# Patient Record
Sex: Female | Born: 1965 | Race: White | Hispanic: No | Marital: Married | Smoking: Current every day smoker
Health system: Southern US, Community
[De-identification: ages and names within clinical notes are randomized; demographics above are authoritative.]

## PROBLEM LIST (undated history)

## (undated) DIAGNOSIS — E119 Type 2 diabetes mellitus without complications: Secondary | ICD-10-CM

## (undated) DIAGNOSIS — E669 Obesity, unspecified: Secondary | ICD-10-CM

## (undated) DIAGNOSIS — Z9289 Personal history of other medical treatment: Secondary | ICD-10-CM

## (undated) DIAGNOSIS — I10 Essential (primary) hypertension: Secondary | ICD-10-CM

## (undated) DIAGNOSIS — Z0100 Encounter for examination of eyes and vision without abnormal findings: Secondary | ICD-10-CM

## (undated) DIAGNOSIS — E039 Hypothyroidism, unspecified: Secondary | ICD-10-CM

## (undated) DIAGNOSIS — E079 Disorder of thyroid, unspecified: Secondary | ICD-10-CM

## (undated) DIAGNOSIS — G4730 Sleep apnea, unspecified: Secondary | ICD-10-CM

## (undated) DIAGNOSIS — B019 Varicella without complication: Secondary | ICD-10-CM

## (undated) DIAGNOSIS — M1990 Unspecified osteoarthritis, unspecified site: Secondary | ICD-10-CM

## (undated) DIAGNOSIS — E785 Hyperlipidemia, unspecified: Secondary | ICD-10-CM

## (undated) HISTORY — DX: Hypothyroidism, unspecified: E03.9

## (undated) HISTORY — DX: Essential (primary) hypertension: I10

## (undated) HISTORY — DX: Obesity, unspecified: E66.9

## (undated) HISTORY — DX: Sleep apnea, unspecified: G47.30

## (undated) HISTORY — DX: Unspecified osteoarthritis, unspecified site: M19.90

## (undated) HISTORY — DX: Encounter for examination of eyes and vision without abnormal findings: Z01.00

## (undated) HISTORY — DX: Varicella without complication: B01.9

## (undated) HISTORY — DX: Type 2 diabetes mellitus without complications: E11.9

## (undated) HISTORY — DX: Hyperlipidemia, unspecified: E78.5

## (undated) HISTORY — PX: APPENDECTOMY: SHX54

## (undated) HISTORY — DX: Personal history of other medical treatment: Z92.89

---

## 2005-01-30 ENCOUNTER — Inpatient Hospital Stay: Payer: Self-pay | Admitting: Vascular Surgery

## 2005-07-28 ENCOUNTER — Ambulatory Visit: Payer: Self-pay | Admitting: Internal Medicine

## 2005-08-23 ENCOUNTER — Ambulatory Visit: Payer: Self-pay | Admitting: Internal Medicine

## 2005-09-13 ENCOUNTER — Ambulatory Visit: Payer: Self-pay | Admitting: Internal Medicine

## 2005-09-25 ENCOUNTER — Ambulatory Visit: Payer: Self-pay | Admitting: Internal Medicine

## 2005-11-30 ENCOUNTER — Ambulatory Visit: Payer: Self-pay | Admitting: Internal Medicine

## 2005-11-30 ENCOUNTER — Other Ambulatory Visit: Admission: RE | Admit: 2005-11-30 | Discharge: 2005-11-30 | Payer: Self-pay | Admitting: Internal Medicine

## 2005-11-30 LAB — CONVERTED CEMR LAB: Pap Smear: NORMAL

## 2005-12-01 ENCOUNTER — Encounter: Payer: Self-pay | Admitting: Internal Medicine

## 2006-01-05 ENCOUNTER — Ambulatory Visit: Payer: Self-pay | Admitting: Internal Medicine

## 2006-02-28 ENCOUNTER — Ambulatory Visit: Payer: Self-pay | Admitting: Internal Medicine

## 2006-03-29 ENCOUNTER — Ambulatory Visit: Payer: Self-pay | Admitting: Internal Medicine

## 2006-07-17 ENCOUNTER — Encounter (INDEPENDENT_AMBULATORY_CARE_PROVIDER_SITE_OTHER): Payer: Self-pay | Admitting: *Deleted

## 2006-07-17 LAB — CONVERTED CEMR LAB: Pap Smear: NORMAL

## 2006-08-13 ENCOUNTER — Emergency Department: Payer: Self-pay | Admitting: Emergency Medicine

## 2006-08-15 ENCOUNTER — Encounter: Payer: Self-pay | Admitting: Internal Medicine

## 2006-08-15 DIAGNOSIS — G4733 Obstructive sleep apnea (adult) (pediatric): Secondary | ICD-10-CM

## 2006-08-15 DIAGNOSIS — E039 Hypothyroidism, unspecified: Secondary | ICD-10-CM | POA: Insufficient documentation

## 2006-08-15 DIAGNOSIS — I10 Essential (primary) hypertension: Secondary | ICD-10-CM | POA: Insufficient documentation

## 2006-08-15 HISTORY — DX: Obstructive sleep apnea (adult) (pediatric): G47.33

## 2006-12-11 ENCOUNTER — Ambulatory Visit: Payer: Self-pay | Admitting: Family Medicine

## 2006-12-11 DIAGNOSIS — K5900 Constipation, unspecified: Secondary | ICD-10-CM | POA: Insufficient documentation

## 2007-01-14 ENCOUNTER — Ambulatory Visit: Payer: Self-pay | Admitting: Internal Medicine

## 2007-01-14 DIAGNOSIS — M1990 Unspecified osteoarthritis, unspecified site: Secondary | ICD-10-CM | POA: Insufficient documentation

## 2007-03-22 ENCOUNTER — Ambulatory Visit: Payer: Self-pay | Admitting: Internal Medicine

## 2007-03-22 DIAGNOSIS — E785 Hyperlipidemia, unspecified: Secondary | ICD-10-CM | POA: Insufficient documentation

## 2007-03-27 ENCOUNTER — Ambulatory Visit: Payer: Self-pay | Admitting: Internal Medicine

## 2007-04-04 ENCOUNTER — Ambulatory Visit: Payer: Self-pay | Admitting: Family Medicine

## 2007-04-05 ENCOUNTER — Encounter (INDEPENDENT_AMBULATORY_CARE_PROVIDER_SITE_OTHER): Payer: Self-pay | Admitting: Internal Medicine

## 2007-04-05 ENCOUNTER — Telehealth (INDEPENDENT_AMBULATORY_CARE_PROVIDER_SITE_OTHER): Payer: Self-pay | Admitting: Internal Medicine

## 2007-07-15 ENCOUNTER — Other Ambulatory Visit: Admission: RE | Admit: 2007-07-15 | Discharge: 2007-07-15 | Payer: Self-pay | Admitting: Internal Medicine

## 2007-07-15 ENCOUNTER — Encounter: Payer: Self-pay | Admitting: Internal Medicine

## 2007-07-15 ENCOUNTER — Ambulatory Visit: Payer: Self-pay | Admitting: Internal Medicine

## 2007-07-17 LAB — CONVERTED CEMR LAB
ALT: 27 units/L (ref 0–35)
AST: 20 units/L (ref 0–37)
Albumin: 4 g/dL (ref 3.5–5.2)
BUN: 11 mg/dL (ref 6–23)
Basophils Absolute: 0.6 10*3/uL — ABNORMAL HIGH (ref 0.0–0.1)
Basophils Relative: 5.5 % — ABNORMAL HIGH (ref 0.0–1.0)
Bilirubin, Direct: 0.1 mg/dL (ref 0.0–0.3)
CO2: 28 meq/L (ref 19–32)
Creatinine, Ser: 0.6 mg/dL (ref 0.4–1.2)
Creatinine,U: 34.8 mg/dL
Free T4: 0.6 ng/dL (ref 0.6–1.6)
HCT: 42.8 % (ref 36.0–46.0)
Hemoglobin: 13.9 g/dL (ref 12.0–15.0)
Hgb A1c MFr Bld: 6.9 % — ABNORMAL HIGH (ref 4.6–6.0)
Monocytes Absolute: 0.5 10*3/uL (ref 0.2–0.7)
Neutrophils Relative %: 55.5 % (ref 43.0–77.0)
Phosphorus: 4.2 mg/dL (ref 2.3–4.6)
Potassium: 3.6 meq/L (ref 3.5–5.1)
RBC: 5.06 M/uL (ref 3.87–5.11)
RDW: 16.3 % — ABNORMAL HIGH (ref 11.5–14.6)
Sodium: 137 meq/L (ref 135–145)
TSH: 5.64 microintl units/mL — ABNORMAL HIGH (ref 0.35–5.50)
Total Bilirubin: 0.4 mg/dL (ref 0.3–1.2)
Total CHOL/HDL Ratio: 6.1
Triglycerides: 142 mg/dL (ref 0–149)
VLDL: 28 mg/dL (ref 0–40)

## 2007-07-18 ENCOUNTER — Encounter (INDEPENDENT_AMBULATORY_CARE_PROVIDER_SITE_OTHER): Payer: Self-pay | Admitting: *Deleted

## 2007-08-08 ENCOUNTER — Telehealth (INDEPENDENT_AMBULATORY_CARE_PROVIDER_SITE_OTHER): Payer: Self-pay | Admitting: *Deleted

## 2007-09-02 ENCOUNTER — Ambulatory Visit: Payer: Self-pay | Admitting: Internal Medicine

## 2007-09-05 LAB — CONVERTED CEMR LAB
ALT: 28 units/L (ref 0–35)
Cholesterol: 158 mg/dL (ref 0–200)
Free T4: 0.9 ng/dL (ref 0.6–1.6)
TSH: 3.26 microintl units/mL (ref 0.35–5.50)

## 2007-09-16 ENCOUNTER — Telehealth (INDEPENDENT_AMBULATORY_CARE_PROVIDER_SITE_OTHER): Payer: Self-pay | Admitting: *Deleted

## 2007-12-04 ENCOUNTER — Encounter (INDEPENDENT_AMBULATORY_CARE_PROVIDER_SITE_OTHER): Payer: Self-pay | Admitting: *Deleted

## 2008-10-07 ENCOUNTER — Ambulatory Visit: Payer: Self-pay

## 2008-12-07 ENCOUNTER — Telehealth: Payer: Self-pay | Admitting: Internal Medicine

## 2008-12-07 ENCOUNTER — Ambulatory Visit: Payer: Self-pay | Admitting: Internal Medicine

## 2008-12-08 LAB — CONVERTED CEMR LAB
AST: 23 units/L (ref 0–37)
Albumin: 3.9 g/dL (ref 3.5–5.2)
Alkaline Phosphatase: 65 units/L (ref 39–117)
BUN: 19 mg/dL (ref 6–23)
Bilirubin, Direct: 0.1 mg/dL (ref 0.0–0.3)
CO2: 28 meq/L (ref 19–32)
Calcium: 9.7 mg/dL (ref 8.4–10.5)
Chloride: 105 meq/L (ref 96–112)
Cholesterol: 161 mg/dL (ref 0–200)
Creatinine, Ser: 0.6 mg/dL (ref 0.4–1.2)
Eosinophils Absolute: 0.3 10*3/uL (ref 0.0–0.7)
Eosinophils Relative: 2.7 % (ref 0.0–5.0)
Free T4: 0.8 ng/dL (ref 0.6–1.6)
HCT: 39.4 % (ref 36.0–46.0)
Hgb A1c MFr Bld: 6.2 % (ref 4.6–6.5)
LDL Cholesterol: 101 mg/dL — ABNORMAL HIGH (ref 0–99)
Lymphs Abs: 2.9 10*3/uL (ref 0.7–4.0)
MCHC: 35.1 g/dL (ref 30.0–36.0)
MCV: 85.9 fL (ref 78.0–100.0)
Microalb, Ur: 22.1 mg/dL — ABNORMAL HIGH (ref 0.0–1.9)
Monocytes Absolute: 0.1 10*3/uL (ref 0.1–1.0)
Platelets: 247 10*3/uL (ref 150.0–400.0)
TSH: 3.66 microintl units/mL (ref 0.35–5.50)
Total CHOL/HDL Ratio: 5
Total Protein: 7.7 g/dL (ref 6.0–8.3)
WBC: 11 10*3/uL — ABNORMAL HIGH (ref 4.5–10.5)

## 2009-05-10 ENCOUNTER — Telehealth: Payer: Self-pay | Admitting: Internal Medicine

## 2009-06-10 ENCOUNTER — Ambulatory Visit: Payer: Self-pay | Admitting: Internal Medicine

## 2009-06-10 ENCOUNTER — Other Ambulatory Visit: Admission: RE | Admit: 2009-06-10 | Discharge: 2009-06-10 | Payer: Self-pay | Admitting: Internal Medicine

## 2009-06-14 LAB — CONVERTED CEMR LAB
ALT: 37 units/L — ABNORMAL HIGH (ref 0–35)
Alkaline Phosphatase: 68 units/L (ref 39–117)
Basophils Relative: 0 % (ref 0.0–3.0)
Bilirubin, Direct: 0 mg/dL (ref 0.0–0.3)
Eosinophils Absolute: 0.2 10*3/uL (ref 0.0–0.7)
Eosinophils Relative: 2.5 % (ref 0.0–5.0)
Free T4: 0.7 ng/dL (ref 0.6–1.6)
Glucose, Bld: 123 mg/dL — ABNORMAL HIGH (ref 70–99)
Hgb A1c MFr Bld: 6.8 % — ABNORMAL HIGH (ref 4.6–6.5)
Lymphocytes Relative: 30.1 % (ref 12.0–46.0)
Monocytes Relative: 1.7 % — ABNORMAL LOW (ref 3.0–12.0)
Neutrophils Relative %: 65.7 % (ref 43.0–77.0)
Phosphorus: 4.5 mg/dL (ref 2.3–4.6)
Potassium: 3.7 meq/L (ref 3.5–5.1)
RBC: 4.51 M/uL (ref 3.87–5.11)
Sodium: 138 meq/L (ref 135–145)
Total Protein: 7 g/dL (ref 6.0–8.3)
WBC: 9.1 10*3/uL (ref 4.5–10.5)

## 2009-06-15 ENCOUNTER — Encounter: Payer: Self-pay | Admitting: Internal Medicine

## 2009-06-22 ENCOUNTER — Ambulatory Visit: Payer: Self-pay | Admitting: Family Medicine

## 2009-06-22 DIAGNOSIS — R079 Chest pain, unspecified: Secondary | ICD-10-CM | POA: Insufficient documentation

## 2009-10-21 ENCOUNTER — Ambulatory Visit: Payer: Self-pay | Admitting: Family Medicine

## 2009-10-27 ENCOUNTER — Ambulatory Visit: Payer: Self-pay | Admitting: Internal Medicine

## 2009-10-27 DIAGNOSIS — J449 Chronic obstructive pulmonary disease, unspecified: Secondary | ICD-10-CM | POA: Insufficient documentation

## 2009-11-02 ENCOUNTER — Ambulatory Visit: Payer: Self-pay | Admitting: Internal Medicine

## 2009-12-09 ENCOUNTER — Ambulatory Visit: Payer: Self-pay | Admitting: Internal Medicine

## 2009-12-10 LAB — CONVERTED CEMR LAB: Hgb A1c MFr Bld: 7.4 % — ABNORMAL HIGH (ref 4.6–6.5)

## 2010-05-19 ENCOUNTER — Ambulatory Visit
Admission: RE | Admit: 2010-05-19 | Discharge: 2010-05-19 | Payer: Self-pay | Source: Home / Self Care | Attending: Internal Medicine | Admitting: Internal Medicine

## 2010-05-19 DIAGNOSIS — R1011 Right upper quadrant pain: Secondary | ICD-10-CM | POA: Insufficient documentation

## 2010-05-19 LAB — CONVERTED CEMR LAB
Ketones, urine, test strip: NEGATIVE
Nitrite: NEGATIVE
Specific Gravity, Urine: 1.02

## 2010-05-22 HISTORY — PX: ABDOMINAL HYSTERECTOMY: SHX81

## 2010-05-24 ENCOUNTER — Ambulatory Visit: Payer: Self-pay | Admitting: Internal Medicine

## 2010-05-24 ENCOUNTER — Encounter: Payer: Self-pay | Admitting: Family Medicine

## 2010-06-13 ENCOUNTER — Ambulatory Visit
Admission: RE | Admit: 2010-06-13 | Discharge: 2010-06-13 | Payer: Self-pay | Source: Home / Self Care | Attending: Internal Medicine | Admitting: Internal Medicine

## 2010-06-13 ENCOUNTER — Other Ambulatory Visit: Payer: Self-pay | Admitting: Internal Medicine

## 2010-06-14 LAB — RENAL FUNCTION PANEL
Albumin: 4 g/dL (ref 3.5–5.2)
BUN: 15 mg/dL (ref 6–23)
Glucose, Bld: 122 mg/dL — ABNORMAL HIGH (ref 70–99)
Phosphorus: 4.3 mg/dL (ref 2.3–4.6)
Potassium: 4.2 mEq/L (ref 3.5–5.1)
Sodium: 140 mEq/L (ref 135–145)

## 2010-06-14 LAB — CBC WITH DIFFERENTIAL/PLATELET
Basophils Relative: 0.5 % (ref 0.0–3.0)
Lymphs Abs: 3 10*3/uL (ref 0.7–4.0)
MCHC: 34 g/dL (ref 30.0–36.0)
Monocytes Relative: 4.7 % (ref 3.0–12.0)
RBC: 4.55 Mil/uL (ref 3.87–5.11)
WBC: 9.3 10*3/uL (ref 4.5–10.5)

## 2010-06-14 LAB — LIPID PANEL
HDL: 38.5 mg/dL — ABNORMAL LOW (ref >39.00)
Triglycerides: 201 mg/dL — ABNORMAL HIGH (ref 0.0–149.0)
VLDL: 40.2 mg/dL — ABNORMAL HIGH (ref 0.0–40.0)

## 2010-06-14 LAB — HEPATIC FUNCTION PANEL
Total Bilirubin: 0.3 mg/dL (ref 0.3–1.2)
Total Protein: 6.8 g/dL (ref 6.0–8.3)

## 2010-06-21 NOTE — Assessment & Plan Note (Signed)
Summary: CPX/BIR   Vital Signs:  Patient profile:   45 year old female Weight:      205 pounds BMI:     40.52 Temp:     98.8 degrees F oral Pulse rate:   76 / minute Pulse rhythm:   regular BP sitting:   130 / 80  (left arm) Cuff size:   large  Vitals Entered By: Mervin Hack CMA Duncan Dull) (June 10, 2009 3:04 PM) CC: adult physical   History of Present Illness: Doing fairly well in general  Ran out of pravastatin While waiting for Rx, she was off for 1 week Chronic constipation which was better restarted the med, constipation returned discussed restarting but without the aspirin  Sugars generally under 100 fasting no hypoglycemia No sores or pain in feet  Allergies: No Known Drug Allergies  Past History:  Past medical, surgical, family and social histories (including risk factors) reviewed for relevance to current acute and chronic problems.  Past Medical History: Reviewed history from 12/07/2008 and no changes required. Hypertension Hypothyroidism Osteoarthritis NIDDM with nephropathy Hyperlipidemia Obstructive sleep apnea  Past Surgical History: Reviewed history from 08/15/2006 and no changes required. Appendectomy 9/06 C-Section x2 Obstipation 2004 Tubal ligation  Family History: Reviewed history from 08/15/2006 and no changes required. Dad has HTN Mom healthy Pat GF died@72  of lungcancer CAD in [place] DM Mat [place] and Mat aunts/uncles 1 brother/1 sister  Social History: Reviewed history from 01/14/2007 and no changes required. 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 Eyes:  Denies double vision and vision loss-1  eye. ENT:  Denies decreased hearing and ringing in ears; recent tooth pulled--otherwise okay. CV:  Complains of palpitations; denies chest pain or discomfort, difficulty breathing at night, difficulty breathing while lying down, fainting, lightheadness, and shortness of breath with exertion; occ palpitations--often in bed. Resp:  Denies cough and shortness of breath. GI:  Complains of constipation and indigestion; denies abdominal pain, bloody stools, dark tarry stools, nausea, and vomiting; occ mild upset stomach. GU:  Denies dysuria and incontinence; mild dyspareunia--better with lubricants. MS:  Complains of joint pain; denies joint swelling; mild shoulder pain  still--better since working out more. Derm:  Complains of rash; denies lesion(s); occ mild itchy reash on forearms. Neuro:  Denies headaches, numbness, tingling, and weakness. Psych:  Denies anxiety and depression. Heme:  Denies abnormal bruising and enlarge lymph nodes. Allergy:  Denies seasonal allergies and sneezing.  Physical Exam  General:  alert and normal appearance.   Eyes:  pupils equal, pupils round, pupils reactive to light, and no optic disk abnormalities.   Ears:  R ear normal and L ear normal.   Mouth:  no erythema and no lesions.   Neck:  supple, no masses, no thyromegaly, no carotid bruits, and no cervical lymphadenopathy.   Breasts:  no masses, no abnormal thickening, no tenderness, and no adenopathy.   Lungs:  normal respiratory effort and normal breath sounds.   Heart:  normal rate, regular rhythm, no murmur, and no gallop.   Abdomen:  soft, non-tender, and no masses.   Genitalia:  normal introitus, no external lesions, no vaginal or cervical lesions, and no friaility or hemorrhage.   Limited but negative bimanual exam Msk:  no joint tenderness and no joint swelling.   Pulses:  1+ in feet Extremities:  no edema Neurologic:  alert & oriented X3, strength normal in all extremities, and gait normal.   Skin:   no rashes and no suspicious lesions.   Psych:  normally interactive, good eye contact, not anxious appearing, and not depressed appearing.    Diabetes Management Exam:    Foot Exam (with socks and/or shoes not present):       Sensory-Pinprick/Light touch:          Left medial foot (L-4): normal          Left dorsal foot (L-5): normal          Left lateral foot (S-1): normal          Right medial foot (L-4): normal          Right dorsal foot (L-5): normal          Right lateral foot (S-1): normal       Inspection:          Left foot: abnormal             Comments: early callous          Right foot: abnormal             Comments: early callous       Nails:          Left foot: normal          Right foot: normal   Impression & Recommendations:  Problem # 1:  PREVENTIVE HEALTH CARE (ICD-V70.0) Assessment Comment Only working on fitness counselled prefers to wait on mammograms  Problem # 2:  DIABETES MELLITUS, TYPE II WITH NEPHROP 2001 (ICD-250.00) Assessment: Unchanged  seems to still be okay will check labs if urine microal worse, will increase lisinopril  Her updated medication list for this problem includes:    Lantus 100 Unit/ml Soln (Insulin glargine) ..... Inject 65 units  subcutaneously once a day    Lisinopril 10 Mg Tabs (Lisinopril) .Marland Kitchen... Take 1 tablet by mouth once a day    Metformin Hcl 500 Mg Tabs (Metformin hcl) .Marland Kitchen... 1 tab three times a day before meals  Labs Reviewed: Creat: 0.6 (12/07/2008)     Last Eye Exam: normal (11/23/2008) Reviewed HgBA1c results: 6.2 (12/07/2008)  6.9 (07/15/2007)  Orders: TLB-Microalbumin/Creat Ratio, Urine (82043-MALB) TLB-A1C / Hgb A1C (Glycohemoglobin) (83036-A1C)  Problem # 3:  HYPERTENSION (ICD-401.9) Assessment: Unchanged  may need increased lisinopril  Her updated medication list for this problem includes:    Lisinopril 10 Mg Tabs (Lisinopril) .Marland Kitchen... Take 1 tablet by mouth once a day    Hydrochlorothiazide 25 Mg  Tabs (Hydrochlorothiazide) .Marland Kitchen... 1 tab daily for high blood pressure  BP today: 130/80 Prior BP: 110/68 (12/07/2008)  Labs Reviewed: K+: 4.2 (12/07/2008) Creat: : 0.6 (12/07/2008)   Chol: 161 (12/07/2008)   HDL: 34.40 (12/07/2008)   LDL: 101 (12/07/2008)   TG: 127.0 (12/07/2008)  Orders: TLB-Renal Function Panel (80069-RENAL) TLB-CBC Platelet - w/Differential (85025-CBCD) TLB-Hepatic/Liver Function Pnl (80076-HEPATIC) Venipuncture ([address])  Problem # 4:  HYPERLIPIDEMIA (ICD-272.4) Assessment: Comment Only check next time is restarting the pravastatin  Her updated medication list for this problem includes:    Pravastatin Sodium 40 Mg Tabs (Pravastatin sodium) .Marland Kitchen... 1 tab daily  Complete Medication List: 1)  Levothyroxine Sodium 100 Mcg Tabs (Levothyroxine sodium) .... Take 1 tablet once a day 2)  Lantus 100 Unit/ml Soln (Insulin glargine) .... Inject 65 units  subcutaneously once a day 3)  Lisinopril 10 Mg Tabs (Lisinopril) .... Take 1 tablet by mouth once a day 4)  Hydrochlorothiazide 25 Mg Tabs (Hydrochlorothiazide) .Marland Kitchen.. 1 tab daily for high blood pressure 5)  Accu-chek Aviva Strp (Glucose blood) .... Check sugar daily or as directed 6)  Bd Insulin Syringe Ultrafine 29g X 1/2" 0.5 Ml Misc (Insulin syringe-needle u-100) .... Use as directed 7)  Pravastatin Sodium 40 Mg Tabs (Pravastatin sodium) .Marland Kitchen.. 1 tab daily 8)  Metformin Hcl 500 Mg Tabs (Metformin hcl) .Marland Kitchen.. 1 tab three times a day before meals 9)  Onetouch Ultra Test Strp (Glucose blood) .... Pt tests three times a day dx: 250.00 10)  Onetouch Ultra System W/device Kit (Blood glucose monitoring suppl) .... Use as directed dx: 250.00 11)  Onetouch Ultrasoft Lancets Misc (Lancets) .... Patient tests three times a day dx: 250.00  Other Orders: TLB-T4 (Thyrox), Free [phone]) TLB-TSH (Thyroid Stimulating Hormone) [id number])  Patient Instructions: 1)  Please schedule a follow-up appointment in 6 months .   Prescriptions: LANTUS 100 UNIT/ML SOLN (INSULIN GLARGINE) Inject 65 units  subcutaneously once a day  #2000units x 12   Entered and Authorized by:   Cindee Salt MD   Signed by:   Cindee Salt MD on 06/10/2009   Method used:   Electronically to        [facility] [address] (retail)       [address], [address]       [place], [place]  [address]       Ph: [phone]       Fax: [phone]   RxID:   [id number]   Current Allergies (reviewed today): No known allergies

## 2010-06-21 NOTE — Assessment & Plan Note (Signed)
Summary: follow up   Vital Signs:  Patient profile:   45 year old female Weight:      210.13 pounds Temp:     98.5 degrees F oral Pulse rate:   88 / minute Pulse rhythm:   regular BP sitting:   118 / 64  (left arm) Cuff size:   large  Vitals Entered By: Janee Morn CMA (December 09, 2009 3:27 PM) CC: f/u   History of Present Illness: Feels better No breathing problems  Did smoke a couple of cigarettes with stress (daughter snuck out at night to meet boy) Nothing bad but upsetting Smoked 2 cigarettes and now back to being off  Breathing now back to normal No cough  Sugars have been up a bit lately Got one of 385-- at bedtime AM usually still under 120 No symptomatic hypoglycemia Due for eye exam No foot pain or sores  No headaches No chest pain No sig edema  Allergies: No Known Drug Allergies  Past History:  Past medical, surgical, family and social histories (including risk factors) reviewed for relevance to current acute and chronic problems.  Past Medical History: Reviewed history from 10/27/2009 and no changes required. Hypertension Hypothyroidism Osteoarthritis NIDDM with nephropathy Hyperlipidemia Obstructive sleep apnea COPD  Past Surgical History: Reviewed history from 08/15/2006 and no changes required. Appendectomy 9/06 C-Section x2 Obstipation 2004 Tubal ligation  Family History: Reviewed history from 08/15/2006 and no changes required. Dad has HTN Mom healthy Pat GF died@72  of lungcancer CAD in [place] DM [place] and Mat aunts/uncles 1 brother/1 sister  Social History: Reviewed history from 01/14/2007 and no changes required. 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   Neck:  supple, no masses, no thyromegaly, no carotid bruits, and no cervical lymphadenopathy.   Lungs:  normal respiratory effort and normal breath sounds.   Heart:  normal rate, regular rhythm, no murmur, and no gallop.   Pulses:  1+ in feet Extremities:  no edema Psych:  normally interactive, good eye contact, not anxious appearing, and not depressed appearing.    Diabetes Management Exam:    Foot Exam (with socks and/or shoes not present):       Sensory-Pinprick/Light touch:          Left medial foot (L-4): normal          Left dorsal foot (L-5): normal          Left lateral foot (S-1): normal          Right medial foot (L-4): normal          Right dorsal foot (L-5): normal          Right lateral foot (S-1): normal       Inspection:          Left foot: normal          Right foot: normal       Nails:          Left foot: normal          Right foot: normal   Impression & Recommendations:  Problem # 1:  COPD (ICD-496) Assessment Improved feeling better now basically off cigarettes  Her updated medication list for this problem includes:  Ventolin Hfa 108 (90 Base) Mcg/act Aers (Albuterol sulfate) .Marland Kitchen... 2 puffs every 4 hours as needed wheezing or shortness of breath  Problem # 2:  DIABETES MELLITUS, TYPE II WITH NEPHROP 2001 (ICD-250.00) Assessment: Unchanged  seems to have been briefly worse on prednisone but better now fastings are fine discussed lifestyle  Her updated medication list for this problem includes:    Lantus 100 Unit/ml Soln (Insulin glargine) ..... Inject 65 units  subcutaneously once a day    Lisinopril 10 Mg Tabs (Lisinopril) .Marland Kitchen... Take 1 tablet by mouth once a day    Metformin Hcl 500 Mg Tabs (Metformin hcl) .Marland Kitchen... 1 tab three times a day before meals  Labs Reviewed: Creat: 0.8 (06/10/2009)     Last Eye Exam: normal (11/23/2008) Reviewed HgBA1c results: 6.8 (06/10/2009)  6.2  (12/07/2008)  Orders: Venipuncture (08657) TLB-A1C / Hgb A1C (Glycohemoglobin) (83036-A1C)  Problem # 3:  HYPERTENSION (ICD-401.9) Assessment: Unchanged BP still fine no changes despite elevated microal  Her updated medication list for this problem includes:    Lisinopril 10 Mg Tabs (Lisinopril) .Marland Kitchen... Take 1 tablet by mouth once a day    Hydrochlorothiazide 25 Mg Tabs (Hydrochlorothiazide) .Marland Kitchen... 1 tab daily for high blood pressure  BP today: 118/64 Prior BP: 110/70 (11/02/2009)  Labs Reviewed: K+: 3.7 (06/10/2009) Creat: : 0.8 (06/10/2009)   Chol: 161 (12/07/2008)   HDL: 34.40 (12/07/2008)   LDL: 101 (12/07/2008)   TG: 127.0 (12/07/2008)  Problem # 4:  HYPERLIPIDEMIA (ICD-272.4) Assessment: Unchanged reasonable control  Her updated medication list for this problem includes:    Pravastatin Sodium 40 Mg Tabs (Pravastatin sodium) .Marland Kitchen... 1 tab daily  Labs Reviewed: SGOT: 26 (06/10/2009)   SGPT: 37 (06/10/2009)   HDL:34.40 (12/07/2008), 30.3 (09/02/2007)  LDL:101 (12/07/2008), 102 (09/02/2007)  Chol:161 (12/07/2008), 158 (09/02/2007)  Trig:127.0 (12/07/2008), 130 (09/02/2007)  Complete Medication List: 1)  Levothyroxine Sodium 100 Mcg Tabs (Levothyroxine sodium) .... Take 1 tablet once a day 2)  Lantus 100 Unit/ml Soln (Insulin glargine) .... Inject 65 units  subcutaneously once a day 3)  Lisinopril 10 Mg Tabs (Lisinopril) .... Take 1 tablet by mouth once a day 4)  Hydrochlorothiazide 25 Mg Tabs (Hydrochlorothiazide) .Marland Kitchen.. 1 tab daily for high blood pressure 5)  Bd Insulin Syringe Ultrafine 29g X 1/2" 0.5 Ml Misc (Insulin syringe-needle u-100) .... Use as directed 6)  Pravastatin Sodium 40 Mg Tabs (Pravastatin sodium) .Marland Kitchen.. 1 tab daily 7)  Metformin Hcl 500 Mg Tabs (Metformin hcl) .Marland Kitchen.. 1 tab three times a day before meals 8)  Onetouch Ultra Test Strp (Glucose blood) .... Pt tests three times a day dx: 250.00 9)  Onetouch Ultra System W/device Kit (Blood glucose monitoring suppl)  .... Use as directed dx: 250.00 10)  Onetouch Ultrasoft Lancets Misc (Lancets) .... Patient tests three times a day dx: 250.00 11)  Ventolin Hfa 108 (90 Base) Mcg/act Aers (Albuterol sulfate) .... 2 puffs every 4 hours as needed wheezing or shortness of breath 12)  Tramadol Hcl 50 Mg Tabs (Tramadol hcl) .Marland Kitchen.. 1 tab by mouth three times a day as needed for cough or chest pain 13)  Insulin Syringe 31g X 5/16" 1 Ml Misc (Insulin syringe-needle u-100) .... Use 1 daily as directed  Patient Instructions: 1)  Please schedule a follow-up appointment in 6 months for physical Prescriptions: INSULIN SYRINGE 31G X 5/16" 1 ML MISC (INSULIN SYRINGE-NEEDLE U-100) use 1 daily as directed  #100 x 5   Entered and Authorized by:   Cindee Salt MD  Signed by:   Cindee Salt MD on 12/09/2009   Method used:   Electronically to        [facility]* (retail)       [address]       [place], [place]  [address]       Ph: [phone]       Fax: [phone]   RxID:   [id number]   Current Allergies (reviewed today): No known allergies

## 2010-06-21 NOTE — Letter (Signed)
Summary: Results Follow up Letter  [facility]  [address] [place], [place] [address]   Phone: [phone]  Fax: [phone]    06/15/2009 MRN: [id number]  [facility] [address] [place], [place]  [address]  Dear Ms. BLACK,  The following are the results of your recent test(s):  Test         Result    Pap Smear:        Normal __X___  Not Normal _____ Comments: ______________________________________________________ Cholesterol: LDL(Bad cholesterol):         Your goal is less than:         HDL (Good cholesterol):       Your goal is more than: Comments:  ______________________________________________________ Mammogram:        Normal _____  Not Normal _____ Comments:  ___________________________________________________________________ Hemoccult:        Normal _____  Not normal _______ Comments:    _____________________________________________________________________ Other Tests:    We routinely do not discuss normal results over the telephone.  If you desire a copy of the results, or you have any questions about this information we can discuss them at your next office visit.   Sincerely,      Tillman Abide, MD

## 2010-06-21 NOTE — Assessment & Plan Note (Signed)
Summary: pain on left side, below breast/ alc   Vital Signs:  Patient profile:   45 year old female Height:      59.75 inches Weight:      208.13 pounds BMI:     41.14 Temp:     98.4 degrees F oral Pulse rate:   84 / minute Pulse rhythm:   regular BP sitting:   102 / 70  (left arm) Cuff size:   large  Vitals Entered By: Delilah Shan CMA Duncan Dull) (June 22, 2009 10:57 AM) CC: Pain on left side below breast   History of Present Illness: 45 yo presents with left sided rib pain x 2 weeks. Occurred immediatley after coughing violently when she was moving things in her home (reaction to dust).  Immediately felt the pain on left ribcage.  No shortness of breath.  Pain is worsened with deep breathing, coughing, and laughing.  Feels it is not getting any better. Taking Ibuprofen with no relief of symptoms.  Current Medications (verified): 1)  Levothyroxine Sodium 100 Mcg Tabs (Levothyroxine Sodium) .... Take 1 Tablet Once A Day 2)  Lantus 100 Unit/ml Soln (Insulin Glargine) .... Inject 65 Units  Subcutaneously Once A Day 3)  Lisinopril 10 Mg Tabs (Lisinopril) .... Take 1 Tablet By Mouth Once A Day 4)  Hydrochlorothiazide 25 Mg  Tabs (Hydrochlorothiazide) .Marland Kitchen.. 1 Tab Daily For High Blood Pressure 5)  Bd Insulin Syringe Ultrafine 29g X 1/2" 0.5 Ml Misc (Insulin Syringe-Needle U-100) .... Use As Directed 6)  Pravastatin Sodium 40 Mg Tabs (Pravastatin Sodium) .Marland Kitchen.. 1 Tab Daily 7)  Metformin Hcl 500 Mg Tabs (Metformin Hcl) .Marland Kitchen.. 1 Tab Three Times A Day Before Meals 8)  Onetouch Ultra Test  Strp (Glucose Blood) .... Pt Tests Three Times A Day Dx: 250.00 9)  Onetouch Ultra System W/device Kit (Blood Glucose Monitoring Suppl) .... Use As Directed Dx: 250.00 10)  Onetouch Ultrasoft Lancets  Misc (Lancets) .... Patient Tests Three Times A Day Dx: 250.00 11)  Percocet 5-325 Mg Tabs (Oxycodone-Acetaminophen) .Marland Kitchen.. 1 Tab As Needed Every 6 Hours For Pain.  Allergies (verified): No Known Drug  Allergies  Review of Systems      See HPI General:  Denies chills and fever. CV:  Denies chest pain or discomfort, difficulty breathing at night, and difficulty breathing while lying down. Resp:  Complains of chest pain with inspiration; denies cough, shortness of breath, sputum productive, and wheezing.  Physical Exam  General:  alert and normal appearance.   No increased WOB. Chest Wall:  TTP over left inferior rib cage Lungs:  normal respiratory effort and normal breath sounds.   Heart:  normal rate, regular rhythm, no murmur, and no gallop.   Abdomen:  soft, non-tender, and no masses.   Psych:  normally interactive, good eye contact, not anxious appearing, and not depressed appearing.     Impression & Recommendations:  Problem # 1:  RIB PAIN, LEFT SIDED (ICD-786.50) Assessment New Likely uncomplicated rib fracture.  Explained chest xray would not change outcome given physical exam findings are very reassuring (normal lung exam).  Will treat with short course of narcotics as needed for 2 weeks.  If symptoms persist beyond 2-3 weeks or if she develops shortness of breath, advsied to follow up.  Complete Medication List: 1)  Levothyroxine Sodium 100 Mcg Tabs (Levothyroxine sodium) .... Take 1 tablet once a day 2)  Lantus 100 Unit/ml Soln (Insulin glargine) .... Inject 65 units  subcutaneously once a day 3)  Lisinopril 10 Mg Tabs (Lisinopril) .... Take 1 tablet by mouth once a day 4)  Hydrochlorothiazide 25 Mg Tabs (Hydrochlorothiazide) .Marland Kitchen.. 1 tab daily for high blood pressure 5)  Bd Insulin Syringe Ultrafine 29g X 1/2" 0.5 Ml Misc (Insulin syringe-needle u-100) .... Use as directed 6)  Pravastatin Sodium 40 Mg Tabs (Pravastatin sodium) .Marland Kitchen.. 1 tab daily 7)  Metformin Hcl 500 Mg Tabs (Metformin hcl) .Marland Kitchen.. 1 tab three times a day before meals 8)  Onetouch Ultra Test Strp (Glucose blood) .... Pt tests three times a day dx: 250.00 9)  Onetouch Ultra System W/device Kit (Blood glucose  monitoring suppl) .... Use as directed dx: 250.00 10)  Onetouch Ultrasoft Lancets Misc (Lancets) .... Patient tests three times a day dx: 250.00 11)  Percocet 5-325 Mg Tabs (Oxycodone-acetaminophen) .Marland Kitchen.. 1 tab as needed every 6 hours for pain. Prescriptions: PERCOCET 5-325 MG TABS (OXYCODONE-ACETAMINOPHEN) 1 tab as needed every 6 hours for pain.  #30 x 0   Entered and Authorized by:   Ruthe Mannan MD   Signed by:   Ruthe Mannan MD on 06/22/2009   Method used:   Print then Give to Patient   RxID:   [id number]   Current Allergies (reviewed today): No known allergies

## 2010-06-21 NOTE — Assessment & Plan Note (Signed)
Summary: S.O.B.,COUGH/CLE   Vital Signs:  Patient profile:   45 year old female Weight:      208 pounds O2 Sat:      96 % on Room air Temp:     98.4 degrees F oral Pulse rate:   82 / minute Pulse rhythm:   regular Resp:     20 per minute BP sitting:   110 / 60  (left arm) Cuff size:   large  Vitals Entered By: Mervin Hack CMA (AAMA) (October 27, 2009 1:01 PM)  O2 Flow:  Room air CC: cough, SOB   History of Present Illness: Took the antibioitic took 2 days off after visit  still SOB has RUQ pain to back--"like a hot poker" in past couple of days not related to eating Not clearly pleuritic  Low grade fever last week intermittent -- up to 99.5 Cough with very little sputum  has mild head congestion no sore throat or ear pain   No cigarettes since last night only a few yesterday  Allergies: No Known Drug Allergies  Past History:  Past medical, surgical, family and social histories (including risk factors) reviewed for relevance to current acute and chronic problems.  Past Medical History: Hypertension Hypothyroidism Osteoarthritis NIDDM with nephropathy Hyperlipidemia Obstructive sleep apnea COPD  Past Surgical History: Reviewed history from 08/15/2006 and no changes required. Appendectomy 9/06 C-Section x2 Obstipation 2004 Tubal ligation  Family History: Reviewed history from 08/15/2006 and no changes required. Dad has HTN Mom healthy Pat GF died@72  of lungcancer CAD in [place] and Mat aunts/uncles 1 brother/1 sister  Social History: Reviewed history from 01/14/2007 and no changes required. 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  NAD Tight cough Head:  no sinus tenderness Ears:  R ear normal and L ear normal.     Nose:  mild congestion Mouth:  no erythema and no exudates.   Neck:  supple, no masses, and no cervical lymphadenopathy.   Lungs:  normal respiratory effort, no intercostal retractions, no accessory muscle use, no dullness, no crackles, and no wheezes.   Slightly tight but no sig wheezing   Impression & Recommendations:  Problem # 1:  BRONCHITIS- ACUTE (ICD-466.0) Assessment Comment Only ongoing cough not clearly infectious still has the zithromax in system  Her updated medication list for this problem includes:    Ventolin Hfa 108 (90 Base) Mcg/act Aers (Albuterol sulfate) .Marland Kitchen... 2 puffs every 4 hours as needed wheezing or shortness of breath  Problem # 2:  COPD (ICD-496) Assessment: New  will try prednisone trial recheck next week must stay off cigarettes---start patch  Her updated medication list for this problem includes:    Ventolin Hfa 108 (90 Base) Mcg/act Aers (Albuterol sulfate) .Marland Kitchen... 2 puffs every 4 hours as needed wheezing or shortness of breath  Orders: Spirometry w/Graph (94010)  Complete Medication List: 1)  Levothyroxine Sodium 100 Mcg Tabs (Levothyroxine sodium) .... Take 1 tablet once a day 2)  Lantus 100 Unit/ml Soln (Insulin glargine) .... Inject 65 units  subcutaneously once a day 3)  Lisinopril 10 Mg Tabs (Lisinopril) .... Take 1 tablet by mouth once a day 4)  Hydrochlorothiazide 25 Mg Tabs (Hydrochlorothiazide) .Marland Kitchen.. 1 tab daily for high blood pressure 5)  Bd Insulin Syringe Ultrafine 29g X 1/2" 0.5  Ml Misc (Insulin syringe-needle u-100) .... Use as directed 6)  Pravastatin Sodium 40 Mg Tabs (Pravastatin sodium) .Marland Kitchen.. 1 tab daily 7)  Metformin Hcl 500 Mg Tabs (Metformin hcl) .Marland Kitchen.. 1 tab three times a day before meals 8)  Onetouch Ultra Test Strp (Glucose blood) .... Pt tests three times a day dx: 250.00 9)  Onetouch Ultra System W/device Kit (Blood glucose monitoring suppl) .... Use as directed dx: 250.00 10)  Onetouch Ultrasoft Lancets Misc (Lancets) ....  Patient tests three times a day dx: 250.00 11)  Ventolin Hfa 108 (90 Base) Mcg/act Aers (Albuterol sulfate) .... 2 puffs every 4 hours as needed wheezing or shortness of breath 12)  Prednisone 20 Mg Tabs (Prednisone) .... 2 tabs daily for 5 days, then 1 tab daily for 5 days 13)  Tramadol Hcl 50 Mg Tabs (Tramadol hcl) .Marland Kitchen.. 1 tab by mouth three times a day as needed for cough or chest pain  Other Orders: CXR- 2view (CXR)  Patient Instructions: 1)  Please schedule a follow-up appointment in 1 week Prescriptions: TRAMADOL HCL 50 MG TABS (TRAMADOL HCL) 1 tab by mouth three times a day as needed for cough or chest pain  #30 x 0   Entered and Authorized by:   Cindee Salt MD   Signed by:   Cindee Salt MD on 10/27/2009   Method used:   Electronically to        [facility]* (retail)       [address]       [place], [place]  [address]       Ph: [phone]       Fax: [phone]   RxID:   [id number] PREDNISONE 20 MG TABS (PREDNISONE) 2 tabs daily for 5 days, then 1 tab daily for 5 days  #15 x 0   Entered and Authorized by:   Cindee Salt MD   Signed by:   Cindee Salt MD on 10/27/2009   Method used:   Electronically to        [facility]* (retail)       [address]       [place], [place]  [address]       Ph: [phone]       Fax: [phone]   RxID:   [id number]   Current Allergies (reviewed today): No known allergies

## 2010-06-21 NOTE — Assessment & Plan Note (Signed)
Summary: SEVERE CHEST CONGESTION/DLO   Vital Signs:  Patient profile:   45 year old female Height:      59.75 inches Weight:      207.0 pounds BMI:     40.91 O2 Sat:      96 % on Room air Temp:     98.7 degrees F oral Pulse rate:   93 / minute Pulse rhythm:   regular Resp:     20 per minute BP sitting:   110 / 60  (left arm) Cuff size:   large  Vitals Entered By: Benny Lennert CMA (AAMA) (October 21, 2009 10:50 AM)  O2 Flow:  Room air  History of Present Illness: Chief complaint severe chest congestion  the patient is a 45 year old white female with a history of 30+ pack years who presents with a  one week history of  significantly productive cough and  some shortness of breath and dramatic coughing at nighttime not helped by routine over-the-counter cough medications. She is not running a fever. No nausea, vomiting, diarrhea. There is some sore throat and postnasal drainage.  Does feel more labored breathing.  Review of systems: As above, and the fevers, sweats, chills,  nausea. No rash.  GEN: A and O x 3. WDWN. NAD.    ENT: Nose clear, ext NML.  No LAD.  No JVD.  TM's clear. Oropharynx clear.  PULM: Normal WOB, no distress. No crackles, wheezes, rhonch - sounds somewhat tight on exam with less airflow but no formal wheezing CV: RRR, no M/G/R, No rubs, No JVD.   ABD: S, NT, ND, + BS. No rebound. No guarding. No HSM.   EXT: warm and well-perfused, No c/c/e. PSYCH: Pleasant and conversant.   Allergies: No Known Drug Allergies  Past History:  Past medical, surgical, family and social histories (including risk factors) reviewed, and no changes noted (except as noted below).  Past Medical History: Reviewed history from 12/07/2008 and no changes required. Hypertension Hypothyroidism Osteoarthritis NIDDM with nephropathy Hyperlipidemia Obstructive sleep apnea  Past Surgical History: Reviewed history from 08/15/2006 and no changes required. Appendectomy 9/06 C-Section  x2 Obstipation 2004 Tubal ligation   Family History: Reviewed history from 08/15/2006 and no changes required. Dad has HTN Mom healthy Pat GF died@72  of lungcancer CAD in [place] and Mat aunts/uncles 1 brother/1 sister  Social History: Reviewed history from 01/14/2007 and no changes required. 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 Suspect with 30+ history of smoking,, patient may have COPD at least a mild level  Her updated medication list for this problem includes:    Azithromycin 250 Mg Tabs (Azithromycin) .Marland Kitchen... 2 by  mouth today and then 1 daily for 4 days    Ventolin Hfa 108 (90 Base) Mcg/act Aers (Albuterol sulfate) .Marland Kitchen... 2 puffs q 4 hours as needed wheezing or sob  Complete Medication List: 1)  Levothyroxine Sodium 100 Mcg Tabs (Levothyroxine sodium) .... Take 1 tablet once a day 2)  Lantus 100 Unit/ml Soln (Insulin glargine) .... Inject 65 units  subcutaneously once a day 3)  Lisinopril 10 Mg Tabs (Lisinopril) .... Take 1 tablet by mouth once a day 4)  Hydrochlorothiazide 25 Mg Tabs (Hydrochlorothiazide) .Marland Kitchen.. 1 tab daily for high blood pressure  5)  Bd Insulin Syringe Ultrafine 29g X 1/2" 0.5 Ml Misc (Insulin syringe-needle u-100) .... Use as directed 6)  Pravastatin Sodium 40 Mg Tabs (Pravastatin sodium) .Marland Kitchen.. 1 tab daily 7)  Metformin Hcl 500 Mg Tabs (Metformin hcl) .Marland Kitchen.. 1 tab three times a day before meals 8)  Onetouch Ultra Test Strp (Glucose blood) .... Pt tests three times a day dx: 250.00 9)  Onetouch Ultra System W/device Kit (Blood glucose monitoring suppl) .... Use as directed dx: 250.00 10)  Onetouch Ultrasoft Lancets Misc (Lancets)  .... Patient tests three times a day dx: 250.00 11)  Azithromycin 250 Mg Tabs (Azithromycin) .... 2 by  mouth today and then 1 daily for 4 days 12)  Ventolin Hfa 108 (90 Base) Mcg/act Aers (Albuterol sulfate) .... 2 puffs q 4 hours as needed wheezing or sob Prescriptions: VENTOLIN HFA 108 (90 BASE) MCG/ACT AERS (ALBUTEROL SULFATE) 2 puffs q 4 hours as needed wheezing or sob  #1 x 2   Entered and Authorized by:   Hannah Beat MD   Signed by:   Hannah Beat MD on 10/21/2009   Method used:   Electronically to        [facility]* (retail)       [address]       [place], [place]  [address]       Ph: [phone]       Fax: [phone]   RxID:   [id number] AZITHROMYCIN 250 MG  TABS (AZITHROMYCIN) 2 by  mouth today and then 1 daily for 4 days  #6 x 0   Entered and Authorized by:   Hannah Beat MD   Signed by:   Hannah Beat MD on 10/21/2009   Method used:   Electronically to        [facility]* (retail)       [address]       [place], [place]  [address]       Ph: [phone]       Fax: [phone]   RxID:   [id number]   Prior Medications: LEVOTHYROXINE SODIUM 100 MCG TABS (LEVOTHYROXINE SODIUM) Take 1 tablet once a day LANTUS 100 UNIT/ML SOLN (INSULIN GLARGINE) Inject 65 units  subcutaneously once a day LISINOPRIL 10 MG TABS (LISINOPRIL) Take 1 tablet by mouth once a day HYDROCHLOROTHIAZIDE 25 MG  TABS (HYDROCHLOROTHIAZIDE) 1 tab daily for high blood pressure BD INSULIN SYRINGE ULTRAFINE 29G X 1/2" 0.5 ML MISC (INSULIN SYRINGE-NEEDLE U-100) use as directed PRAVASTATIN SODIUM 40 MG TABS (PRAVASTATIN SODIUM) 1 tab daily METFORMIN HCL 500 MG TABS (METFORMIN HCL) 1 tab three times a day before meals ONETOUCH ULTRA TEST  STRP (GLUCOSE BLOOD) pt tests three times a day dx: 250.00 ONETOUCH ULTRA SYSTEM W/DEVICE KIT (BLOOD GLUCOSE MONITORING SUPPL) use as directed dx: 250.00 ONETOUCH ULTRASOFT LANCETS  MISC (LANCETS) patient tests three times a day dx:  250.00 Current Allergies: No known allergies

## 2010-06-21 NOTE — Assessment & Plan Note (Signed)
Summary: ROA 1 WKS CYD   Vital Signs:  Patient profile:   45 year old female Weight:      207 pounds O2 Sat:      95 % on Room air Temp:     98.5 degrees F oral Pulse rate:   90 / minute Pulse rhythm:   regular BP sitting:   110 / 70  (left arm) Cuff size:   large  Vitals Entered By: Mervin Hack CMA [facility]) (November 02, 2009 11:34 AM)  O2 Flow:  Room air CC: 1 week follow-up   History of Present Illness: Feels better  Still tired Still has sensatin "like a baseball" along right lower ribs some spasm there also  NO fever Breathing feels better Not much cough no wheezing now  Has been wearing patch---at least during the day still will have a morning cigarette but cutting down------discussed using nicotine lozenge instead  having some sleep problems with the prednisone Has had some low sugar reactions--had to cut back on her insulin a little--now back up though  Allergies: No Known Drug Allergies  Past History:  Past medical, surgical, family and social histories (including risk factors) reviewed for relevance to current acute and chronic problems.  Past Medical History: Reviewed history from 10/27/2009 and no changes required. Hypertension Hypothyroidism Osteoarthritis NIDDM with nephropathy Hyperlipidemia Obstructive sleep apnea COPD  Past Surgical History: Reviewed history from 08/15/2006 and no changes required. Appendectomy 9/06 C-Section x2 Obstipation 2004 Tubal ligation  Family History: Reviewed history from 08/15/2006 and no changes required. Dad has HTN Mom healthy Pat GF died@72  of lungcancer CAD in [place] and Mat aunts/uncles 1 brother/1 sister  Social History: Reviewed history from 01/14/2007 and no changes required. 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   Neck:  supple, no masses, and no cervical lymphadenopathy.   Lungs:  normal respiratory effort, no intercostal retractions, no accessory muscle use, normal breath sounds, no crackles, and no wheezes.     Impression & Recommendations:  Problem # 1:  BRONCHITIS- ACUTE (ICD-466.0) Assessment Improved seems to have mostly resolved CXR consistent with interstitial pneumonia so avelox used--has a couple more days  Her updated medication list for this problem includes:    Ventolin Hfa 108 (90 Base) Mcg/act Aers (Albuterol sulfate) .Marland Kitchen... 2 puffs every 4 hours as needed wheezing or shortness of breath    Avelox 400 Mg Tabs (Moxifloxacin hcl) .Marland Kitchen... Take 1 by mouth once daily  Problem # 2:  COPD (ICD-496) Assessment: Improved finishing prednisone almost quit smoking---will try to use patch at night &/or just use lozenge in AM  Her updated medication list for this problem includes:    Ventolin Hfa 108 (90 Base) Mcg/act Aers (Albuterol sulfate) .Marland Kitchen... 2 puffs every 4 hours as needed wheezing or shortness of breath  Problem # 3:  SLEEP APNEA (ICD-780.57) Assessment: Comment Only Rx written for mask  Complete Medication List: 1)  Levothyroxine Sodium 100 Mcg Tabs (Levothyroxine sodium) .... Take 1 tablet once a day 2)  Lantus 100 Unit/ml Soln (Insulin glargine) .... Inject 65 units  subcutaneously once a day 3)  Lisinopril 10 Mg Tabs (Lisinopril) .... Take 1 tablet by mouth once a day 4)  Hydrochlorothiazide 25 Mg Tabs (Hydrochlorothiazide) .Marland Kitchen.. 1 tab daily for high  blood pressure 5)  Bd Insulin Syringe Ultrafine 29g X 1/2" 0.5 Ml Misc (Insulin syringe-needle u-100) .... Use as directed 6)  Pravastatin Sodium 40 Mg Tabs (Pravastatin sodium) .Marland Kitchen.. 1 tab daily 7)  Metformin Hcl 500 Mg Tabs (Metformin hcl) .Marland Kitchen.. 1 tab three times a day before meals 8)  Onetouch Ultra Test Strp (Glucose blood) .... Pt tests three  times a day dx: 250.00 9)  Onetouch Ultra System W/device Kit (Blood glucose monitoring suppl) .... Use as directed dx: 250.00 10)  Onetouch Ultrasoft Lancets Misc (Lancets) .... Patient tests three times a day dx: 250.00 11)  Ventolin Hfa 108 (90 Base) Mcg/act Aers (Albuterol sulfate) .... 2 puffs every 4 hours as needed wheezing or shortness of breath 12)  Prednisone 20 Mg Tabs (Prednisone) .... 2 tabs daily for 5 days, then 1 tab daily for 5 days 13)  Tramadol Hcl 50 Mg Tabs (Tramadol hcl) .Marland Kitchen.. 1 tab by mouth three times a day as needed for cough or chest pain 14)  Avelox 400 Mg Tabs (Moxifloxacin hcl) .... Take 1 by mouth once daily  Patient Instructions: 1)  Please keep July follow up appt  Current Allergies (reviewed today): No known allergies

## 2010-06-23 NOTE — Assessment & Plan Note (Signed)
Summary: CPX W/PAP???/RBH   Vital Signs:  Patient profile:   45 year old female Weight:      211 pounds Temp:     98.5 degrees F oral Pulse rate:   81 / minute Pulse rhythm:   regular BP sitting:   114 / 72  (left arm) Cuff size:   large  Vitals Entered By: Mervin Hack CMA Duncan Dull) (June 13, 2010 3:08 PM) CC: adult physical   History of Present Illness: DOing okay  Frustrated by repetitive RUQ pain WOrk up has been unrevealing May be focal neuropathy in spinal nerve  Checks sugars most mornings mostly  ~100 No hypoglycemic reactions Due for eye exam  Using "estroblend" for hot flashes (black cohosh) Irregular menses for the past 18 months Skips periods then may be for a long time  Allergies: No Known Drug Allergies  Past History:  Past medical, surgical, family and social histories (including risk factors) reviewed for relevance to current acute and chronic problems.  Past Medical History: Reviewed history from 10/27/2009 and no changes required. Hypertension Hypothyroidism Osteoarthritis NIDDM with nephropathy Hyperlipidemia Obstructive sleep apnea COPD  Past Surgical History: Reviewed history from 08/15/2006 and no changes required. Appendectomy 9/06 C-Section x2 Obstipation 2004 Tubal ligation  Family History: Reviewed history from 08/15/2006 and no changes required. Dad has HTN Mom healthy Pat GF died@72  of lungcancer CAD in [place] DM Mat [place] and Mat aunts/uncles 1 brother/1 sister  Social History: Reviewed history from 01/14/2007 and no changes required. 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 Eyes:  Denies double vision and vision loss-1 eye. ENT:  Denies decreased hearing and ringing in ears; Teeth  okay--regular with dentist. CV:  Denies chest pain or discomfort, difficulty breathing at night, difficulty breathing while lying down, fainting, lightheadness, palpitations, and shortness of breath with exertion. Resp:  Denies cough and shortness of breath. GI:  Denies abdominal pain, bloody stools, dark tarry stools, indigestion, nausea, and vomiting; chronic constipation but doing well now. GU:  Complains of incontinence; denies dysuria; occ stress incontinence---wears pad Does breast exam no sexual problems. MS:  Complains of joint pain; denies joint swelling; ongoing left> right shoulder pain uses ibuprofen 600mg  at bedtime. Derm:  Complains of dryness and itching; denies lesion(s) and rash; itchy, dry skin in winter has electric heat---discussed humidifier. Neuro:  Complains of headaches; denies numbness, tingling, and weakness. Heme:  Denies abnormal bruising and enlarge lymph nodes. Allergy:  Denies seasonal allergies and sneezing.  Physical Exam  General:  alert and normal appearance.   Eyes:  pupils equal, pupils round, pupils reactive to light, and no optic disk abnormalities.   Ears:  R ear normal and L ear normal.   Mouth:  no erythema, no exudates, and no lesions.   Neck:  supple, no masses, no thyromegaly, no carotid bruits, and no cervical lymphadenopathy.   Breasts:  no masses, no abnormal thickening, no tenderness, and no adenopathy.   Lungs:  normal respiratory effort, no intercostal retractions, no accessory muscle use, and normal breath sounds.   Heart:  normal rate, regular rhythm, no murmur, and no gallop.   Abdomen:  soft and non-tender.   Msk:  no joint tenderness and no joint swelling.   Pulses:  1+ in feet Extremities:  no edema Neurologic:  alert & oriented X3, strength normal  in all extremities, and gait normal.   Skin:  no rashes, no suspicious lesions, and no ulcerations.   Psych:  normally interactive, good eye contact, not anxious appearing, and not  depressed appearing.    Diabetes Management Exam:    Foot Exam (with socks and/or shoes not present):       Sensory-Pinprick/Light touch:          Left medial foot (L-4): normal          Left dorsal foot (L-5): normal          Left lateral foot (S-1): normal          Right medial foot (L-4): normal          Right dorsal foot (L-5): normal          Right lateral foot (S-1): normal       Inspection:          Left foot: normal          Right foot: normal       Nails:          Left foot: normal          Right foot: normal   Impression & Recommendations:  Problem # 1:  PREVENTIVE HEALTH CARE (ICD-V70.0) Assessment Comment Only discussed mammo--she prefers to wait for age 62 PAP due in 2 years discussed fitness  Problem # 2:  DIABETES MELLITUS, TYPE II WITH NEPHROP 2001 (ICD-250.00) Assessment: Unchanged  seems to have reasonable control will check labs  Her updated medication list for this problem includes:    Lantus 100 Unit/ml Soln (Insulin glargine) ..... Inject 65 units  subcutaneously once a day    Lisinopril 10 Mg Tabs (Lisinopril) .Marland Kitchen... Take 1 tablet by mouth once a day    Metformin Hcl 500 Mg Tabs (Metformin hcl) .Marland Kitchen... 1 tab three times a day before meals  Labs Reviewed: Creat: 0.8 (06/10/2009)     Last Eye Exam: normal (11/23/2008) Reviewed HgBA1c results: 7.4 (12/09/2009)  6.8 (06/10/2009)  Orders: TLB-A1C / Hgb A1C (Glycohemoglobin) (83036-A1C) TLB-Microalbumin/Creat Ratio, Urine (82043-MALB)  Problem # 3:  HYPERTENSION (ICD-401.9) Assessment: Unchanged  good control no changes needed  Her updated medication list for this problem includes:    Lisinopril 10 Mg Tabs (Lisinopril) .Marland Kitchen... Take 1 tablet by mouth once a day    Hydrochlorothiazide 25 Mg Tabs (Hydrochlorothiazide) .Marland Kitchen... 1 tab daily for high blood pressure  BP today: 114/72 Prior BP: 120/70 (05/19/2010)  Labs Reviewed: K+: 3.7 (06/10/2009) Creat: : 0.8 (06/10/2009)   Chol: 161 (12/07/2008)    HDL: 34.40 (12/07/2008)   LDL: 101 (12/07/2008)   TG: 127.0 (12/07/2008)  Orders: TLB-Renal Function Panel (80069-RENAL) TLB-CBC Platelet - w/Differential (85025-CBCD) Venipuncture (16109)  Problem # 4:  HYPERLIPIDEMIA (ICD-272.4) Assessment: Unchanged  no myalgias due for labs  Her updated medication list for this problem includes:    Pravastatin Sodium 40 Mg Tabs (Pravastatin sodium) .Marland Kitchen... 1 tab daily  Labs Reviewed: SGOT: 26 (06/10/2009)   SGPT: 37 (06/10/2009)   HDL:34.40 (12/07/2008), 30.3 (09/02/2007)  LDL:101 (12/07/2008), 102 (09/02/2007)  Chol:161 (12/07/2008), 158 (09/02/2007)  Trig:127.0 (12/07/2008), 130 (09/02/2007)  Orders: TLB-Lipid Panel (80061-LIPID) TLB-Hepatic/Liver Function Pnl (80076-HEPATIC)  Problem # 5:  HYPOTHYROIDISM (ICD-244.9) Assessment: Unchanged  seems euthyroid  Her updated medication list for this problem includes:    Levothyroxine Sodium 100 Mcg Tabs (Levothyroxine sodium) .Marland Kitchen... Take 1 tablet once a day  Labs Reviewed: TSH: 2.49 (06/10/2009)    HgBA1c: 7.4 (12/09/2009) Chol: 161 (  12/07/2008)   HDL: 34.40 (12/07/2008)   LDL: 101 (12/07/2008)   TG: 127.0 (12/07/2008)  Orders: TLB-T4 (Thyrox), Free [phone]) TLB-TSH (Thyroid Stimulating Hormone) (84443-TSH)  Problem # 6:  ABDOMINAL PAIN, RIGHT UPPER QUADRANT (ICD-789.01) Assessment: Comment Only could be neuropathic  Complete Medication List: 1)  Levothyroxine Sodium 100 Mcg Tabs (Levothyroxine sodium) .... Take 1 tablet once a day 2)  Lantus 100 Unit/ml Soln (Insulin glargine) .... Inject 65 units  subcutaneously once a day 3)  Lisinopril 10 Mg Tabs (Lisinopril) .... Take 1 tablet by mouth once a day 4)  Hydrochlorothiazide 25 Mg Tabs (Hydrochlorothiazide) .Marland Kitchen.. 1 tab daily for high blood pressure 5)  Pravastatin Sodium 40 Mg Tabs (Pravastatin sodium) .Marland Kitchen.. 1 tab daily 6)  Metformin Hcl 500 Mg Tabs (Metformin hcl) .Marland Kitchen.. 1 tab three times a day before meals 7)  Onetouch Ultra Test  Strp (Glucose blood) .... Pt tests three times a day dx: 250.00 8)  Onetouch Ultrasoft Lancets Misc (Lancets) .... Patient tests three times a day dx: 250.00 9)  Ventolin Hfa 108 (90 Base) Mcg/act Aers (Albuterol sulfate) .... 2 puffs every 4 hours as needed wheezing or shortness of breath 10)  Tramadol Hcl 50 Mg Tabs (Tramadol hcl) .Marland Kitchen.. 1 tab by mouth three times a day as needed for cough or chest pain 11)  Insulin Syringe 31g X 5/16" 1 Ml Misc (Insulin syringe-needle u-100) .... Use 1 daily as directed  Patient Instructions: 1)  Please schedule a follow-up appointment in 6 months .    Orders Added: 1)  Est. Patient 40-64 years [99396] 2)  TLB-A1C / Hgb A1C (Glycohemoglobin) [83036-A1C] 3)  TLB-Microalbumin/Creat Ratio, Urine [82043-MALB] 4)  TLB-T4 (Thyrox), Free [40981-XB1Y] 5)  TLB-TSH (Thyroid Stimulating Hormone) [84443-TSH] 6)  TLB-Lipid Panel [80061-LIPID] 7)  TLB-Hepatic/Liver Function Pnl [80076-HEPATIC] 8)  TLB-Renal Function Panel [80069-RENAL] 9)  TLB-CBC Platelet - w/Differential [85025-CBCD] 10)  Venipuncture [78295]    Current Allergies (reviewed today): No known allergies

## 2010-06-23 NOTE — Assessment & Plan Note (Signed)
Summary: BURNING IN RIGHT RIB AND BACK/JRR   Vital Signs:  Patient profile:   45 year old female Weight:      209 pounds Temp:     98.4 degrees F oral BP sitting:   120 / 70  (left arm) Cuff size:   large  Vitals Entered By: Mervin Hack CMA Duncan Dull) (May 19, 2010 3:05 PM) CC: right side rib pain   History of Present Illness: Started with cold 3 weeks ago Doesn't really feel bad in that way but feels stuffy in head still  Started with pain in RUQ After eating---"pulsating" and radiates to back not there in AM upon arising Feels like "a softball" in there  No fever   Allergies: No Known Drug Allergies  Past History:  Past medical, surgical, family and social histories (including risk factors) reviewed for relevance to current acute and chronic problems.  Past Medical History: Reviewed history from 10/27/2009 and no changes required. Hypertension Hypothyroidism Osteoarthritis NIDDM with nephropathy Hyperlipidemia Obstructive sleep apnea COPD  Past Surgical History: Reviewed history from 08/15/2006 and no changes required. Appendectomy 9/06 C-Section x2 Obstipation 2004 Tubal ligation  Family History: Reviewed history from 08/15/2006 and no changes required. Dad has HTN Mom healthy Pat GF died@72  of lungcancer CAD in [place] DM Mat [place] and Mat aunts/uncles 1 brother/1 sister  Social History: Reviewed history from 01/14/2007 and no changes required. 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 Color seems more like clay No period since September--had been regular till then Just bought a house--very excited about this  Physical Exam  General:  alert and normal appearance.   Mouth:  slight pharyngeal injection without  exudate Neck:  supple, no masses, and no cervical lymphadenopathy.   Lungs:  normal respiratory effort, no intercostal retractions, no accessory muscle use, and normal breath sounds.   Abdomen:  soft and normal bowel sounds.   Mild RUQ tenderness Extremities:  no edema   Impression & Recommendations:  Problem # 1:  ABDOMINAL PAIN, RIGHT UPPER QUADRANT (ICD-789.01) Assessment New  history fairly classic for gall bladder but did have similar symptoms (though less) about 2 years ago and ultrasound was normal  will advise low fat diet check ultrasound surgery eval if indicated (Byrnett/Sankar) consider HIDA scan if persistent pain and ultrasound normal  Orders: Radiology Referral (Radiology) UA Dipstick w/o Micro (manual) (82956)  Problem # 2:  URI (ICD-465.9) Assessment: New seems viral though sore throat prominent last week and some findings today will treat amoxicillin next week if not improved  Complete Medication List: 1)  Levothyroxine Sodium 100 Mcg Tabs (Levothyroxine sodium) .... Take 1 tablet once a day 2)  Lantus 100 Unit/ml Soln (Insulin glargine) .... Inject 65 units  subcutaneously once a day 3)  Lisinopril 10 Mg Tabs (Lisinopril) .... Take 1 tablet by mouth once a day 4)  Hydrochlorothiazide 25 Mg Tabs (Hydrochlorothiazide) .Marland Kitchen.. 1 tab daily for high blood pressure 5)  Pravastatin Sodium 40 Mg Tabs (Pravastatin sodium) .Marland Kitchen.. 1 tab daily 6)  Metformin Hcl 500 Mg Tabs (Metformin hcl) .Marland Kitchen.. 1 tab three times a day before meals 7)  Onetouch Ultra Test Strp (Glucose blood) .... Pt tests three times a day dx: 250.00 8)  Onetouch Ultrasoft Lancets Misc (Lancets) .... Patient tests three times a day dx: 250.00 9)  Ventolin Hfa 108 (90 Base) Mcg/act Aers (Albuterol sulfate) .... 2 puffs every 4 hours as needed wheezing or shortness of breath 10)  Tramadol Hcl 50 Mg Tabs (Tramadol hcl) .Marland Kitchen.. 1 tab by mouth three times a day as needed for cough or chest pain 11)  Insulin Syringe  31g X 5/16" 1 Ml Misc (Insulin syringe-needle u-100) .... Use 1 daily as directed  Patient Instructions: 1)  Please keep regular appt 2)  Set up the abdominal ultrasound   Orders Added: 1)  Est. Patient Level IV [13086] 2)  Radiology Referral [Radiology] 3)  UA Dipstick w/o Micro (manual) [81002]    Current Allergies (reviewed today): No known allergies   Laboratory Results   Urine Tests  Date/Time Received: May 19, 2010 3:11 PM Date/Time Reported: May 19, 2010 3:11 PM  Routine Urinalysis   Color: yellow Appearance: Hazy Glucose: negative   (Normal Range: Negative) Bilirubin: negative   (Normal Range: Negative) Ketone: negative   (Normal Range: Negative) Spec. Gravity: 1.020   (Normal Range: 1.003-1.035) Blood: moderate   (Normal Range: Negative) pH: 6.5   (Normal Range: 5.0-8.0) Protein: negative   (Normal Range: Negative) Urobilinogen: 0.2   (Normal Range: 0-1) Nitrite: negative   (Normal Range: Negative) Leukocyte Esterace: negative   (Normal Range: Negative)

## 2010-07-25 ENCOUNTER — Other Ambulatory Visit (INDEPENDENT_AMBULATORY_CARE_PROVIDER_SITE_OTHER): Payer: 59

## 2010-07-25 ENCOUNTER — Encounter (INDEPENDENT_AMBULATORY_CARE_PROVIDER_SITE_OTHER): Payer: Self-pay | Admitting: *Deleted

## 2010-07-25 ENCOUNTER — Other Ambulatory Visit: Payer: Self-pay

## 2010-07-25 DIAGNOSIS — E039 Hypothyroidism, unspecified: Secondary | ICD-10-CM

## 2010-07-26 ENCOUNTER — Other Ambulatory Visit: Payer: Self-pay | Admitting: Internal Medicine

## 2010-07-26 DIAGNOSIS — E039 Hypothyroidism, unspecified: Secondary | ICD-10-CM

## 2010-08-27 ENCOUNTER — Other Ambulatory Visit: Payer: Self-pay | Admitting: Internal Medicine

## 2010-08-31 ENCOUNTER — Other Ambulatory Visit: Payer: Self-pay | Admitting: Internal Medicine

## 2010-09-27 ENCOUNTER — Ambulatory Visit: Payer: Self-pay | Admitting: Family Medicine

## 2010-10-05 ENCOUNTER — Ambulatory Visit: Payer: Self-pay | Admitting: Obstetrics and Gynecology

## 2010-10-13 ENCOUNTER — Ambulatory Visit: Payer: Self-pay | Admitting: Obstetrics and Gynecology

## 2010-11-04 ENCOUNTER — Other Ambulatory Visit: Payer: Self-pay | Admitting: *Deleted

## 2010-11-04 MED ORDER — GLUCOSE BLOOD VI STRP: ORAL_STRIP | Status: DC

## 2010-12-12 ENCOUNTER — Ambulatory Visit: Payer: Self-pay | Admitting: Internal Medicine

## 2012-05-09 ENCOUNTER — Ambulatory Visit: Payer: Self-pay | Admitting: Orthopedic Surgery

## 2012-06-21 DIAGNOSIS — Z9289 Personal history of other medical treatment: Secondary | ICD-10-CM

## 2012-06-21 HISTORY — DX: Personal history of other medical treatment: Z92.89

## 2012-08-16 DIAGNOSIS — Z9289 Personal history of other medical treatment: Secondary | ICD-10-CM

## 2012-08-16 HISTORY — DX: Personal history of other medical treatment: Z92.89

## 2013-05-12 ENCOUNTER — Ambulatory Visit: Payer: Self-pay | Admitting: Obstetrics and Gynecology

## 2014-06-26 LAB — HEPATIC FUNCTION PANEL
ALK PHOS: 69 U/L (ref 25–125)
ALT: 50 U/L — AB (ref 7–35)
AST: 24 U/L (ref 13–35)
Bilirubin, Total: 0.3 mg/dL

## 2014-06-26 LAB — BASIC METABOLIC PANEL
BUN: 14 mg/dL (ref 4–21)
CREATININE: 0.6 mg/dL (ref 0.5–1.1)
GLUCOSE: 143 mg/dL
Potassium: 4.6 mmol/L (ref 3.4–5.3)
Sodium: 137 mmol/L (ref 137–147)

## 2014-06-26 LAB — LIPID PANEL
Cholesterol: 256 mg/dL — AB (ref 0–200)
HDL: 45 mg/dL (ref 35–70)
LDL Cholesterol: 178 mg/dL
Triglycerides: 166 mg/dL — AB (ref 40–160)

## 2014-06-26 LAB — HEMOGLOBIN A1C

## 2014-07-09 ENCOUNTER — Ambulatory Visit: Payer: Self-pay | Admitting: Nurse Practitioner

## 2014-10-05 DIAGNOSIS — E1159 Type 2 diabetes mellitus with other circulatory complications: Secondary | ICD-10-CM | POA: Insufficient documentation

## 2014-10-05 DIAGNOSIS — E782 Mixed hyperlipidemia: Secondary | ICD-10-CM | POA: Insufficient documentation

## 2014-10-05 DIAGNOSIS — I10 Essential (primary) hypertension: Secondary | ICD-10-CM | POA: Insufficient documentation

## 2014-10-05 DIAGNOSIS — E119 Type 2 diabetes mellitus without complications: Secondary | ICD-10-CM | POA: Insufficient documentation

## 2014-10-05 DIAGNOSIS — R7401 Elevation of levels of liver transaminase levels: Secondary | ICD-10-CM | POA: Insufficient documentation

## 2014-10-05 DIAGNOSIS — R740 Nonspecific elevation of levels of transaminase and lactic acid dehydrogenase [LDH]: Secondary | ICD-10-CM

## 2014-10-07 ENCOUNTER — Other Ambulatory Visit: Payer: Self-pay | Admitting: Internal Medicine

## 2014-10-07 DIAGNOSIS — R740 Nonspecific elevation of levels of transaminase and lactic acid dehydrogenase [LDH]: Principal | ICD-10-CM

## 2014-10-07 DIAGNOSIS — R7401 Elevation of levels of liver transaminase levels: Secondary | ICD-10-CM

## 2014-10-15 ENCOUNTER — Ambulatory Visit
Admission: RE | Admit: 2014-10-15 | Discharge: 2014-10-15 | Disposition: A | Discharge status: Home / Self Care | Payer: BLUE CROSS/BLUE SHIELD | Source: Ambulatory Visit | Attending: Internal Medicine | Admitting: Internal Medicine

## 2014-10-15 DIAGNOSIS — R740 Nonspecific elevation of levels of transaminase and lactic acid dehydrogenase [LDH]: Secondary | ICD-10-CM | POA: Diagnosis present

## 2014-10-15 DIAGNOSIS — R7401 Elevation of levels of liver transaminase levels: Secondary | ICD-10-CM

## 2014-11-09 DIAGNOSIS — K7581 Nonalcoholic steatohepatitis (NASH): Secondary | ICD-10-CM | POA: Insufficient documentation

## 2015-01-16 ENCOUNTER — Encounter: Payer: Self-pay | Admitting: Emergency Medicine

## 2015-01-16 ENCOUNTER — Emergency Department
Admission: EM | Admit: 2015-01-16 | Discharge: 2015-01-16 | Disposition: A | Discharge status: Home / Self Care | Payer: BLUE CROSS/BLUE SHIELD | Attending: Emergency Medicine | Admitting: Emergency Medicine

## 2015-01-16 DIAGNOSIS — E119 Type 2 diabetes mellitus without complications: Secondary | ICD-10-CM | POA: Insufficient documentation

## 2015-01-16 DIAGNOSIS — M5441 Lumbago with sciatica, right side: Secondary | ICD-10-CM | POA: Insufficient documentation

## 2015-01-16 DIAGNOSIS — I10 Essential (primary) hypertension: Secondary | ICD-10-CM | POA: Diagnosis not present

## 2015-01-16 DIAGNOSIS — Z720 Tobacco use: Secondary | ICD-10-CM | POA: Insufficient documentation

## 2015-01-16 DIAGNOSIS — M545 Low back pain: Secondary | ICD-10-CM | POA: Diagnosis present

## 2015-01-16 HISTORY — DX: Disorder of thyroid, unspecified: E07.9

## 2015-01-16 HISTORY — DX: Type 2 diabetes mellitus without complications: E11.9

## 2015-01-16 MED ORDER — KETOROLAC TROMETHAMINE 10 MG PO TABS: 10.0000 mg | ORAL_TABLET | Freq: Four times a day (QID) | ORAL | Status: DC | PRN

## 2015-01-16 MED ORDER — FENTANYL CITRATE (PF) 100 MCG/2ML IJ SOLN
50.0000 ug | Freq: Once | INTRAMUSCULAR | Status: AC
  Administered 2015-01-16: 50 ug via INTRAMUSCULAR
  Filled 2015-01-16: qty 2

## 2015-01-16 MED ORDER — OXYCODONE-ACETAMINOPHEN 5-325 MG PO TABS: 1.0000 | ORAL_TABLET | Freq: Four times a day (QID) | ORAL | Status: DC | PRN

## 2015-01-16 MED ORDER — OXYCODONE HCL 5 MG PO TABS
5.0000 mg | ORAL_TABLET | Freq: Once | ORAL | Status: AC
  Administered 2015-01-16: 5 mg via ORAL
  Filled 2015-01-16: qty 1

## 2015-01-16 MED ORDER — PREDNISONE 10 MG (21) PO TBPK: ORAL_TABLET | ORAL | Status: DC

## 2015-01-16 MED ORDER — KETOROLAC TROMETHAMINE 60 MG/2ML IM SOLN
60.0000 mg | Freq: Once | INTRAMUSCULAR | Status: AC
  Administered 2015-01-16: 60 mg via INTRAMUSCULAR
  Filled 2015-01-16: qty 2

## 2015-01-16 MED ORDER — DIAZEPAM 5 MG PO TABS
5.0000 mg | ORAL_TABLET | Freq: Once | ORAL | Status: AC
  Administered 2015-01-16: 5 mg via ORAL
  Filled 2015-01-16: qty 1

## 2015-01-16 NOTE — ED Notes (Signed)
Right lower back pain rad into hip and leg.

## 2015-01-16 NOTE — ED Notes (Signed)
Pt with 8/10 lower back pain s/p fall several months ago. Denies re-injuring back. Appears to be in acute distress

## 2015-01-16 NOTE — ED Provider Notes (Signed)
[facility] Emergency Department Provider Note ____________________________________________  Time seen: Approximately 1:50 PM  I have reviewed the triage vital signs and the nursing notes.   HISTORY  Chief Complaint Back Pain   HPI ROBIE OATS is a 49 y.o. female who presents to the emergency department for evaluation of back pain. She states that she slipped and fell in a pool a few months ago and landed on her right side. She's had some pain in that area since that time. She was seen at primary care and given some medication and had an x-ray which was negative. She denies new injury. She states that the pain became suddenly worse yesterday and this morning. She's had no relief with Flexeril.She denies loss of bowel or bladder control.   Past Medical History  Diagnosis Date  . Diabetes mellitus without complication   . Thyroid disease     Patient Active Problem List   Diagnosis Date Noted  . ABDOMINAL PAIN, RIGHT UPPER QUADRANT 05/19/2010  . COPD 10/27/2009  . RIB PAIN, LEFT SIDED 06/22/2009  . HYPERLIPIDEMIA 03/22/2007  . OSTEOARTHRITIS 01/14/2007  . CONSTIPATION 12/11/2006  . HYPOTHYROIDISM 08/15/2006  . HYPERTENSION 08/15/2006  . SLEEP APNEA 08/15/2006    Past Surgical History  Procedure Laterality Date  . Appendectomy    . C seaction      Current Outpatient Rx  Name  Route  Sig  Dispense  Refill  . Blood Glucose Monitoring Suppl (ONE TOUCH ULTRA SYSTEM KIT) W/DEVICE KIT      USE AS DIRECTED   1 each   1   . glucose blood (ONE TOUCH ULTRA TEST) test strip      Use as instructed   100 each   11     Check blood sugar 3 times daily as directed   . ketorolac (TORADOL) 10 MG tablet   Oral   Take 1 tablet (10 mg total) by mouth every 6 (six) hours as needed.   20 tablet   0   . oxyCODONE-acetaminophen (ROXICET) 5-325 MG per tablet   Oral   Take 1 tablet by mouth every 6 (six) hours as needed.   9 tablet   0   .  predniSONE (STERAPRED UNI-PAK 21 TAB) 10 MG (21) TBPK tablet      Take 6 tablets on day 1 Take 5 tablets on day 2 Take 4 tablets on day 3 Take 3 tablets on day 4 Take 2 tablets on day 5 Take 1 tablet on day 6   21 tablet   0     Allergies Review of patient's allergies indicates no known allergies.  History reviewed. No pertinent family history.  Social History Social History  Substance Use Topics  . Smoking status: Current Every Day Smoker  . Smokeless tobacco: None  . Alcohol Use: No    Review of Systems Constitutional: No recent illness. Eyes: No visual changes. ENT: No sore throat. Cardiovascular: Denies chest pain or palpitations. Respiratory: Denies shortness of breath. Gastrointestinal: No abdominal pain.  Genitourinary: Negative for dysuria. Musculoskeletal: Pain in right lower back with radiation into the right leg Skin: Negative for rash. Neurological: Negative for headaches, focal weakness or numbness. 10-point ROS otherwise negative.  ____________________________________________   PHYSICAL EXAM:  VITAL SIGNS: ED Triage Vitals  Enc Vitals Group     BP 01/16/15 1328 146/72 mmHg     Pulse Rate 01/16/15 1328 100     Resp 01/16/15 1328 20     Temp  01/16/15 1328 98.2 F (36.8 C)     Temp Source 01/16/15 1328 Oral     SpO2 01/16/15 1328 98 %     Weight 01/16/15 1328 172 lb (78.019 kg)     Height 01/16/15 1328 _0  (1.473 m)     Head Cir --      Peak Flow --      Pain Score 01/16/15 1326 10     Pain Loc --      Pain Edu? --      Excl. in [place]? --     Constitutional: Alert and oriented. Well appearing and in no acute distress. Eyes: Conjunctivae are normal. EOMI. Head: Atraumatic. Nose: No congestion/rhinnorhea. Neck: No stridor.  Respiratory: Normal respiratory effort.   Musculoskeletal: Leg raise on the right positive at about 35. Neurologic:  Normal speech and language. No gross focal neurologic deficits are appreciated. Speech is normal.  No gait instability. Skin:  Skin is warm, dry and intact. Atraumatic. Psychiatric: Mood and affect are normal. Speech and behavior are normal.  ____________________________________________   LABS (all labs ordered are listed, but only abnormal results are displayed)  Labs Reviewed - No data to display ____________________________________________  RADIOLOGY  Not indicated ____________________________________________   PROCEDURES  Procedure(s) performed: None   ____________________________________________   INITIAL IMPRESSION / ASSESSMENT AND PLAN / ED COURSE  Pertinent labs & imaging results that were available during my care of the patient were reviewed by me and considered in my medical decision making (see chart for details).  IM Toradol and by mouth Valium given in the emergency department with some relief.   She will follow up with her primary care provider. She was advised to return to the emergency pertinent for symptoms that change or worsen if she is unable schedule an appointment. ____________________________________________   FINAL CLINICAL IMPRESSION(S) / ED DIAGNOSES  Final diagnoses:  Acute back pain with sciatica, right       Victorino Dike, FNP 01/16/15 1513  Lisa Roca, MD 01/17/15 1501

## 2015-01-18 ENCOUNTER — Other Ambulatory Visit: Payer: Self-pay | Admitting: Physician Assistant

## 2015-01-18 DIAGNOSIS — M79604 Pain in right leg: Secondary | ICD-10-CM

## 2015-01-18 DIAGNOSIS — M545 Low back pain, unspecified: Secondary | ICD-10-CM

## 2015-01-21 ENCOUNTER — Ambulatory Visit
Admission: RE | Admit: 2015-01-21 | Discharge: 2015-01-21 | Disposition: A | Discharge status: Home / Self Care | Payer: BLUE CROSS/BLUE SHIELD | Source: Ambulatory Visit | Attending: Physician Assistant | Admitting: Physician Assistant

## 2015-01-21 DIAGNOSIS — M4806 Spinal stenosis, lumbar region: Secondary | ICD-10-CM | POA: Insufficient documentation

## 2015-01-21 DIAGNOSIS — M5126 Other intervertebral disc displacement, lumbar region: Secondary | ICD-10-CM | POA: Diagnosis not present

## 2015-01-21 DIAGNOSIS — M5441 Lumbago with sciatica, right side: Secondary | ICD-10-CM | POA: Diagnosis present

## 2015-01-21 DIAGNOSIS — M5186 Other intervertebral disc disorders, lumbar region: Secondary | ICD-10-CM | POA: Insufficient documentation

## 2015-01-21 DIAGNOSIS — M545 Low back pain, unspecified: Secondary | ICD-10-CM

## 2015-01-21 DIAGNOSIS — M79604 Pain in right leg: Secondary | ICD-10-CM

## 2015-02-14 DIAGNOSIS — E118 Type 2 diabetes mellitus with unspecified complications: Secondary | ICD-10-CM | POA: Insufficient documentation

## 2015-02-14 DIAGNOSIS — E1165 Type 2 diabetes mellitus with hyperglycemia: Secondary | ICD-10-CM | POA: Insufficient documentation

## 2015-02-14 DIAGNOSIS — E119 Type 2 diabetes mellitus without complications: Secondary | ICD-10-CM | POA: Insufficient documentation

## 2015-05-10 DIAGNOSIS — M19019 Primary osteoarthritis, unspecified shoulder: Secondary | ICD-10-CM | POA: Insufficient documentation

## 2015-07-21 ENCOUNTER — Ambulatory Visit (INDEPENDENT_AMBULATORY_CARE_PROVIDER_SITE_OTHER): Payer: Managed Care, Other (non HMO) | Admitting: Nurse Practitioner

## 2015-07-21 ENCOUNTER — Encounter: Payer: Self-pay | Admitting: Nurse Practitioner

## 2015-07-21 VITALS — BP 122/78 | HR 80 | Temp 98.2°F | Ht 59.5 in | Wt 178.5 lb

## 2015-07-21 DIAGNOSIS — J42 Unspecified chronic bronchitis: Secondary | ICD-10-CM

## 2015-07-21 DIAGNOSIS — I10 Essential (primary) hypertension: Secondary | ICD-10-CM | POA: Diagnosis not present

## 2015-07-21 DIAGNOSIS — J4520 Mild intermittent asthma, uncomplicated: Secondary | ICD-10-CM

## 2015-07-21 DIAGNOSIS — Z1239 Encounter for other screening for malignant neoplasm of breast: Secondary | ICD-10-CM | POA: Diagnosis not present

## 2015-07-21 DIAGNOSIS — Z720 Tobacco use: Secondary | ICD-10-CM

## 2015-07-21 DIAGNOSIS — G4733 Obstructive sleep apnea (adult) (pediatric): Secondary | ICD-10-CM

## 2015-07-21 DIAGNOSIS — E039 Hypothyroidism, unspecified: Secondary | ICD-10-CM

## 2015-07-21 DIAGNOSIS — Z1211 Encounter for screening for malignant neoplasm of colon: Secondary | ICD-10-CM | POA: Diagnosis not present

## 2015-07-21 DIAGNOSIS — K7581 Nonalcoholic steatohepatitis (NASH): Secondary | ICD-10-CM

## 2015-07-21 DIAGNOSIS — M19012 Primary osteoarthritis, left shoulder: Secondary | ICD-10-CM

## 2015-07-21 DIAGNOSIS — E782 Mixed hyperlipidemia: Secondary | ICD-10-CM

## 2015-07-21 DIAGNOSIS — E119 Type 2 diabetes mellitus without complications: Secondary | ICD-10-CM

## 2015-07-21 NOTE — Patient Instructions (Addendum)
Welcome to [facility]! Nice to meet you.   See you in 3 months.   [facility] at [facility]  Address: [address] Madelaine Bhat [place], [place] [address]  Phone: [phone] Hours:  Monday - Thursday: 8 a.m. - 5 p.m. Friday: 8 a.m. - 3 p.m.

## 2015-07-21 NOTE — Progress Notes (Signed)
Patient ID: Brenda Farmer, female    DOB: 10/27/1965  Age: 50 y.o. MRN: [id number]  CC: Establish Care   HPI Brenda Farmer presents for establishing care and CC of left shoulder complaints.   1) New Pt Info:   Immunizations- Pneumovax [employer] in 2016    Flu UTD   Mammogram- Needs scheduled  Pap- abnl 1997, 2014 Dr. Dear last one was normal  LMP- hysterectomy   Colonoscopy- Interested in Cologuard  Depression screen negative    Left shoulder x-ray  2) Chronic Problems-  DM Type II- 7.9% A1c in Dec., checks only when feels bad   Thyroid disease- 2003 with Dr. Carmela Rima hashimoto's   Back pain - Medication was not helpful, Chiropractor helped a lot, numbness right leg laterally to right below the knee, walking exacerbates   Obesity- No exercise, tried to eat healthy recently   Tobacco Use- Chantix twice- felt strange psychologically    Not tried anything else  Elevated ALT- [place] in 2016, Fatty Liver  HLD/HTN- On medications currently   Sleep Apnea- 2001, has CPAP, wears nightly    3) Acute problems-  Left shoulder- concerned about the report. Does not bother her too much right now. Wants to wait on MRI, X-ray report in chart, diclofenac helpful, still paying on MRI of lumbar spine from last year she reports.   History Brenda Farmer has a past medical history of Diabetes mellitus without complication ([place]); Thyroid disease; Chicken pox; Sleep apnea; and Arthritis.   She has past surgical history that includes Appendectomy; Cesarean section; and Abdominal hysterectomy (2012).   Her family history includes Alcohol abuse in her father; Arthritis in her maternal grandfather and mother; Asthma in her paternal grandmother; Diabetes in her maternal grandmother; Heart disease in her father and paternal grandfather; Hyperlipidemia in her father, mother, and paternal grandfather; Hypertension in her father and paternal grandfather; Lung cancer in her paternal grandfather; Prostate cancer in  her paternal grandfather; Stroke in her maternal grandmother.She reports that she has been smoking Cigarettes.  She has been smoking about 0.50 packs per day. She has never used smokeless tobacco. She reports that she drinks alcohol. She reports that she does not use illicit drugs.  Outpatient Prescriptions Prior to Visit  Medication Sig Dispense Refill  . Blood Glucose Monitoring Suppl (ONE TOUCH ULTRA SYSTEM KIT) W/DEVICE KIT USE AS DIRECTED 1 each 1  . glucose blood (ONE TOUCH ULTRA TEST) test strip Use as instructed 100 each 11  . ketorolac (TORADOL) 10 MG tablet Take 1 tablet (10 mg total) by mouth every 6 (six) hours as needed. 20 tablet 0  . oxyCODONE-acetaminophen (ROXICET) 5-325 MG per tablet Take 1 tablet by mouth every 6 (six) hours as needed. 9 tablet 0  . predniSONE (STERAPRED UNI-PAK 21 TAB) 10 MG (21) TBPK tablet Take 6 tablets on day 1 Take 5 tablets on day 2 Take 4 tablets on day 3 Take 3 tablets on day 4 Take 2 tablets on day 5 Take 1 tablet on day 6 21 tablet 0   No facility-administered medications prior to visit.    ROS Review of Systems  Constitutional: Negative for fever, chills, diaphoresis and fatigue.  Respiratory: Negative for chest tightness, shortness of breath and wheezing.   Cardiovascular: Negative for chest pain, palpitations and leg swelling.  Gastrointestinal: Negative for nausea, vomiting and diarrhea.  Musculoskeletal: Positive for myalgias and arthralgias.       Left shoulder  Skin: Negative for rash.  Neurological: Negative for dizziness,  weakness, numbness and headaches.  Psychiatric/Behavioral: The patient is not nervous/anxious.     Objective:  BP 122/78 mmHg  Pulse 80  Temp(Src) 98.2 F (36.8 C) (Oral)  Ht 4' 11.5" (1.511 m)  Wt 178 lb 8 oz (80.967 kg)  BMI 35.46 kg/m2  SpO2 96%  Physical Exam  Constitutional: She is oriented to person, place, and time. She appears well-developed and well-nourished. No distress.  HENT:  Head:  Normocephalic and atraumatic.  Right Ear: External ear normal.  Left Ear: External ear normal.  Cardiovascular: Normal rate, regular rhythm and normal heart sounds.  Exam reveals no gallop and no friction rub.   No murmur heard. Pulmonary/Chest: Effort normal and breath sounds normal. No respiratory distress. She has no wheezes. She has no rales. She exhibits no tenderness.  Musculoskeletal: Normal range of motion. She exhibits tenderness. She exhibits no edema.  Tender left shoulder to palpation (generalized joints and muscles)  Neurological: She is alert and oriented to person, place, and time. No cranial nerve deficit. She exhibits normal muscle tone. Coordination normal.  Skin: Skin is warm and dry. No rash noted. She is not diaphoretic.  Psychiatric: She has a normal mood and affect. Her behavior is normal. Judgment and thought content normal.   Assessment & Plan:   Brenda Farmer was seen today for establish care.  Diagnoses and all orders for this visit:  Primary osteoarthritis of left shoulder  Screening for breast cancer -     MM Digital Screening; Future  Screen for colon cancer -     Cologuard  Benign essential HTN  Airway hyperreactivity, mild intermittent, uncomplicated  Chronic bronchitis, unspecified chronic bronchitis type (HCC)  NASH (nonalcoholic steatohepatitis)  Controlled type 2 diabetes mellitus without complication, without long-term current use of insulin (HCC)  Hypothyroidism, unspecified hypothyroidism type  Combined fat and carbohydrate induced hyperlipemia  Current tobacco use  OSA (obstructive sleep apnea)   I have discontinued Brenda Farmer's predniSONE, ketorolac, and oxyCODONE-acetaminophen. I am also having her maintain her [place] KIT, glucose blood, albuterol, fenofibrate, glimepiride, levothyroxine, lisinopril, metFORMIN, Insulin Pen Needle, diclofenac, and Liraglutide.  Meds ordered this encounter  Medications  . albuterol  (PROAIR HFA) 108 (90 Base) MCG/ACT inhaler    Sig: Inhale into the lungs.  . fenofibrate 54 MG tablet    Sig: Take by mouth.  Marland Kitchen glimepiride (AMARYL) 4 MG tablet    Sig: Take by mouth.  . levothyroxine (SYNTHROID, LEVOTHROID) 125 MCG tablet    Sig: Take by mouth.  Marland Kitchen lisinopril (PRINIVIL,ZESTRIL) 20 MG tablet    Sig: Take by mouth.  . metFORMIN (GLUCOPHAGE-XR) 500 MG 24 hr tablet    Sig: Take by mouth.  . Insulin Pen Needle (BD PEN NEEDLE NANO U/F) 32G X 4 MM MISC    Sig:   . diclofenac (VOLTAREN) 75 MG EC tablet    Sig: Take 75 mg by mouth 2 (two) times daily.  . Liraglutide (VICTOZA) 18 MG/3ML SOPN    Sig: Inject 0.6 mg into the skin daily. Inject 1.8 mg subcutaneously once daily.     Follow-up: Return in about 3 months (around 10/21/2015) for Follow up.

## 2015-07-25 ENCOUNTER — Encounter: Payer: Self-pay | Admitting: Nurse Practitioner

## 2015-07-25 DIAGNOSIS — J45909 Unspecified asthma, uncomplicated: Secondary | ICD-10-CM | POA: Insufficient documentation

## 2015-07-25 DIAGNOSIS — I10 Essential (primary) hypertension: Secondary | ICD-10-CM | POA: Insufficient documentation

## 2015-07-25 DIAGNOSIS — Z720 Tobacco use: Secondary | ICD-10-CM | POA: Insufficient documentation

## 2015-07-25 DIAGNOSIS — E039 Hypothyroidism, unspecified: Secondary | ICD-10-CM | POA: Insufficient documentation

## 2015-07-25 NOTE — Assessment & Plan Note (Signed)
Advised quitting- pt wishes to defer a longer discussion to the next visit

## 2015-07-25 NOTE — Assessment & Plan Note (Signed)
Obtaining records Pt on metformin, amaryl, and victoza Will obtain last A1c

## 2015-07-25 NOTE — Assessment & Plan Note (Signed)
Wears CPAP consistently Stable  Will follow

## 2015-07-25 NOTE — Assessment & Plan Note (Signed)
Stable currently Will obtain last labs Thyroid medication stable at this time 125 mcg

## 2015-07-25 NOTE — Assessment & Plan Note (Signed)
Will obtain records Continue current regimen

## 2015-07-25 NOTE — Assessment & Plan Note (Signed)
Stable currently  Advised smoking cessation Pt would like to work on this Albuterol inhaler prn

## 2015-07-25 NOTE — Assessment & Plan Note (Signed)
BP Readings from Last 3 Encounters:  07/21/15 122/78  01/16/15 146/72  06/13/10 114/72   Stable. Continue current regimen

## 2015-07-25 NOTE — Assessment & Plan Note (Addendum)
Unsure of specific type or h/o diagnosis Mycoplasma probable findings on last CXR in 2011 Obtaining records

## 2015-07-25 NOTE — Assessment & Plan Note (Signed)
Stable currently. Pt is not working on diet or exercising currently

## 2015-07-25 NOTE — Assessment & Plan Note (Addendum)
Left shoulder currently problematic, but not so much that she wishes to pursue an MRI at this time. We do have x-ray report. Pt is wishing to continue voltaren. Will follow

## 2015-07-28 ENCOUNTER — Ambulatory Visit: Payer: Managed Care, Other (non HMO)

## 2015-07-30 LAB — COLOGUARD: COLOGUARD: POSITIVE

## 2015-08-03 ENCOUNTER — Encounter: Payer: Self-pay | Admitting: Nurse Practitioner

## 2015-08-05 ENCOUNTER — Ambulatory Visit
Admission: RE | Admit: 2015-08-05 | Discharge: 2015-08-05 | Disposition: A | Discharge status: Home / Self Care | Payer: Managed Care, Other (non HMO) | Source: Ambulatory Visit | Attending: Nurse Practitioner | Admitting: Nurse Practitioner

## 2015-08-05 DIAGNOSIS — Z1231 Encounter for screening mammogram for malignant neoplasm of breast: Secondary | ICD-10-CM | POA: Insufficient documentation

## 2015-08-05 DIAGNOSIS — Z1239 Encounter for other screening for malignant neoplasm of breast: Secondary | ICD-10-CM

## 2015-08-11 ENCOUNTER — Encounter: Payer: Self-pay | Admitting: Nurse Practitioner

## 2015-08-15 ENCOUNTER — Other Ambulatory Visit: Payer: Self-pay | Admitting: Nurse Practitioner

## 2015-08-15 DIAGNOSIS — Z9189 Other specified personal risk factors, not elsewhere classified: Secondary | ICD-10-CM

## 2015-08-16 ENCOUNTER — Telehealth: Payer: Self-pay

## 2015-08-16 NOTE — Telephone Encounter (Signed)
Notified pt of test results, pt verbalized understanding and appreciation. Pt  States that she also spoke with team lead about positive test results.

## 2015-08-16 NOTE — Telephone Encounter (Signed)
Pt states that she was contacted by GI to setup an appointment but pt claims she never received her test result from the colorguard. Pt is upset and worried about the test results. Please advise, thanks

## 2015-08-16 NOTE — Telephone Encounter (Signed)
She had a positive- I sent the note to Lavella Lemons to call pt (didn't think GI would call so soon- just placed the order last night)- please let her know that since the Cologuard was positive that this requires a gastro consult just to be on the safe side.

## 2015-08-31 ENCOUNTER — Other Ambulatory Visit: Payer: Self-pay

## 2015-08-31 MED ORDER — ALBUTEROL SULFATE HFA 108 (90 BASE) MCG/ACT IN AERS: 1.0000 | INHALATION_SPRAY | RESPIRATORY_TRACT | Status: DC | PRN

## 2015-08-31 MED ORDER — HYDROCHLOROTHIAZIDE 25 MG PO TABS: 25.0000 mg | ORAL_TABLET | Freq: Every day | ORAL | Status: DC

## 2015-08-31 NOTE — Telephone Encounter (Signed)
Please advise refills, these were under a different provider prior. thank

## 2015-10-21 ENCOUNTER — Ambulatory Visit: Payer: Managed Care, Other (non HMO) | Admitting: Nurse Practitioner

## 2015-10-21 ENCOUNTER — Encounter: Payer: Self-pay | Admitting: Family Medicine

## 2015-10-21 ENCOUNTER — Ambulatory Visit (INDEPENDENT_AMBULATORY_CARE_PROVIDER_SITE_OTHER): Payer: Managed Care, Other (non HMO) | Admitting: Family Medicine

## 2015-10-21 VITALS — BP 106/66 | HR 94 | Temp 98.7°F | Ht 59.5 in | Wt 176.4 lb

## 2015-10-21 DIAGNOSIS — E119 Type 2 diabetes mellitus without complications: Secondary | ICD-10-CM

## 2015-10-21 DIAGNOSIS — E039 Hypothyroidism, unspecified: Secondary | ICD-10-CM

## 2015-10-21 DIAGNOSIS — I10 Essential (primary) hypertension: Secondary | ICD-10-CM | POA: Diagnosis not present

## 2015-10-21 DIAGNOSIS — M25512 Pain in left shoulder: Secondary | ICD-10-CM

## 2015-10-21 DIAGNOSIS — E785 Hyperlipidemia, unspecified: Secondary | ICD-10-CM | POA: Insufficient documentation

## 2015-10-21 DIAGNOSIS — M67912 Unspecified disorder of synovium and tendon, left shoulder: Secondary | ICD-10-CM | POA: Insufficient documentation

## 2015-10-21 MED ORDER — METFORMIN HCL ER 500 MG PO TB24: 1000.0000 mg | ORAL_TABLET | Freq: Every day | ORAL | Status: DC

## 2015-10-21 MED ORDER — LEVOTHYROXINE SODIUM 125 MCG PO TABS: 125.0000 ug | ORAL_TABLET | Freq: Every day | ORAL | Status: DC

## 2015-10-21 MED ORDER — GLUCOSE BLOOD VI STRP: ORAL_STRIP | Status: DC

## 2015-10-21 MED ORDER — LIRAGLUTIDE 18 MG/3ML ~~LOC~~ SOPN: 1.8000 mg | PEN_INJECTOR | Freq: Every day | SUBCUTANEOUS | Status: DC

## 2015-10-21 NOTE — Assessment & Plan Note (Signed)
At goal. Continue current medications. 

## 2015-10-21 NOTE — Assessment & Plan Note (Signed)
Chronic left shoulder pain. X-ray read reviewed from 2016. Recommended MRI left shoulder. This has been ordered.

## 2015-10-21 NOTE — Assessment & Plan Note (Signed)
Patient will return for fasting lipid panel. 

## 2015-10-21 NOTE — Assessment & Plan Note (Signed)
Fasting sugars somewhat uncontrolled. We'll plan to check a A1c with her fasting lab work. Continue current medications.

## 2015-10-21 NOTE — Progress Notes (Signed)
Patient ID: Brenda Farmer, female   DOB: 08-26-1965, 50 y.o.   MRN: [id number]  Tommi Rumps, MD Phone: [phone]  Brenda Farmer is a 50 y.o. female who presents today for f/u.  DIABETES Disease Monitoring: Blood Sugar ranges-120-200 Polyuria/phagia/dipsia- no      ophthalmology- has not seen in greater than a year Medications: Compliance- taking Victoza 1.8 mg, metformin 1000 mg daily, and Amaryl Hypoglycemic symptoms-rare  HYPERTENSION Disease Monitoring Home BP Monitoring not checking Chest pain- no    Dyspnea- no Medications Compliance-  taking lisinopril, HCTZ.   Edema- no  Left shoulder pain: Patient notes for years she has had constant throbbing left shoulder pain. Not positional. She had x-rays of her shoulders that revealed possible tendinitis or rotator cuff and possible bony lesion in the left shoulder for which they recommended MRI though she held off on this at that time. Pain is gotten worse and she would like to proceed with MRI.  HYPOTHYROIDISM Disease Monitoring Weight changes: No  Skin Changes: No Palpitations: No Heat/Cold intolerance: No  Medication Monitoring Compliance:  Taking Synthroid 125 g daily    HYPERLIPIDEMIA Disease Monitoring: See symptoms for Hypertension Medications: Compliance- taking fenofibrate  PMH: Smoker   ROS see history of present illness  Objective  Physical Exam Filed Vitals:   10/21/15 1559  BP: 106/66  Pulse: 94  Temp: 98.7 F (37.1 C)    BP Readings from Last 3 Encounters:  10/21/15 106/66  07/21/15 122/78  01/16/15 146/72   Wt Readings from Last 3 Encounters:  10/21/15 176 lb 6.4 oz (80.015 kg)  07/21/15 178 lb 8 oz (80.967 kg)  01/16/15 172 lb (78.019 kg)    Physical Exam  Constitutional: She is well-developed, well-nourished, and in no distress.  HENT:  Head: Normocephalic and atraumatic.  Right Ear: External ear normal.  Left Ear: External ear normal.  Cardiovascular: Normal rate,  regular rhythm and normal heart sounds.   Pulmonary/Chest: Effort normal and breath sounds normal.  Musculoskeletal:  Left shoulder with minimal tenderness over the midportion of the clavicle and bicipital groove, no bony defects noted in the shoulder or clavicle, discomfort on internal rotation, no discomfort on external rotation, full range of motion, negative empty can and speeds Right shoulder with no tenderness, discomfort on rotation, full range of motion, negative empty can and speeds  Neurological: She is alert.  5 out of 5 strength bilateral biceps, triceps, and grip, sensation light touch intact bilateral upper extremities  Skin: Skin is warm and dry. She is not diaphoretic.     Assessment/Plan: Please see individual problem list.  Benign essential HTN At goal. Continue current medications.  Hypothyroidism Asymptomatic. Check TSH with fasting labs.  Controlled type 2 diabetes mellitus without complication (HCC) Fasting sugars somewhat uncontrolled. We'll plan to check a A1c with her fasting lab work. Continue current medications.  Left shoulder pain Chronic left shoulder pain. X-ray read reviewed from 2016. Recommended MRI left shoulder. This has been ordered.  Hyperlipidemia Patient will return for fasting lipid panel.    Orders Placed This Encounter  Procedures  . MR Shoulder Left Wo Contrast    Standing Status: Future     Number of Occurrences:      Standing Expiration Date: 12/20/2016    Order Specific Question:  Reason for Exam (SYMPTOM  OR DIAGNOSIS REQUIRED)    Answer:  Chronic left shoulder pain, prior shoulder x-ray with soft tissue bony density adjacent to the bony cortex and the shoulder  Order Specific Question:  Preferred imaging location?    Answer:  [facility] (table limit-300lbs)    Order Specific Question:  What is the patient's sedation requirement?    Answer:  No Sedation    Order Specific Question:  Does the patient have a pacemaker or  implanted devices?    Answer:  No  . HgB A1c    Standing Status: Future     Number of Occurrences:      Standing Expiration Date: 10/20/2016  . TSH    Standing Status: Future     Number of Occurrences:      Standing Expiration Date: 10/20/2016  . Comp Met (CMET)    Standing Status: Future     Number of Occurrences:      Standing Expiration Date: 10/20/2016  . Lipid Profile    Standing Status: Future     Number of Occurrences:      Standing Expiration Date: 10/20/2016    Meds ordered this encounter  Medications  . metFORMIN (GLUCOPHAGE-XR) 500 MG 24 hr tablet    Sig: Take 2 tablets (1,000 mg total) by mouth daily with breakfast.    Dispense:  180 tablet    Refill:  3  . Liraglutide (VICTOZA) 18 MG/3ML SOPN    Sig: Inject 0.3 mLs (1.8 mg total) into the skin daily.    Dispense:  6 mL    Refill:  3  . levothyroxine (SYNTHROID, LEVOTHROID) 125 MCG tablet    Sig: Take 1 tablet (125 mcg total) by mouth daily before breakfast.    Dispense:  90 tablet    Refill:  3  . glucose blood (ONE TOUCH ULTRA TEST) test strip    Sig: Use as instructed    Dispense:  100 each    Refill:  11    Check blood sugar 3 times daily as directed    Tommi Rumps, MD [place]

## 2015-10-21 NOTE — Assessment & Plan Note (Signed)
Asymptomatic. Check TSH with fasting labs.

## 2015-10-21 NOTE — Progress Notes (Signed)
Pre visit review using our clinic review tool, if applicable. No additional management support is needed unless otherwise documented below in the visit note. 

## 2015-10-21 NOTE — Patient Instructions (Signed)
Nice to meet you. I have refilled her medications. We will obtain an MRI of your left shoulder.

## 2015-11-01 ENCOUNTER — Other Ambulatory Visit (INDEPENDENT_AMBULATORY_CARE_PROVIDER_SITE_OTHER): Payer: Managed Care, Other (non HMO)

## 2015-11-01 DIAGNOSIS — E039 Hypothyroidism, unspecified: Secondary | ICD-10-CM

## 2015-11-01 DIAGNOSIS — E119 Type 2 diabetes mellitus without complications: Secondary | ICD-10-CM

## 2015-11-01 DIAGNOSIS — E785 Hyperlipidemia, unspecified: Secondary | ICD-10-CM

## 2015-11-01 LAB — COMPREHENSIVE METABOLIC PANEL
ALBUMIN: 4.4 g/dL (ref 3.5–5.2)
ALK PHOS: 75 U/L (ref 39–117)
ALT: 119 U/L — AB (ref 0–35)
AST: 46 U/L — ABNORMAL HIGH (ref 0–37)
BILIRUBIN TOTAL: 0.4 mg/dL (ref 0.2–1.2)
BUN: 17 mg/dL (ref 6–23)
CO2: 26 mEq/L (ref 19–32)
CREATININE: 0.75 mg/dL (ref 0.40–1.20)
Calcium: 9.7 mg/dL (ref 8.4–10.5)
Chloride: 100 mEq/L (ref 96–112)
GFR: 86.63 mL/min (ref >60.00)
GLUCOSE: 193 mg/dL — AB (ref 70–99)
POTASSIUM: 4.6 meq/L (ref 3.5–5.1)
SODIUM: 135 meq/L (ref 135–145)
TOTAL PROTEIN: 6.8 g/dL (ref 6.0–8.3)

## 2015-11-01 LAB — LIPID PANEL
Cholesterol: 265 mg/dL — ABNORMAL HIGH (ref 0–200)
HDL: 37.3 mg/dL — ABNORMAL LOW (ref >39.00)
NonHDL: 227.29
Total CHOL/HDL Ratio: 7
Triglycerides: 266 mg/dL — ABNORMAL HIGH (ref 0.0–149.0)
VLDL: 53.2 mg/dL — ABNORMAL HIGH (ref 0.0–40.0)

## 2015-11-01 LAB — HEMOGLOBIN A1C: Hgb A1c MFr Bld: 8 % — ABNORMAL HIGH (ref 4.6–6.5)

## 2015-11-01 LAB — TSH: TSH: 3.27 u[IU]/mL (ref 0.35–4.50)

## 2015-11-01 LAB — LDL CHOLESTEROL, DIRECT: Direct LDL: 191 mg/dL

## 2015-11-02 ENCOUNTER — Other Ambulatory Visit: Payer: Self-pay | Admitting: Family Medicine

## 2015-11-02 ENCOUNTER — Telehealth: Payer: Self-pay | Admitting: Family Medicine

## 2015-11-02 MED ORDER — METFORMIN HCL ER 500 MG PO TB24: ORAL_TABLET | ORAL | Status: DC

## 2015-11-02 NOTE — Telephone Encounter (Signed)
Notified patient of lab results 

## 2015-11-02 NOTE — Telephone Encounter (Signed)
Pt called returning your call regarding lab results. Thank you!

## 2015-11-04 ENCOUNTER — Encounter: Payer: Self-pay | Admitting: *Deleted

## 2015-11-04 ENCOUNTER — Other Ambulatory Visit: Payer: Self-pay | Admitting: Family Medicine

## 2015-11-04 DIAGNOSIS — R945 Abnormal results of liver function studies: Principal | ICD-10-CM

## 2015-11-04 DIAGNOSIS — R7989 Other specified abnormal findings of blood chemistry: Secondary | ICD-10-CM

## 2015-11-05 ENCOUNTER — Encounter
Admission: RE | Disposition: A | Discharge status: Home / Self Care | Payer: Self-pay | Source: Ambulatory Visit | Attending: Gastroenterology

## 2015-11-05 ENCOUNTER — Ambulatory Visit
Admission: RE | Admit: 2015-11-05 | Discharge: 2015-11-05 | Disposition: A | Discharge status: Home / Self Care | Payer: Managed Care, Other (non HMO) | Source: Ambulatory Visit | Attending: Gastroenterology | Admitting: Gastroenterology

## 2015-11-05 ENCOUNTER — Ambulatory Visit: Payer: Managed Care, Other (non HMO) | Admitting: *Deleted

## 2015-11-05 ENCOUNTER — Encounter: Payer: Self-pay | Admitting: *Deleted

## 2015-11-05 DIAGNOSIS — E039 Hypothyroidism, unspecified: Secondary | ICD-10-CM | POA: Diagnosis not present

## 2015-11-05 DIAGNOSIS — M1990 Unspecified osteoarthritis, unspecified site: Secondary | ICD-10-CM | POA: Diagnosis not present

## 2015-11-05 DIAGNOSIS — D125 Benign neoplasm of sigmoid colon: Secondary | ICD-10-CM | POA: Insufficient documentation

## 2015-11-05 DIAGNOSIS — K648 Other hemorrhoids: Secondary | ICD-10-CM | POA: Diagnosis not present

## 2015-11-05 DIAGNOSIS — J45909 Unspecified asthma, uncomplicated: Secondary | ICD-10-CM | POA: Diagnosis not present

## 2015-11-05 DIAGNOSIS — F17210 Nicotine dependence, cigarettes, uncomplicated: Secondary | ICD-10-CM | POA: Insufficient documentation

## 2015-11-05 DIAGNOSIS — Z885 Allergy status to narcotic agent status: Secondary | ICD-10-CM | POA: Diagnosis not present

## 2015-11-05 DIAGNOSIS — I10 Essential (primary) hypertension: Secondary | ICD-10-CM | POA: Diagnosis not present

## 2015-11-05 DIAGNOSIS — K5730 Diverticulosis of large intestine without perforation or abscess without bleeding: Secondary | ICD-10-CM | POA: Diagnosis present

## 2015-11-05 DIAGNOSIS — Z79899 Other long term (current) drug therapy: Secondary | ICD-10-CM | POA: Insufficient documentation

## 2015-11-05 DIAGNOSIS — K621 Rectal polyp: Secondary | ICD-10-CM | POA: Insufficient documentation

## 2015-11-05 DIAGNOSIS — E119 Type 2 diabetes mellitus without complications: Secondary | ICD-10-CM | POA: Diagnosis not present

## 2015-11-05 DIAGNOSIS — D127 Benign neoplasm of rectosigmoid junction: Secondary | ICD-10-CM | POA: Diagnosis not present

## 2015-11-05 DIAGNOSIS — Z8619 Personal history of other infectious and parasitic diseases: Secondary | ICD-10-CM | POA: Diagnosis not present

## 2015-11-05 DIAGNOSIS — J449 Chronic obstructive pulmonary disease, unspecified: Secondary | ICD-10-CM | POA: Insufficient documentation

## 2015-11-05 HISTORY — PX: COLONOSCOPY WITH PROPOFOL: SHX5780

## 2015-11-05 LAB — HM COLONOSCOPY

## 2015-11-05 LAB — GLUCOSE, CAPILLARY: GLUCOSE-CAPILLARY: 193 mg/dL — AB (ref 65–99)

## 2015-11-05 SURGERY — COLONOSCOPY WITH PROPOFOL
Anesthesia: General

## 2015-11-05 MED ORDER — PHENYLEPHRINE HCL 10 MG/ML IJ SOLN
INTRAMUSCULAR | Status: DC | PRN
  Administered 2015-11-05 (×2): 100 ug via INTRAVENOUS

## 2015-11-05 MED ORDER — ONDANSETRON HCL 4 MG/2ML IJ SOLN: 4.0000 mg | Freq: Once | INTRAMUSCULAR | Status: DC | PRN

## 2015-11-05 MED ORDER — PROPOFOL 500 MG/50ML IV EMUL
INTRAVENOUS | Status: DC | PRN
  Administered 2015-11-05: 100 ug/kg/min via INTRAVENOUS

## 2015-11-05 MED ORDER — FENTANYL CITRATE (PF) 100 MCG/2ML IJ SOLN: 25.0000 ug | INTRAMUSCULAR | Status: DC | PRN

## 2015-11-05 MED ORDER — SODIUM CHLORIDE 0.9 % IV SOLN: INTRAVENOUS | Status: DC

## 2015-11-05 MED ORDER — SODIUM CHLORIDE 0.9 % IV SOLN
INTRAVENOUS | Status: DC
  Administered 2015-11-05 (×2): via INTRAVENOUS

## 2015-11-05 NOTE — Transfer of Care (Signed)
Immediate Anesthesia Transfer of Care Note  Patient: Brenda Farmer  Procedure(s) Performed: Procedure(s): COLONOSCOPY WITH PROPOFOL (N/A)  Patient Location: PACU  Anesthesia Type:General  Level of Consciousness: awake, alert  and oriented  Airway & Oxygen Therapy: Patient Spontanous Breathing  Post-op Assessment: Report given to RN and Post -op Vital signs reviewed and stable  Post vital signs: Reviewed and stable  Last Vitals:  Filed Vitals:   11/05/15 0713  BP: 123/60  Pulse: 81  Temp: 36.2 C  Resp: 20    Last Pain: There were no vitals filed for this visit.       Complications: No apparent anesthesia complications

## 2015-11-05 NOTE — H&P (Signed)
Outpatient short stay form Pre-procedure 11/05/2015 7:45 AM Martin U Skulskie MD  Primary Physician: Carrie Doss NP  Reason for visit:  colonoscopy  History of present illness:  Patient is a 50-year-old female pre test at her primary physician which was There is no family history of colon polyps or colon cancer.she denies use of any NSAIDs or blood thinning agents. She tolerated her prep well.    Current facility-administered medications:  .  0.9 %  sodium chloride infusion, , Intravenous, Continuous, Martin U Skulskie, MD, Last Rate: 20 mL/hr at 11/05/15 0733 .  0.9 %  sodium chloride infusion, , Intravenous, Continuous, Martin U Skulskie, MD  Prescriptions prior to admission  Medication Sig Dispense Refill Last Dose  . albuterol (PROAIR HFA) 108 (90 Base) MCG/ACT inhaler Inhale 1 puff into the lungs every 4 (four) hours as needed for wheezing or shortness of breath. 18 g 2 11/04/2015 at Unknown time  . diclofenac (VOLTAREN) 75 MG EC tablet Take 75 mg by mouth 2 (two) times daily.   11/04/2015 at Unknown time  . fenofibrate 54 MG tablet Take by mouth.   Past Week at Unknown time  . glimepiride (AMARYL) 4 MG tablet Take by mouth.   Past Week at Unknown time  . hydrochlorothiazide (HYDRODIURIL) 25 MG tablet Take 1 tablet (25 mg total) by mouth daily. 90 tablet 3 11/05/2015 at 0330  . Insulin Pen Needle (BD PEN NEEDLE NANO U/F) 32G X 4 MM MISC    Past Week at Unknown time  . levothyroxine (SYNTHROID, LEVOTHROID) 125 MCG tablet Take 1 tablet (125 mcg total) by mouth daily before breakfast. 90 tablet 3 Past Week at Unknown time  . Liraglutide (VICTOZA) 18 MG/3ML SOPN Inject 0.3 mLs (1.8 mg total) into the skin daily. 6 mL 3 Past Week at Unknown time  . lisinopril (PRINIVIL,ZESTRIL) 20 MG tablet Take by mouth.   Past Week at Unknown time  . metFORMIN (GLUCOPHAGE-XR) 500 MG 24 hr tablet Please take 1500 mg (3 tablets) by mouth daily for 7 days, then increase to 2000 mg (4 tablets) by mouth daily 360  tablet 3 Past Week at Unknown time  . Blood Glucose Monitoring Suppl (ONE TOUCH ULTRA SYSTEM KIT) W/DEVICE KIT USE AS DIRECTED 1 each 1 Taking  . glucose blood (ONE TOUCH ULTRA TEST) test strip Use as instructed 100 each 11      Allergies  Allergen Reactions  . Codeine Itching     Past Medical History  Diagnosis Date  . Diabetes mellitus without complication (HCC)   . Thyroid disease   . Chicken pox   . Sleep apnea   . Arthritis     Review of systems:      Physical Exam    Heart and lungs: regular rate and rhythm without rub    HEENT: Norm cephalic atraumatic eyes are anicteric    Other:    Pertinant exam for procedure: soft nontender nondistended bowel sou    Planned proceedures:  I have discussed the risks benefits and complications of procedures to include not limited to bleeding, infection, perforation and the risk of sedation and the patient wishes to proceed.colonoscopy and indicated procedures.    Martin U Skulskie, MD Gastroenterology 11/05/2015  7:45 AM      

## 2015-11-05 NOTE — Op Note (Signed)
[facility]astroenterology Patient Name: Brenda Farmer Procedure Date: 11/05/2015 7:44 AM MRN: [id number] Account #: [id number] Date of Birth: 1965/12/07 Admit Type: Outpatient Age: 50 Room: [facility] ENDO ROOM 3 Gender: Female Note Status: Finalized Procedure:            Colonoscopy Indications:          Positive Cologuard test Providers:            Lollie Sails, MD Referring MD:         Randall Hiss g. Caryl Bis (Referring MD) Medicines:            Monitored Anesthesia Care Complications:        No immediate complications. Procedure:            Pre-Anesthesia Assessment:                       - ASA Grade Assessment: III - A patient with severe                        systemic disease.                       After obtaining informed consent, the colonoscope was                        passed under direct vision. Throughout the procedure,                        the patient's blood pressure, pulse, and oxygen                        saturations were monitored continuously. The                        Colonoscope was introduced through the anus and                        advanced to the the cecum, identified by appendiceal                        orifice and ileocecal valve. The colonoscopy was                        performed with moderate difficulty. Successful                        completion of the procedure was aided by changing the                        patient to a supine position and using manual pressure.                        The quality of the bowel preparation was fair. Findings:      Multiple small-mouthed diverticula were found in the sigmoid colon,       descending colon, transverse colon, hepatic flexure and ascending colon.      Two sessile polyps were found in the sigmoid colon. The polyps were 3 to       4 mm in size. These polyps were removed with a cold snare. Resection and       retrieval were  complete.      Eight sessile polyps were found in  the recto-sigmoid colon. The polyps       were 1 to 2 mm in size. These polyps were removed with a cold biopsy       forceps. Resection and retrieval were complete.      A 5 mm polyp was found in the rectum. The polyp was sessile. The polyp       was removed with a cold snare. Resection and retrieval were complete.      A 7 mm polyp was found in the rectum, just inside the anal verge. The       polyp was sessile. The polyp was removed with a cold biopsy forceps.       Resection and retrieval were complete.      Two sessile polyps were found in the distal rectum. The polyps were 1 to       3 mm in size. These polyps were removed with a cold snare. Resection and       retrieval were complete.      No additional abnormalities were found on retroflexion.      Non-bleeding internal hemorrhoids were found during retroflexion and       during anoscopy. The hemorrhoids were medium-sized and Grade I (internal       hemorrhoids that do not prolapse). Impression:           - Preparation of the colon was fair.                       - Diverticulosis in the sigmoid colon, in the                        descending colon, in the transverse colon, at the                        hepatic flexure and in the ascending colon.                       - Two 3 to 4 mm polyps in the sigmoid colon, removed                        with a cold snare. Resected and retrieved.                       - Eight 1 to 2 mm polyps at the recto-sigmoid colon,                        removed with a cold biopsy forceps. Resected and                        retrieved.                       - One 5 mm polyp in the rectum, removed with a cold                        snare. Resected and retrieved.                       - One 7 mm polyp in the rectum, removed with a cold  biopsy forceps. Resected and retrieved.                       - Two 1 to 3 mm polyps in the rectum, removed with a                        cold snare.  Resected and retrieved.                       - Non-bleeding internal hemorrhoids. Recommendation:       - Discharge patient to home.                       - Await pathology results.                       - Telephone GI clinic for pathology results in 1 week. Procedure Code(s):    --- Professional ---                       [phone], Colonoscopy, flexible; with removal of tumor(s),                        polyp(s), or other lesion(s) by snare technique                       45380, 63, Colonoscopy, flexible; with biopsy, single                        or multiple Diagnosis Code(s):    --- Professional ---                       K64.0, First degree hemorrhoids                       D12.5, Benign neoplasm of sigmoid colon                       D12.7, Benign neoplasm of rectosigmoid junction                       K62.1, Rectal polyp                       R19.5, Other fecal abnormalities                       K57.30, Diverticulosis of large intestine without                        perforation or abscess without bleeding CPT copyright 2016 American Medical Association. All rights reserved. The codes documented in this report are preliminary and upon coder review may  be revised to meet current compliance requirements. Lollie Sails, MD 11/05/2015 9:02:56 AM This report has been signed electronically. Number of Addenda: 0 Note Initiated On: 11/05/2015 7:44 AM Scope Withdrawal Time: 0 hours 50 minutes 25 seconds  Total Procedure Duration: 0 hours 59 minutes 29 seconds       [facility]

## 2015-11-05 NOTE — Anesthesia Postprocedure Evaluation (Signed)
Anesthesia Post Note  Patient: Brenda Farmer  Procedure(s) Performed: Procedure(s) (LRB): COLONOSCOPY WITH PROPOFOL (N/A)  Patient location during evaluation: PACU Anesthesia Type: General Level of consciousness: awake and alert and oriented Pain management: pain level controlled Vital Signs Assessment: post-procedure vital signs reviewed and stable Respiratory status: spontaneous breathing Cardiovascular status: blood pressure returned to baseline Anesthetic complications: no    Last Vitals:  Filed Vitals:   11/05/15 0918 11/05/15 0928  BP: 104/65 139/69  Pulse: 77 76  Temp:    Resp: 15 23    Last Pain: There were no vitals filed for this visit.               Kahlee Metivier

## 2015-11-05 NOTE — Anesthesia Preprocedure Evaluation (Addendum)
Anesthesia Evaluation  Patient identified by MRN, date of birth, ID band Patient awake    Reviewed: Allergy & Precautions, NPO status , Patient's Chart, lab work & pertinent test results  Airway Mallampati: III  TM Distance: <3 FB     Dental  (+) Chipped   Pulmonary asthma , sleep apnea , COPD,  COPD inhaler, Current Smoker,    Pulmonary exam normal        Cardiovascular hypertension, Pt. on medications Normal cardiovascular exam     Neuro/Psych negative neurological ROS  negative psych ROS   GI/Hepatic (+) Hepatitis -, Unspecified  Endo/Other  diabetes, Well Controlled, Oral Hypoglycemic AgentsHypothyroidism   Renal/GU   negative genitourinary   Musculoskeletal  (+) Arthritis , Osteoarthritis,    Abdominal Normal abdominal exam  (+)   Peds negative pediatric ROS (+)  Hematology   Anesthesia Other Findings   Reproductive/Obstetrics                            Anesthesia Physical Anesthesia Plan  ASA: III  Anesthesia Plan: General   Post-op Pain Management:    Induction: Intravenous  Airway Management Planned: Nasal Cannula  Additional Equipment:   Intra-op Plan:   Post-operative Plan:   Informed Consent: I have reviewed the patients History and Physical, chart, labs and discussed the procedure including the risks, benefits and alternatives for the proposed anesthesia with the patient or authorized representative who has indicated his/her understanding and acceptance.   Dental advisory given  Plan Discussed with: CRNA and Surgeon  Anesthesia Plan Comments:         Anesthesia Quick Evaluation

## 2015-11-08 ENCOUNTER — Other Ambulatory Visit (INDEPENDENT_AMBULATORY_CARE_PROVIDER_SITE_OTHER): Payer: Managed Care, Other (non HMO)

## 2015-11-08 DIAGNOSIS — R7989 Other specified abnormal findings of blood chemistry: Secondary | ICD-10-CM | POA: Diagnosis not present

## 2015-11-08 DIAGNOSIS — R945 Abnormal results of liver function studies: Principal | ICD-10-CM

## 2015-11-08 LAB — HEPATIC FUNCTION PANEL
ALT: 93 U/L — ABNORMAL HIGH (ref 0–35)
AST: 41 U/L — ABNORMAL HIGH (ref 0–37)
Albumin: 4.2 g/dL (ref 3.5–5.2)
Alkaline Phosphatase: 71 U/L (ref 39–117)
BILIRUBIN DIRECT: 0.1 mg/dL (ref 0.0–0.3)
BILIRUBIN TOTAL: 0.4 mg/dL (ref 0.2–1.2)
Total Protein: 6.9 g/dL (ref 6.0–8.3)

## 2015-11-08 LAB — SURGICAL PATHOLOGY

## 2015-11-11 ENCOUNTER — Other Ambulatory Visit: Payer: Self-pay | Admitting: Family Medicine

## 2015-11-11 DIAGNOSIS — K760 Fatty (change of) liver, not elsewhere classified: Secondary | ICD-10-CM

## 2015-11-11 MED ORDER — LIRAGLUTIDE 18 MG/3ML ~~LOC~~ SOPN: 1.8000 mg | PEN_INJECTOR | Freq: Every day | SUBCUTANEOUS | Status: DC

## 2015-11-11 MED ORDER — ATORVASTATIN CALCIUM 10 MG PO TABS: 10.0000 mg | ORAL_TABLET | Freq: Every day | ORAL | Status: DC

## 2015-11-22 ENCOUNTER — Ambulatory Visit
Admission: RE | Admit: 2015-11-22 | Discharge: 2015-11-22 | Disposition: A | Discharge status: Home / Self Care | Payer: Managed Care, Other (non HMO) | Source: Ambulatory Visit | Attending: Family Medicine | Admitting: Family Medicine

## 2015-11-22 DIAGNOSIS — M7582 Other shoulder lesions, left shoulder: Secondary | ICD-10-CM | POA: Insufficient documentation

## 2015-11-22 DIAGNOSIS — M25512 Pain in left shoulder: Secondary | ICD-10-CM | POA: Diagnosis present

## 2015-11-24 ENCOUNTER — Other Ambulatory Visit: Payer: Managed Care, Other (non HMO)

## 2015-11-26 ENCOUNTER — Other Ambulatory Visit (INDEPENDENT_AMBULATORY_CARE_PROVIDER_SITE_OTHER): Payer: Managed Care, Other (non HMO)

## 2015-11-26 DIAGNOSIS — K760 Fatty (change of) liver, not elsewhere classified: Secondary | ICD-10-CM

## 2015-11-26 LAB — HEPATIC FUNCTION PANEL
ALBUMIN: 4.5 g/dL (ref 3.5–5.2)
ALT: 49 U/L — ABNORMAL HIGH (ref 0–35)
AST: 23 U/L (ref 0–37)
Alkaline Phosphatase: 71 U/L (ref 39–117)
Bilirubin, Direct: 0 mg/dL (ref 0.0–0.3)
TOTAL PROTEIN: 7.1 g/dL (ref 6.0–8.3)
Total Bilirubin: 0.4 mg/dL (ref 0.2–1.2)

## 2015-12-21 ENCOUNTER — Telehealth: Payer: Self-pay | Admitting: Family Medicine

## 2015-12-21 NOTE — Telephone Encounter (Signed)
The patient called feeling tired blood suger running in the 300's for 3 days per the patient she has not been feeling well. I spoke with Dr. Ellen Henri assistant she informed me that she needed to be seen today. The patient will go to [facility] in.

## 2015-12-21 NOTE — Telephone Encounter (Signed)
Noted, I will make sure PCP is aware, thanks

## 2015-12-21 NOTE — Telephone Encounter (Signed)
Noted. Agree patient should be evaluated at walk-in clinic.

## 2016-01-26 ENCOUNTER — Encounter: Payer: Self-pay | Admitting: Family Medicine

## 2016-01-26 ENCOUNTER — Ambulatory Visit (INDEPENDENT_AMBULATORY_CARE_PROVIDER_SITE_OTHER): Payer: Managed Care, Other (non HMO) | Admitting: Family Medicine

## 2016-01-26 VITALS — BP 110/66 | HR 85 | Temp 98.4°F | Wt 174.6 lb

## 2016-01-26 DIAGNOSIS — I10 Essential (primary) hypertension: Secondary | ICD-10-CM

## 2016-01-26 DIAGNOSIS — F17218 Nicotine dependence, cigarettes, with other nicotine-induced disorders: Secondary | ICD-10-CM | POA: Insufficient documentation

## 2016-01-26 DIAGNOSIS — R200 Anesthesia of skin: Secondary | ICD-10-CM | POA: Insufficient documentation

## 2016-01-26 DIAGNOSIS — Z720 Tobacco use: Secondary | ICD-10-CM

## 2016-01-26 DIAGNOSIS — R202 Paresthesia of skin: Secondary | ICD-10-CM | POA: Insufficient documentation

## 2016-01-26 DIAGNOSIS — R232 Flushing: Secondary | ICD-10-CM | POA: Insufficient documentation

## 2016-01-26 DIAGNOSIS — Z23 Encounter for immunization: Secondary | ICD-10-CM

## 2016-01-26 DIAGNOSIS — F17210 Nicotine dependence, cigarettes, uncomplicated: Secondary | ICD-10-CM | POA: Insufficient documentation

## 2016-01-26 DIAGNOSIS — E119 Type 2 diabetes mellitus without complications: Secondary | ICD-10-CM

## 2016-01-26 DIAGNOSIS — N951 Menopausal and female climacteric states: Secondary | ICD-10-CM | POA: Diagnosis not present

## 2016-01-26 DIAGNOSIS — E785 Hyperlipidemia, unspecified: Secondary | ICD-10-CM

## 2016-01-26 LAB — COMPREHENSIVE METABOLIC PANEL
ALBUMIN: 4.7 g/dL (ref 3.5–5.2)
ALK PHOS: 69 U/L (ref 39–117)
ALT: 75 U/L — ABNORMAL HIGH (ref 0–35)
AST: 34 U/L (ref 0–37)
BUN: 20 mg/dL (ref 6–23)
CALCIUM: 10 mg/dL (ref 8.4–10.5)
CO2: 28 mEq/L (ref 19–32)
CREATININE: 0.62 mg/dL (ref 0.40–1.20)
Chloride: 100 mEq/L (ref 96–112)
GFR: 107.81 mL/min (ref >60.00)
Glucose, Bld: 96 mg/dL (ref 70–99)
POTASSIUM: 4.2 meq/L (ref 3.5–5.1)
SODIUM: 134 meq/L — AB (ref 135–145)
TOTAL PROTEIN: 7.5 g/dL (ref 6.0–8.3)
Total Bilirubin: 0.4 mg/dL (ref 0.2–1.2)

## 2016-01-26 LAB — HEMOGLOBIN A1C: Hgb A1c MFr Bld: 7.8 % — ABNORMAL HIGH (ref 4.6–6.5)

## 2016-01-26 LAB — CBC
HEMATOCRIT: 41.8 % (ref 36.0–46.0)
Hemoglobin: 14.4 g/dL (ref 12.0–15.0)
MCHC: 34.5 g/dL (ref 30.0–36.0)
MCV: 87.3 fl (ref 78.0–100.0)
PLATELETS: 286 10*3/uL (ref 150.0–400.0)
RBC: 4.79 Mil/uL (ref 3.87–5.11)
RDW: 14.7 % (ref 11.5–15.5)
WBC: 9.2 10*3/uL (ref 4.0–10.5)

## 2016-01-26 LAB — SEDIMENTATION RATE: SED RATE: 17 mm/h (ref 0–30)

## 2016-01-26 MED ORDER — ROSUVASTATIN CALCIUM 20 MG PO TABS: 20.0000 mg | ORAL_TABLET | Freq: Every day | ORAL | 3 refills | Status: DC

## 2016-01-26 MED ORDER — BUPROPION HCL ER (SMOKING DET) 150 MG PO TB12: ORAL_TABLET | ORAL | 3 refills | Status: DC

## 2016-01-26 NOTE — Progress Notes (Signed)
Pre visit review using our clinic review tool, if applicable. No additional management support is needed unless otherwise documented below in the visit note. 

## 2016-01-26 NOTE — Progress Notes (Signed)
Tommi Rumps, MD Phone: [phone]  ALEESHA RINGSTAD is a 50 y.o. female who presents today for follow-up.  HYPERTENSION Disease Monitoring: Blood pressure range-not checking Chest pain- no      Dyspnea- no Medications: Compliance- taking lisinopril, hctz   Edema- no  DIABETES Disease Monitoring: Blood Sugar ranges-90's Polyuria/phagia/dipsia- no      Optho- has appointment next month Medications: Compliance- taking victoza, metformin, and amaryl Hypoglycemic symptoms- no  HYPERLIPIDEMIA Disease Monitoring: See symptoms for Hypertension Medications: Compliance- taking fenofibrate. Reports she discontinued Lipitor as she had some fatigue with it. Right upper quadrant pain- no  Muscle aches- no  Hot flashes: Patient notes for sometime she's had issues with hot flashes and night sweats. Notes they occur most nights. Also has sweats during the day. Is down about 5 or so pounds. No cough. Feels sluggish some days. No other symptoms with this. Does not have a uterus so is unsure if she has gone through menopause yet.  Tobacco abuse: Smokes about half a pack per day. Is interested in quitting. Would like to consider Wellbutrin. Has tried Chantix and did not respond well to it in the past.  Patient additionally notes chronic right lateral lower thigh numbness and tingling. Has this constantly. Has not changed recently. No numbness or tingling elsewhere. No weakness. No back pain.  PMH: Smoker   ROS see history of present illness  Objective  Physical Exam Vitals:   01/26/16 1416  BP: 110/66  Pulse: 85  Temp: 98.4 F (36.9 C)    BP Readings from Last 3 Encounters:  01/26/16 110/66  11/05/15 139/69  10/21/15 106/66   Wt Readings from Last 3 Encounters:  01/26/16 174 lb 9.6 oz (79.2 kg)  11/05/15 173 lb (78.5 kg)  10/21/15 176 lb 6.4 oz (80 kg)    Physical Exam  Constitutional: No distress.  Cardiovascular: Normal rate, regular rhythm and normal heart sounds.     Pulmonary/Chest: Effort normal and breath sounds normal.  Musculoskeletal: She exhibits no edema.  Neurological: She is alert.  5/5 strength in bilateral quads, hamstrings, plantar and dorsiflexion, mildly decreased sensation over right lateral lower thigh otherwise sensation to light touch intact in bilateral LE, normal gait, 2+ patellar reflexes  Skin: Skin is warm and dry. She is not diaphoretic.     Assessment/Plan: Please see individual problem list.  Benign essential HTN At goal. Continue current medications.  Controlled type 2 diabetes mellitus without complication (HCC) Glucoses at home would appear to be well controlled. We'll check an A1c today. Continue current medication.  Hyperlipidemia Did not tolerate Lipitor. Discussed starting on Crestor. She will discontinue the fenofibrate while trying the Crestor.  Hot flashes Patient's symptoms most consistent with hot flashes likely related to menopausal status. We will check lab work as outlined below to evaluate for this cause and other causes.  Tobacco abuse Patient is ready to quit. Will start on Wellbutrin.  Numbness and tingling of right distal lateral thigh Patient with chronic symptoms in this area. Does have some slight neurological deficit in this area. No other neurological deficits. I discussed neurology evaluation for nerve conduction studies though she declined at this time and wanted to continue to monitor. If worsens would encourage neurology evaluation.   Orders Placed This Encounter  Procedures  . Flu Vaccine QUAD 36+ mos IM  . [place]  . HgB A1c  . CBC  . Comp Met (CMET)  . Sed Rate (ESR)  . TSH    Meds ordered this  encounter  Medications  . buPROPion (ZYBAN) 150 MG 12 hr tablet    Sig: Take 1 tablet (150 mg) by mouth daily for 3 days, then take 1 tablet (150 mg) by mouth twice daily    Dispense:  60 tablet    Refill:  3  . rosuvastatin (CRESTOR) 20 MG tablet    Sig: Take 1 tablet (20 mg total) by  mouth daily.    Dispense:  90 tablet    Refill:  Stella, MD [place]

## 2016-01-26 NOTE — Assessment & Plan Note (Signed)
Patient with chronic symptoms in this area. Does have some slight neurological deficit in this area. No other neurological deficits. I discussed neurology evaluation for nerve conduction studies though she declined at this time and wanted to continue to monitor. If worsens would encourage neurology evaluation.

## 2016-01-26 NOTE — Assessment & Plan Note (Signed)
Patient is ready to quit. Will start on Wellbutrin.

## 2016-01-26 NOTE — Assessment & Plan Note (Addendum)
Patient's symptoms most consistent with hot flashes likely related to menopausal status. We will check lab work as outlined below to evaluate for this cause and other causes.

## 2016-01-26 NOTE — Assessment & Plan Note (Signed)
At goal. Continue current medications. 

## 2016-01-26 NOTE — Assessment & Plan Note (Signed)
Glucoses at home would appear to be well controlled. We'll check an A1c today. Continue current medication.

## 2016-01-26 NOTE — Patient Instructions (Signed)
Nice to see you. We will check lab work and call with results. Please keep your eye doctor appointment. Please monitor your hot flashes. Please start the Wellbutrin for smoking cessation. Please monitor the numbness and tingling you get in your right thigh. If this worsens let us know.

## 2016-01-26 NOTE — Assessment & Plan Note (Signed)
Did not tolerate Lipitor. Discussed starting on Crestor. She will discontinue the fenofibrate while trying the Crestor.

## 2016-01-27 LAB — FOLLICLE STIMULATING HORMONE: FSH: 55 m[IU]/mL

## 2016-01-27 LAB — TSH: TSH: 2.83 u[IU]/mL (ref 0.35–4.50)

## 2016-02-24 LAB — HM DIABETES EYE EXAM

## 2016-04-26 ENCOUNTER — Encounter: Payer: Self-pay | Admitting: Family Medicine

## 2016-04-26 ENCOUNTER — Ambulatory Visit (INDEPENDENT_AMBULATORY_CARE_PROVIDER_SITE_OTHER): Payer: Managed Care, Other (non HMO) | Admitting: Family Medicine

## 2016-04-26 VITALS — BP 122/74 | HR 87 | Temp 97.8°F | Wt 171.0 lb

## 2016-04-26 DIAGNOSIS — I10 Essential (primary) hypertension: Secondary | ICD-10-CM | POA: Diagnosis not present

## 2016-04-26 DIAGNOSIS — E119 Type 2 diabetes mellitus without complications: Secondary | ICD-10-CM | POA: Diagnosis not present

## 2016-04-26 DIAGNOSIS — E039 Hypothyroidism, unspecified: Secondary | ICD-10-CM | POA: Diagnosis not present

## 2016-04-26 DIAGNOSIS — Z720 Tobacco use: Secondary | ICD-10-CM | POA: Diagnosis not present

## 2016-04-26 DIAGNOSIS — M67912 Unspecified disorder of synovium and tendon, left shoulder: Secondary | ICD-10-CM

## 2016-04-26 DIAGNOSIS — Z23 Encounter for immunization: Secondary | ICD-10-CM

## 2016-04-26 LAB — COMPREHENSIVE METABOLIC PANEL
ALBUMIN: 4.6 g/dL (ref 3.5–5.2)
ALT: 64 U/L — ABNORMAL HIGH (ref 0–35)
AST: 27 U/L (ref 0–37)
Alkaline Phosphatase: 78 U/L (ref 39–117)
BUN: 17 mg/dL (ref 6–23)
CHLORIDE: 102 meq/L (ref 96–112)
CO2: 27 meq/L (ref 19–32)
CREATININE: 0.64 mg/dL (ref 0.40–1.20)
Calcium: 10 mg/dL (ref 8.4–10.5)
GFR: 103.83 mL/min (ref >60.00)
GLUCOSE: 142 mg/dL — AB (ref 70–99)
Potassium: 3.9 mEq/L (ref 3.5–5.1)
SODIUM: 138 meq/L (ref 135–145)
Total Bilirubin: 0.4 mg/dL (ref 0.2–1.2)
Total Protein: 7.3 g/dL (ref 6.0–8.3)

## 2016-04-26 LAB — HEMOGLOBIN A1C: Hgb A1c MFr Bld: 7.8 % — ABNORMAL HIGH (ref 4.6–6.5)

## 2016-04-26 LAB — TSH: TSH: 0.99 u[IU]/mL (ref 0.35–4.50)

## 2016-04-26 MED ORDER — LISINOPRIL 20 MG PO TABS: 20.0000 mg | ORAL_TABLET | Freq: Every day | ORAL | 3 refills | Status: DC

## 2016-04-26 MED ORDER — GLIMEPIRIDE 4 MG PO TABS: 4.0000 mg | ORAL_TABLET | Freq: Every day | ORAL | 3 refills | Status: DC

## 2016-04-26 MED ORDER — LIRAGLUTIDE 18 MG/3ML ~~LOC~~ SOPN: 1.8000 mg | PEN_INJECTOR | Freq: Every day | SUBCUTANEOUS | 3 refills | Status: DC

## 2016-04-26 MED ORDER — INSULIN PEN NEEDLE 32G X 4 MM MISC: 1 refills | Status: DC

## 2016-04-26 NOTE — Progress Notes (Signed)
Tommi Rumps, MD Phone: [phone]  Brenda Farmer is a 50 y.o. female who presents today for follow-up.  DIABETES Disease Monitoring: Blood Sugar ranges-not checking Polyuria/phagia/dipsia- no      Visual problems- no Medications: Compliance- taking victoza, metformin, glimeperide  Hypoglycemic symptoms- no  HYPERTENSION  Disease Monitoring  Home BP Monitoring not checking Chest pain- no    Dyspnea- no Medications  Compliance-  Taking HCTZ, lisiopril.  Edema- no  Tobacco abuse: Patient tried Wellbutrin though it "fried her brain". When she stopped it this resolved. She's half pack per day. She wants to try taking this to see if she can get used to it but she is on her break between semesters at school.  Rotator cuff tendinopathy: This is chronic and stable. Has pain in bilateral shoulders. Diclofenac does help. She does not want to do anything about her discomfort or MRI findings other than diclofenac at this time. Just about anything can cause her shoulders to hurt and then at other times they're fine.   PMH: Smoker   ROS see history of present illness  Objective  Physical Exam Vitals:   04/26/16 1425  BP: 122/74  Pulse: 87  Temp: 97.8 F (36.6 C)    BP Readings from Last 3 Encounters:  04/26/16 122/74  01/26/16 110/66  11/05/15 139/69   Wt Readings from Last 3 Encounters:  04/26/16 171 lb (77.6 kg)  01/26/16 174 lb 9.6 oz (79.2 kg)  11/05/15 173 lb (78.5 kg)    Physical Exam  Constitutional: No distress.  HENT:  Head: Normocephalic and atraumatic.  Cardiovascular: Normal rate, regular rhythm and normal heart sounds.   Pulmonary/Chest: Effort normal and breath sounds normal.  Musculoskeletal:  Bilateral shoulders no tenderness or defects palpated, full range of motion passively and actively with no discomfort  Neurological: She is alert. Gait normal.  Skin: Skin is warm and dry. She is not diaphoretic.     Assessment/Plan: Please see  individual problem list.  Benign essential HTN At goal. Continue current medications. Lab work as outlined below.  Controlled type 2 diabetes mellitus without complication (HCC) Continue current medications. A1c today.  Tobacco abuse Continues to have issues with this. I'm unsure if she is going to tolerate Wellbutrin though she wants to try it when she is out of school. If she cannot tolerate Wellbutrin she will let us know and we will try nicotine replacement.  Tendinopathy of left rotator cuff Unchanged. Continue diclofenac. Advised that when she is ready for further evaluation with orthopedics or sports medicine she can let us know and we can refer her.   Orders Placed This Encounter  Procedures  . Pneumococcal polysaccharide vaccine 23-valent greater than or equal to 2yo subcutaneous/IM  . Comp Met (CMET)  . TSH  . HgB A1c    Meds ordered this encounter  Medications  . Insulin Pen Needle (BD PEN NEEDLE NANO U/F) 32G X 4 MM MISC    Sig: Use once daily with Victoza    Dispense:  100 each    Refill:  1  . liraglutide (VICTOZA) 18 MG/3ML SOPN    Sig: Inject 0.3 mLs (1.8 mg total) into the skin daily.    Dispense:  9 mL    Refill:  3  . lisinopril (PRINIVIL,ZESTRIL) 20 MG tablet    Sig: Take 1 tablet (20 mg total) by mouth daily.    Dispense:  90 tablet    Refill:  3  . glimepiride (AMARYL) 4 MG tablet  Sig: Take 1 tablet (4 mg total) by mouth daily with breakfast.    Dispense:  90 tablet    Refill:  3    Tommi Rumps, MD [place]

## 2016-04-26 NOTE — Progress Notes (Signed)
Pre visit review using our clinic review tool, if applicable. No additional management support is needed unless otherwise documented below in the visit note. 

## 2016-04-26 NOTE — Assessment & Plan Note (Signed)
At goal. Continue current medications. Lab work as outlined below.

## 2016-04-26 NOTE — Assessment & Plan Note (Signed)
Continues to have issues with this. I'm unsure if she is going to tolerate Wellbutrin though she wants to try it when she is out of school. If she cannot tolerate Wellbutrin she will let us know and we will try nicotine replacement.

## 2016-04-26 NOTE — Assessment & Plan Note (Signed)
Unchanged. Continue diclofenac. Advised that when she is ready for further evaluation with orthopedics or sports medicine she can let us know and we can refer her.

## 2016-04-26 NOTE — Patient Instructions (Signed)
Nice to see you. I refilled your medications. Please try the Wellbutrin when you are out of school to see if this is beneficial for smoking cessation. Please contact your insurance company regarding your Victoza.

## 2016-04-26 NOTE — Assessment & Plan Note (Signed)
Continue current medications. A1c today.

## 2016-05-03 ENCOUNTER — Other Ambulatory Visit: Payer: Self-pay | Admitting: Family Medicine

## 2016-05-03 MED ORDER — EMPAGLIFLOZIN 10 MG PO TABS: 10.0000 mg | ORAL_TABLET | Freq: Every day | ORAL | 3 refills | Status: DC

## 2016-05-04 ENCOUNTER — Telehealth: Payer: Self-pay | Admitting: Family Medicine

## 2016-05-04 MED ORDER — LEVOTHYROXINE SODIUM 125 MCG PO TABS: 125.0000 ug | ORAL_TABLET | Freq: Every day | ORAL | 3 refills | Status: DC

## 2016-05-04 NOTE — Telephone Encounter (Signed)
Pt called and stated that Pharmacy faxed over a brand change for pt's levothyroxine (SYNTHROID, LEVOTHROID) 125 MCG tablet. Please advise, thank you!  Sandoval, Katie  Call pt @ [phone]

## 2016-05-04 NOTE — Telephone Encounter (Signed)
New RX sent to the pharmacy.

## 2016-06-01 ENCOUNTER — Other Ambulatory Visit: Payer: Self-pay | Admitting: Family Medicine

## 2016-06-01 NOTE — Telephone Encounter (Signed)
Filed under historical provider Please advise PA, Juliann Pulse

## 2016-06-01 NOTE — Telephone Encounter (Signed)
Pt called and stated that she needs a PA for her liraglutide (VICTOZA) 18 MG/3ML SOPN.   Pt also needs a refill on her diclofenac (VOLTAREN) 75 MG EC tablet. Please advise, thank you!  [place], [place]  Call pt @ [phone]

## 2016-06-02 MED ORDER — DICLOFENAC SODIUM 75 MG PO TBEC: 75.0000 mg | DELAYED_RELEASE_TABLET | Freq: Two times a day (BID) | ORAL | 2 refills | Status: DC | PRN

## 2016-06-02 NOTE — Telephone Encounter (Signed)
Diclofenac sent to pharmacy. Please check on Victoza PA. Thanks.

## 2016-06-05 ENCOUNTER — Telehealth: Payer: Self-pay | Admitting: Family Medicine

## 2016-06-05 NOTE — Telephone Encounter (Signed)
[employer] for PA for Victoza approval received Ref [id number], and patient has been notified,FYI

## 2016-06-05 NOTE — Telephone Encounter (Signed)
Noted  

## 2016-06-09 ENCOUNTER — Ambulatory Visit (INDEPENDENT_AMBULATORY_CARE_PROVIDER_SITE_OTHER): Payer: Managed Care, Other (non HMO) | Admitting: Family Medicine

## 2016-06-09 ENCOUNTER — Encounter: Payer: Self-pay | Admitting: Family Medicine

## 2016-06-09 DIAGNOSIS — J209 Acute bronchitis, unspecified: Secondary | ICD-10-CM | POA: Diagnosis not present

## 2016-06-09 MED ORDER — PREDNISONE 10 MG (21) PO TBPK: ORAL_TABLET | ORAL | 0 refills | Status: DC

## 2016-06-09 MED ORDER — AZITHROMYCIN 250 MG PO TABS
ORAL_TABLET | ORAL | 0 refills | Status: DC
Start: 2016-06-09 — End: 2016-07-26

## 2016-06-09 NOTE — Progress Notes (Signed)
Subjective:  Patient ID: Brenda Farmer, female    DOB: 1966-04-19  Age: 51 y.o. MRN: [id number]  CC: Bronchitis  HPI:  51 year old female with a history of RAD vs COPD (per EMR), OSA, DM-2, HTN, HLD presents with concerns for bronchitis.  Patient reports that she's been sick since Monday. Started with sore throat. Has progressed to off, congestion, and chest heaviness. She reports associated shortness of breath. Cough is nonproductive. She has been using albuterol and Mucinex without significant improvement. Symptoms are severe. No known exacerbating factors. No other associated symptoms. No other complaints or concerns at this time.  Social Hx   Social History   Social History  . Marital status: Married    Spouse name: N/A  . Number of children: N/A  . Years of education: N/A   Social History Main Topics  . Smoking status: Current Every Day Smoker    Packs/day: 0.50    Types: Cigarettes  . Smokeless tobacco: Never Used  . Alcohol use 0.0 - 0.6 oz/week     Comment: Rare usage   . Drug use: No  . Sexual activity: Yes    Partners: Male    Birth control/ protection: Surgical     Comment: Husband    Other Topics Concern  . None   Social History Narrative   Married    Children- 2    College Studying Criminal Justice    [employer] almost 1 year left    Caffeine- 2 cups in am coffee and 1 cup later, no soda       Review of Systems  Constitutional: Negative for fever.  HENT: Positive for congestion and sore throat.   Respiratory: Positive for cough and shortness of breath.    Objective:  BP 116/76   Pulse 97   Temp 99.2 F (37.3 C) (Oral)   Resp 16   Wt 171 lb 3.2 oz (77.7 kg)   SpO2 96%   BMI 35.78 kg/m   BP/Weight 06/09/2016 [id number]  Systolic BP 99991111 [id number]  Diastolic BP 76 74 66  Wt. (Lbs) 171.2 171 174.6  BMI 35.78 35.74 36.49   Physical Exam  Constitutional: She is oriented to person, place, and time. She appears well-developed. No distress.    HENT:  Mouth/Throat: Oropharynx is clear and moist.  Normal TM's bilaterally.  Cardiovascular: Regular rhythm.   Tachycardia.  Pulmonary/Chest: Effort normal and breath sounds normal.  Neurological: She is alert and oriented to person, place, and time.  Psychiatric: She has a normal mood and affect.  Vitals reviewed.  Lab Results  Component Value Date   WBC 9.2 01/26/2016   HGB 14.4 01/26/2016   HCT 41.8 01/26/2016   PLT 286.0 01/26/2016   GLUCOSE 142 (H) 04/26/2016   CHOL 265 (H) 11/01/2015   TRIG 266.0 (H) 11/01/2015   HDL 37.30 (L) 11/01/2015   LDLDIRECT 191.0 11/01/2015   LDLCALC 178 06/26/2014   ALT 64 (H) 04/26/2016   AST 27 04/26/2016   NA 138 04/26/2016   K 3.9 04/26/2016   CL 102 04/26/2016   CREATININE 0.64 04/26/2016   BUN 17 04/26/2016   CO2 27 04/26/2016   TSH 0.99 04/26/2016   HGBA1C 7.8 (H) 04/26/2016   MICROALBUR 8.7 (H) 06/13/2010    Assessment & Plan:   Problem List Items Addressed This Visit    Acute bronchitis    New acute problem. ? Hx of COPD. Treating with prednisone and azithromycin.  Meds ordered this encounter  Medications  . predniSONE (STERAPRED UNI-PAK 21 TAB) 10 MG (21) TBPK tablet    Sig: 6 tablets on day 1, then decrease by 1 tablet daily until gone.    Dispense:  21 tablet    Refill:  0  . azithromycin (ZITHROMAX) 250 MG tablet    Sig: 2 tablets on day 1, then 1 tablet daily on days 2-5.    Dispense:  6 tablet    Refill:  0    Follow-up: PRN  [place]

## 2016-06-09 NOTE — Progress Notes (Signed)
Pre visit review using our clinic review tool, if applicable. No additional management support is needed unless otherwise documented below in the visit note. 

## 2016-06-09 NOTE — Patient Instructions (Signed)
Take the prednisone and antibiotic as prescribed.  Feel better.  Dr. Lacinda Axon

## 2016-06-09 NOTE — Assessment & Plan Note (Signed)
New acute problem. ? Hx of COPD. Treating with prednisone and azithromycin.

## 2016-06-21 ENCOUNTER — Encounter: Payer: Self-pay | Admitting: Family Medicine

## 2016-06-21 ENCOUNTER — Ambulatory Visit (INDEPENDENT_AMBULATORY_CARE_PROVIDER_SITE_OTHER): Payer: Managed Care, Other (non HMO) | Admitting: Family Medicine

## 2016-06-21 VITALS — BP 110/70 | HR 78 | Temp 98.5°F | Wt 166.6 lb

## 2016-06-21 DIAGNOSIS — J0100 Acute maxillary sinusitis, unspecified: Secondary | ICD-10-CM | POA: Diagnosis not present

## 2016-06-21 DIAGNOSIS — M545 Low back pain, unspecified: Secondary | ICD-10-CM

## 2016-06-21 DIAGNOSIS — R1011 Right upper quadrant pain: Secondary | ICD-10-CM

## 2016-06-21 DIAGNOSIS — J329 Chronic sinusitis, unspecified: Secondary | ICD-10-CM | POA: Insufficient documentation

## 2016-06-21 DIAGNOSIS — R300 Dysuria: Secondary | ICD-10-CM | POA: Diagnosis not present

## 2016-06-21 DIAGNOSIS — K7581 Nonalcoholic steatohepatitis (NASH): Secondary | ICD-10-CM

## 2016-06-21 LAB — COMPREHENSIVE METABOLIC PANEL
ALT: 59 U/L — ABNORMAL HIGH (ref 0–35)
AST: 27 U/L (ref 0–37)
Albumin: 4.7 g/dL (ref 3.5–5.2)
Alkaline Phosphatase: 72 U/L (ref 39–117)
BUN: 18 mg/dL (ref 6–23)
CHLORIDE: 102 meq/L (ref 96–112)
CO2: 29 meq/L (ref 19–32)
Calcium: 10.2 mg/dL (ref 8.4–10.5)
Creatinine, Ser: 0.67 mg/dL (ref 0.40–1.20)
GFR: 98.43 mL/min (ref >60.00)
GLUCOSE: 116 mg/dL — AB (ref 70–99)
POTASSIUM: 4.3 meq/L (ref 3.5–5.1)
SODIUM: 138 meq/L (ref 135–145)
Total Bilirubin: 0.4 mg/dL (ref 0.2–1.2)
Total Protein: 7.4 g/dL (ref 6.0–8.3)

## 2016-06-21 LAB — POCT URINALYSIS DIPSTICK
Bilirubin, UA: NEGATIVE
Glucose, UA: 3
Ketones, UA: NEGATIVE
Leukocytes, UA: NEGATIVE
NITRITE UA: NEGATIVE
PH UA: 6
PROTEIN UA: NEGATIVE
RBC UA: NEGATIVE
SPEC GRAV UA: 1.02
UROBILINOGEN UA: NEGATIVE

## 2016-06-21 LAB — LIPASE: LIPASE: 44 U/L (ref 11.0–59.0)

## 2016-06-21 MED ORDER — AMOXICILLIN-POT CLAVULANATE 875-125 MG PO TABS
1.0000 | ORAL_TABLET | Freq: Two times a day (BID) | ORAL | 0 refills | Status: DC
Start: 2016-06-21 — End: 2016-07-26

## 2016-06-21 NOTE — Assessment & Plan Note (Signed)
Suspect upper respiration symptoms are related to sinusitis. We'll treat with Augmentin. She's given return precautions.

## 2016-06-21 NOTE — Progress Notes (Signed)
Pre visit review using our clinic review tool, if applicable. No additional management support is needed unless otherwise documented below in the visit note. 

## 2016-06-21 NOTE — Progress Notes (Signed)
Tommi Rumps, MD Phone: [phone]  Brenda Farmer is a 51 y.o. female who presents today for f/u.  Patient notes since Monday she has had some mild abdominal discomfort and low back pain with dark brown urine. She notes the discomfort in her abdomen is right upper quadrant and right flank. Notes it comes and goes and feels as though it is a pressure and occasionally stabbing pain. She notes no blood in her urine. No dysuria. Does note some frequency and urgency. No nausea or vomiting. A little diarrhea earlier in the week. Normal colored stools. Does still have her gallbladder though she reports she's had this right upper quadrant pain intermittently over the last 20 years and has had this evaluated multiple times. Typically occurs after her being sick. Notes chronic right lateral thigh numbness that is intermittent if she stands for too long. This is unchanged. No numbness or weakness at this time. No saddle anesthesia, loss of bowel or bladder function, or fevers.  She notes continued sinus congestion and pressure with blowing thick gross mucus out of her nose. Notes frontal and maxillary sinus congestion. She was treated with azithromycin and prednisone for bronchitis. Notes some right ear discomfort. No fevers. Her bronchitis symptoms improved though the sinus congestion did not.  PMH: Smoker   ROS see history of present illness  Objective  Physical Exam Vitals:   06/21/16 0820  BP: 110/70  Pulse: 78  Temp: 98.5 F (36.9 C)    BP Readings from Last 3 Encounters:  06/21/16 110/70  06/09/16 116/76  04/26/16 122/74   Wt Readings from Last 3 Encounters:  06/21/16 166 lb 9.6 oz (75.6 kg)  06/09/16 171 lb 3.2 oz (77.7 kg)  04/26/16 171 lb (77.6 kg)    Physical Exam  Constitutional: No distress.  HENT:  Head: Normocephalic and atraumatic.  Mouth/Throat: Oropharynx is clear and moist. No oropharyngeal exudate.  Normal TMs bilaterally  Eyes: Conjunctivae are normal.  Pupils are equal, round, and reactive to light.  Neck: Neck supple.  Cardiovascular: Normal rate, regular rhythm and normal heart sounds.   Pulmonary/Chest: Effort normal and breath sounds normal.  Abdominal: Soft. Bowel sounds are normal. She exhibits no distension and no mass. There is tenderness (minimal right upper). There is no rebound and no guarding.  Musculoskeletal:  No midline spine tenderness, no midline spine step-off, no muscular back  Lymphadenopathy:    She has no cervical adenopathy.  Neurological: She is alert. Gait normal.  Skin: Skin is warm and dry. She is not diaphoretic.     Assessment/Plan: Please see individual problem list.  Sinusitis Suspect upper respiration symptoms are related to sinusitis. We'll treat with Augmentin. She's given return precautions.  NASH (nonalcoholic steatohepatitis) With history of NASH. Notes chronic history of intermittent right upper quadrant discomfort. Mild tenderness today. No peritoneal signs. She notes some urinary frequency and urgency though her urinalysis is unremarkable. Discomfort could be related to her steatohepatitis versus gallbladder pathology versus pancreatic pathology. Doubt UTI though we will send urine for culture. We will obtain a CMP and a lipase. Discussed that if she has any change or worsening in her symptoms she should be evaluated immediately.  Low back pain Suspect musculoskeletal cause. Neurologically intact or lower extremities. No red flags. Doubt related to UTI. She'll continue to monitor.   Orders Placed This Encounter  Procedures  . Urine Culture  . Comp Met (CMET)  . Lipase  . POCT Urinalysis Dipstick    Meds ordered this encounter  Medications  . amoxicillin-clavulanate (AUGMENTIN) 875-125 MG tablet    Sig: Take 1 tablet by mouth 2 (two) times daily.    Dispense:  14 tablet    Refill:  0    Tommi Rumps, MD [place]

## 2016-06-21 NOTE — Assessment & Plan Note (Signed)
Suspect musculoskeletal cause. Neurologically intact or lower extremities. No red flags. Doubt related to UTI. She'll continue to monitor.

## 2016-06-21 NOTE — Patient Instructions (Signed)
Nice to see you. We will treat you for a sinus infection with Augmentin. We will obtain some lab work to evaluate your abdominal discomfort. If you have worsening abdominal discomfort, developed nausea, vomiting, diarrhea, bloody stool, or any new or changing symptoms please seek medical attention immediately.

## 2016-06-21 NOTE — Assessment & Plan Note (Signed)
With history of NASH. Notes chronic history of intermittent right upper quadrant discomfort. Mild tenderness today. No peritoneal signs. She notes some urinary frequency and urgency though her urinalysis is unremarkable. Discomfort could be related to her steatohepatitis versus gallbladder pathology versus pancreatic pathology. Doubt UTI though we will send urine for culture. We will obtain a CMP and a lipase. Discussed that if she has any change or worsening in her symptoms she should be evaluated immediately.

## 2016-06-22 LAB — URINE CULTURE

## 2016-07-26 ENCOUNTER — Ambulatory Visit (INDEPENDENT_AMBULATORY_CARE_PROVIDER_SITE_OTHER): Payer: Managed Care, Other (non HMO) | Admitting: Family Medicine

## 2016-07-26 ENCOUNTER — Encounter: Payer: Self-pay | Admitting: Family Medicine

## 2016-07-26 VITALS — BP 100/60 | HR 92 | Temp 98.0°F | Ht 59.0 in | Wt 165.4 lb

## 2016-07-26 DIAGNOSIS — E039 Hypothyroidism, unspecified: Secondary | ICD-10-CM

## 2016-07-26 DIAGNOSIS — E669 Obesity, unspecified: Secondary | ICD-10-CM

## 2016-07-26 DIAGNOSIS — I10 Essential (primary) hypertension: Secondary | ICD-10-CM

## 2016-07-26 DIAGNOSIS — G4733 Obstructive sleep apnea (adult) (pediatric): Secondary | ICD-10-CM | POA: Diagnosis not present

## 2016-07-26 DIAGNOSIS — E119 Type 2 diabetes mellitus without complications: Secondary | ICD-10-CM

## 2016-07-26 DIAGNOSIS — E663 Overweight: Secondary | ICD-10-CM | POA: Insufficient documentation

## 2016-07-26 DIAGNOSIS — E66811 Obesity, class 1: Secondary | ICD-10-CM

## 2016-07-26 LAB — POCT GLYCOSYLATED HEMOGLOBIN (HGB A1C): HEMOGLOBIN A1C: 6.8

## 2016-07-26 NOTE — Assessment & Plan Note (Signed)
Weight has continued to trend down. She has made some diet changes and has been more active. She'll continue with these changes.

## 2016-07-26 NOTE — Progress Notes (Signed)
Pre visit review using our clinic review tool, if applicable. No additional management support is needed unless otherwise documented below in the visit note. 

## 2016-07-26 NOTE — Patient Instructions (Signed)
Nice to see you. We will continue your current medications. Please continue to monitor your blood sugars if they start to run low please let us know. If they run low please eat something and check her sugar.

## 2016-07-26 NOTE — Progress Notes (Signed)
  Tommi Rumps, MD Phone: [phone]  Brenda Farmer is a 51 y.o. female who presents today for f/u.  HYPERTENSION  Disease Monitoring  Home BP Monitoring not checking Chest pain- no    Dyspnea- no Medications  Compliance-  Taking HCTZ, lisinopril.  Edema- no  DIABETES Disease Monitoring: Blood Sugar ranges-typically 120-140 Polyuria/phagia/dipsia- no      UTD on optho Medications: Compliance- taking Jardiance, glimepiride, Victoza, metformin Hypoglycemic symptoms- happened on 2 occasions several weeks ago. Reports she ate special K cereal and then later felt like her blood sugar was low and checked it and it was 35. She cut out the Special K cereal and this has not recurred.  Hypothyroidism: Taking her Synthroid. Notes her weight is down a little bit though she has changed her diet. Does not some dry skin. Does note some cold intolerance.  Patient has decreased the amount of food she's eaten and has also cut out a number of biscuits during the week. She is also walking more places than typical. She's going up the stairs at work and stopped taking the elevator. She is down 60 pounds over the last 5 years.  OSA: Wears her CPAP greater than 4 hours a night. Generally wakes well rested and is not sleepy during the day.  PMH: Smoker   ROS see history of present illness  Objective  Physical Exam Vitals:   07/26/16 1412  BP: 100/60  Pulse: 92  Temp: 98 F (36.7 C)    BP Readings from Last 3 Encounters:  07/26/16 100/60  06/21/16 110/70  06/09/16 116/76   Wt Readings from Last 3 Encounters:  07/26/16 165 lb 6.4 oz (75 kg)  06/21/16 166 lb 9.6 oz (75.6 kg)  06/09/16 171 lb 3.2 oz (77.7 kg)    Physical Exam  Constitutional: No distress.  Cardiovascular: Normal rate, regular rhythm and normal heart sounds.   Pulmonary/Chest: Effort normal and breath sounds normal.  Musculoskeletal: She exhibits no edema.  Neurological: She is alert. Gait normal.  Skin: Skin is  warm and dry. She is not diaphoretic.     Assessment/Plan: Please see individual problem list.  Benign essential HTN At goal. Continue current medications.  Hypothyroidism Most recent TSH normal. Continue Synthroid.  Controlled type 2 diabetes mellitus without complication (HCC) Seems to be very well controlled. We'll check an A1c. Advised to monitor for recurrent lows. If these occur she should let us know.  OSA (obstructive sleep apnea) Wears CPAP consistently. Stable. Continue to follow.  Obesity (BMI 30.0-34.9) Weight has continued to trend down. She has made some diet changes and has been more active. She'll continue with these changes.   Orders Placed This Encounter  Procedures  . POCT HgB A1C    Tommi Rumps, MD [place]

## 2016-07-26 NOTE — Assessment & Plan Note (Signed)
Wears CPAP consistently. Stable. Continue to follow.

## 2016-07-26 NOTE — Assessment & Plan Note (Signed)
Seems to be very well controlled. We'll check an A1c. Advised to monitor for recurrent lows. If these occur she should let us know.

## 2016-07-26 NOTE — Assessment & Plan Note (Signed)
At goal. Continue current medications. 

## 2016-07-26 NOTE — Assessment & Plan Note (Signed)
Most recent TSH normal. Continue Synthroid.

## 2016-09-05 ENCOUNTER — Other Ambulatory Visit: Payer: Self-pay | Admitting: Family Medicine

## 2016-09-05 ENCOUNTER — Other Ambulatory Visit: Payer: Self-pay | Admitting: Nurse Practitioner

## 2016-09-05 NOTE — Telephone Encounter (Signed)
Last OV 07/26/16 last filled 06/02/16 60 2rf patient would like 90 days

## 2016-09-06 ENCOUNTER — Other Ambulatory Visit: Payer: Self-pay | Admitting: Nurse Practitioner

## 2016-11-07 ENCOUNTER — Other Ambulatory Visit: Payer: Self-pay | Admitting: Family Medicine

## 2016-11-08 ENCOUNTER — Ambulatory Visit (INDEPENDENT_AMBULATORY_CARE_PROVIDER_SITE_OTHER): Payer: 59 | Admitting: Family Medicine

## 2016-11-08 ENCOUNTER — Encounter: Payer: Self-pay | Admitting: Family Medicine

## 2016-11-08 VITALS — BP 100/62 | HR 80 | Temp 98.6°F | Wt 168.4 lb

## 2016-11-08 DIAGNOSIS — R350 Frequency of micturition: Secondary | ICD-10-CM

## 2016-11-08 DIAGNOSIS — E785 Hyperlipidemia, unspecified: Secondary | ICD-10-CM

## 2016-11-08 DIAGNOSIS — Z720 Tobacco use: Secondary | ICD-10-CM | POA: Diagnosis not present

## 2016-11-08 DIAGNOSIS — Z23 Encounter for immunization: Secondary | ICD-10-CM | POA: Diagnosis not present

## 2016-11-08 DIAGNOSIS — E119 Type 2 diabetes mellitus without complications: Secondary | ICD-10-CM

## 2016-11-08 HISTORY — DX: Frequency of micturition: R35.0

## 2016-11-08 LAB — POCT URINALYSIS DIPSTICK
Leukocytes, UA: NEGATIVE
NITRITE UA: NEGATIVE
Protein, UA: NEGATIVE
Spec Grav, UA: 1.01 (ref 1.010–1.025)
Urobilinogen, UA: 0.2 E.U./dL
pH, UA: 5.5 (ref 5.0–8.0)

## 2016-11-08 LAB — POCT GLYCOSYLATED HEMOGLOBIN (HGB A1C): Hemoglobin A1C: 7

## 2016-11-08 LAB — MICROALBUMIN / CREATININE URINE RATIO
CREATININE, U: 106.1 mg/dL
MICROALB/CREAT RATIO: 1.3 mg/g (ref 0.0–30.0)
Microalb, Ur: 1.4 mg/dL (ref 0.0–1.9)

## 2016-11-08 LAB — URINALYSIS, MICROSCOPIC ONLY: WBC UA: NONE SEEN (ref 0–?)

## 2016-11-08 MED ORDER — LIRAGLUTIDE 18 MG/3ML ~~LOC~~ SOPN: 0.6000 mg | PEN_INJECTOR | Freq: Every day | SUBCUTANEOUS | 3 refills | Status: DC

## 2016-11-08 NOTE — Patient Instructions (Addendum)
Nice to see you. Please set up a lab appointment for fasting lab work. You can start back on the Wellbutrin. You should take 150 mg once daily by mouth for 3 days and then increase to 150 mg twice daily by mouth. If you have continued issues with concentration mole on this please let us know and we will need to try nicotine replacement.

## 2016-11-08 NOTE — Assessment & Plan Note (Signed)
Tolerating Crestor. We'll have her return for fasting lab work.

## 2016-11-08 NOTE — Assessment & Plan Note (Signed)
Reports some urinary frequency. No other symptoms with this. Suspect this is related to her increased liquid intake though we will obtain UA to evaluate.

## 2016-11-08 NOTE — Assessment & Plan Note (Addendum)
Home glucoses appear to be very well controlled. A1c 7 today. We will continue her current regimen. Advised to monitor for low blood sugars. If she has recurrent lows she will discontinue the Victoza. She'll contact us as well. She did voice understanding of hypoglycemia protocol.

## 2016-11-08 NOTE — Progress Notes (Signed)
  Brenda Rumps, MD Phone: [phone]  ARRYN Farmer is a 51 y.o. female who presents today for follow-up.  DIABETES Disease Monitoring: Blood Sugar ranges-80-100, patient reports she was having some low sugars down into the 30s at times. She decreased her Victoza dose to 0.6 mg daily and her blood sugars have typically been running between 80 and 100. Polyuria/phagia/dipsia- does note some polyuria      Optho- UTD Medications: Compliance- taking Jardiance, glimepiride, Victoza, metformin Hypoglycemic symptoms- yes, when her blood sugars were in the 30s, she would eat something and improve She notes no dysuria or urinary urgency. She does note increased fluid intake.  Tobacco abuse: She did try the Wellbutrin though it altered her concentration and she was in school so she discontinued it. She is ready to restart it. She is smoking half a pack to three quarters of a pack a day.  Hyperlipidemia: Taking Crestor. No chest pain, myalgias, or right upper quadrant pain.  PMH: Smoker   ROS see history of present illness  Objective  Physical Exam Vitals:   11/08/16 1002  BP: 100/62  Pulse: 80  Temp: 98.6 F (37 C)    BP Readings from Last 3 Encounters:  11/08/16 100/62  07/26/16 100/60  06/21/16 110/70   Wt Readings from Last 3 Encounters:  11/08/16 168 lb 6.4 oz (76.4 kg)  07/26/16 165 lb 6.4 oz (75 kg)  06/21/16 166 lb 9.6 oz (75.6 kg)    Physical Exam  Constitutional: No distress.  Cardiovascular: Normal rate, regular rhythm and normal heart sounds.   Pulmonary/Chest: Effort normal and breath sounds normal.  Musculoskeletal: She exhibits no edema.  Neurological: She is alert. Gait normal.  Skin: She is not diaphoretic.     Assessment/Plan: Please see individual problem list.  Hyperlipidemia Tolerating Crestor. We'll have her return for fasting lab work.  Tobacco abuse She will restart on Wellbutrin at her request. Given instructions in AVS. If she has  issues with this she will let us know.  Controlled type 2 diabetes mellitus without complication ([place]) Home glucoses appear to be very well controlled. A1c 7 today. We will continue her current regimen. Advised to monitor for low blood sugars. If she has recurrent lows she will discontinue the Victoza. She'll contact us as well. She did voice understanding of hypoglycemia protocol.   Urine frequency Reports some urinary frequency. No other symptoms with this. Suspect this is related to her increased liquid intake though we will obtain UA to evaluate.   Orders Placed This Encounter  Procedures  . Lipid panel    Standing Status:   Future    Standing Expiration Date:   11/08/2017  . Comp Met (CMET)    Standing Status:   Future    Standing Expiration Date:   11/08/2017  . Urine Microalbumin w/creat. ratio  . POCT HgB A1C  . POCT Urinalysis Dipstick   Brenda Rumps, MD [place]

## 2016-11-08 NOTE — Assessment & Plan Note (Signed)
She will restart on Wellbutrin at her request. Given instructions in AVS. If she has issues with this she will let us know.

## 2016-11-09 ENCOUNTER — Telehealth: Payer: Self-pay

## 2016-11-09 LAB — URINE CULTURE

## 2016-11-09 NOTE — Telephone Encounter (Signed)
PA for Victoza completed on cover my meds

## 2016-11-17 ENCOUNTER — Other Ambulatory Visit (INDEPENDENT_AMBULATORY_CARE_PROVIDER_SITE_OTHER): Payer: 59

## 2016-11-17 DIAGNOSIS — E785 Hyperlipidemia, unspecified: Secondary | ICD-10-CM | POA: Diagnosis not present

## 2016-11-17 LAB — COMPREHENSIVE METABOLIC PANEL
ALBUMIN: 4.6 g/dL (ref 3.5–5.2)
ALT: 35 U/L (ref 0–35)
AST: 21 U/L (ref 0–37)
Alkaline Phosphatase: 60 U/L (ref 39–117)
BUN: 23 mg/dL (ref 6–23)
CALCIUM: 10.1 mg/dL (ref 8.4–10.5)
CHLORIDE: 102 meq/L (ref 96–112)
CO2: 28 mEq/L (ref 19–32)
CREATININE: 0.75 mg/dL (ref 0.40–1.20)
GFR: 86.27 mL/min (ref >60.00)
Glucose, Bld: 126 mg/dL — ABNORMAL HIGH (ref 70–99)
POTASSIUM: 4.4 meq/L (ref 3.5–5.1)
Sodium: 137 mEq/L (ref 135–145)
Total Bilirubin: 0.4 mg/dL (ref 0.2–1.2)
Total Protein: 7.3 g/dL (ref 6.0–8.3)

## 2016-11-17 LAB — LIPID PANEL
CHOLESTEROL: 134 mg/dL (ref 0–200)
HDL: 33.6 mg/dL — ABNORMAL LOW (ref >39.00)
LDL CALC: 64 mg/dL (ref 0–99)
NonHDL: 100.3
TRIGLYCERIDES: 184 mg/dL — AB (ref 0.0–149.0)
Total CHOL/HDL Ratio: 4
VLDL: 36.8 mg/dL (ref 0.0–40.0)

## 2016-12-14 ENCOUNTER — Other Ambulatory Visit: Payer: Self-pay | Admitting: Family Medicine

## 2016-12-14 MED ORDER — METFORMIN HCL ER 500 MG PO TB24: ORAL_TABLET | ORAL | 11 refills | Status: DC

## 2016-12-14 NOTE — Telephone Encounter (Signed)
Pt called requesting a refill on her metformin. She only has a couple of pills left.Please advise, thank you!  [place] [address], [place]

## 2016-12-14 NOTE — Telephone Encounter (Signed)
Refilled

## 2016-12-16 ENCOUNTER — Other Ambulatory Visit: Payer: Self-pay | Admitting: Nurse Practitioner

## 2017-01-29 ENCOUNTER — Encounter: Payer: Self-pay | Admitting: Family Medicine

## 2017-01-29 ENCOUNTER — Other Ambulatory Visit: Payer: Self-pay | Admitting: Family Medicine

## 2017-01-30 MED ORDER — EMPAGLIFLOZIN 10 MG PO TABS: 10.0000 mg | ORAL_TABLET | Freq: Every day | ORAL | 3 refills | Status: DC

## 2017-01-30 MED ORDER — METFORMIN HCL ER 500 MG PO TB24: ORAL_TABLET | ORAL | 1 refills | Status: DC

## 2017-01-30 MED ORDER — HYDROCHLOROTHIAZIDE 25 MG PO TABS: 25.0000 mg | ORAL_TABLET | Freq: Every day | ORAL | 0 refills | Status: DC

## 2017-01-30 MED ORDER — ROSUVASTATIN CALCIUM 20 MG PO TABS: 20.0000 mg | ORAL_TABLET | Freq: Every day | ORAL | 3 refills | Status: DC

## 2017-02-09 ENCOUNTER — Ambulatory Visit: Payer: 59 | Admitting: Family Medicine

## 2017-02-16 ENCOUNTER — Encounter: Payer: Self-pay | Admitting: Family Medicine

## 2017-02-16 ENCOUNTER — Ambulatory Visit (INDEPENDENT_AMBULATORY_CARE_PROVIDER_SITE_OTHER): Payer: 59 | Admitting: Family Medicine

## 2017-02-16 VITALS — BP 110/60 | HR 88 | Temp 99.0°F | Resp 16 | Wt 170.1 lb

## 2017-02-16 DIAGNOSIS — I10 Essential (primary) hypertension: Secondary | ICD-10-CM | POA: Diagnosis not present

## 2017-02-16 DIAGNOSIS — Z23 Encounter for immunization: Secondary | ICD-10-CM

## 2017-02-16 DIAGNOSIS — E039 Hypothyroidism, unspecified: Secondary | ICD-10-CM | POA: Diagnosis not present

## 2017-02-16 DIAGNOSIS — E119 Type 2 diabetes mellitus without complications: Secondary | ICD-10-CM | POA: Diagnosis not present

## 2017-02-16 DIAGNOSIS — Z720 Tobacco use: Secondary | ICD-10-CM

## 2017-02-16 NOTE — Patient Instructions (Signed)
Nice to see you. I am glad you are doing well. Please quit smoking. We will call you with your lab work.

## 2017-02-16 NOTE — Assessment & Plan Note (Signed)
Check TSH 

## 2017-02-16 NOTE — Assessment & Plan Note (Signed)
Well controlled. Continue current medications  

## 2017-02-16 NOTE — Assessment & Plan Note (Signed)
Encouraged to quit smoking.  

## 2017-02-16 NOTE — Assessment & Plan Note (Signed)
Check A1c. Continue current medications. Monitor for hypoglycemia.

## 2017-02-16 NOTE — Progress Notes (Signed)
  Tommi Rumps, MD Phone: [phone]  Brenda Farmer is a 51 y.o. female who presents today for f/u.  DIABETES Disease Monitoring: Blood Sugar ranges-90-100 Polyuria/phagia/dipsia- no      Medications: Compliance- taking Jardiance, glimepiride, Victoza, metformin Hypoglycemic symptoms- on a couple of occasions when she took the next highest dose of Victoza. She's currently taking 0.6 mg daily. Drinks milk and this improves.  Hypothyroidism: Taking Synthroid. No significant skin changes. No heat or cold intolerance. No weight changes.  Hypertension: Not checking. Taking HCTZ and lisinopril. No chest pain or shortness of breath. No edema.  Tobacco abuse: Patient tried Zyban. This made her feel fuzzy. She discontinued it. It did not help. She thinks she'll just have to do will power.   PMH: Smoker ROS see history of present illness  Objective  Physical Exam Vitals:   02/16/17 1559  BP: 110/60  Pulse: 88  Resp: 16  Temp: 99 F (37.2 C)  SpO2: 96%    BP Readings from Last 3 Encounters:  02/16/17 110/60  11/08/16 100/62  07/26/16 100/60   Wt Readings from Last 3 Encounters:  02/16/17 170 lb 2 oz (77.2 kg)  11/08/16 168 lb 6.4 oz (76.4 kg)  07/26/16 165 lb 6.4 oz (75 kg)    Physical Exam  Constitutional: No distress.  Cardiovascular: Normal rate, regular rhythm and normal heart sounds.   Pulmonary/Chest: Effort normal and breath sounds normal.  Neurological: She is alert. Gait normal.  Skin: Skin is warm and dry. She is not diaphoretic.     Assessment/Plan: Please see individual problem list.  Benign essential HTN Well-controlled. Continue current medications.  Hypothyroidism Check TSH.  Controlled type 2 diabetes mellitus without complication (HCC) Check A1c. Continue current medications. Monitor for hypoglycemia.  Tobacco abuse Encouraged to quit smoking.   Orders Placed This Encounter  Procedures  . Flu Vaccine QUAD 36+ mos IM  . HgB A1c    . TSH   Tommi Rumps, MD [place]

## 2017-02-17 LAB — HEMOGLOBIN A1C
Hgb A1c MFr Bld: 6.5 % of total Hgb — ABNORMAL HIGH (ref <5.7)
Mean Plasma Glucose: 140 (calc)
eAG (mmol/L): 7.7 (calc)

## 2017-02-17 LAB — TSH: TSH: 2.46 m[IU]/L

## 2017-05-20 ENCOUNTER — Other Ambulatory Visit: Payer: Self-pay | Admitting: Family Medicine

## 2017-06-15 ENCOUNTER — Other Ambulatory Visit: Payer: Self-pay | Admitting: Family Medicine

## 2017-07-26 ENCOUNTER — Other Ambulatory Visit: Payer: Self-pay | Admitting: Family Medicine

## 2017-08-17 ENCOUNTER — Ambulatory Visit: Payer: 59 | Admitting: Family Medicine

## 2017-09-21 ENCOUNTER — Ambulatory Visit: Payer: BLUE CROSS/BLUE SHIELD | Admitting: Family Medicine

## 2017-09-24 ENCOUNTER — Other Ambulatory Visit: Payer: Self-pay | Admitting: Family Medicine

## 2017-09-27 ENCOUNTER — Ambulatory Visit: Payer: BLUE CROSS/BLUE SHIELD | Admitting: Family Medicine

## 2017-10-19 ENCOUNTER — Ambulatory Visit (INDEPENDENT_AMBULATORY_CARE_PROVIDER_SITE_OTHER): Payer: BLUE CROSS/BLUE SHIELD | Admitting: Family Medicine

## 2017-10-19 ENCOUNTER — Ambulatory Visit (INDEPENDENT_AMBULATORY_CARE_PROVIDER_SITE_OTHER): Payer: BLUE CROSS/BLUE SHIELD

## 2017-10-19 ENCOUNTER — Encounter: Payer: Self-pay | Admitting: Family Medicine

## 2017-10-19 VITALS — BP 120/68 | HR 79 | Temp 98.3°F | Resp 15 | Ht 59.0 in | Wt 172.6 lb

## 2017-10-19 DIAGNOSIS — Z1231 Encounter for screening mammogram for malignant neoplasm of breast: Secondary | ICD-10-CM | POA: Diagnosis not present

## 2017-10-19 DIAGNOSIS — R202 Paresthesia of skin: Secondary | ICD-10-CM | POA: Diagnosis not present

## 2017-10-19 DIAGNOSIS — R229 Localized swelling, mass and lump, unspecified: Secondary | ICD-10-CM

## 2017-10-19 DIAGNOSIS — E119 Type 2 diabetes mellitus without complications: Secondary | ICD-10-CM | POA: Diagnosis not present

## 2017-10-19 DIAGNOSIS — M5416 Radiculopathy, lumbar region: Secondary | ICD-10-CM

## 2017-10-19 DIAGNOSIS — E039 Hypothyroidism, unspecified: Secondary | ICD-10-CM | POA: Diagnosis not present

## 2017-10-19 DIAGNOSIS — R200 Anesthesia of skin: Secondary | ICD-10-CM

## 2017-10-19 DIAGNOSIS — E785 Hyperlipidemia, unspecified: Secondary | ICD-10-CM | POA: Diagnosis not present

## 2017-10-19 DIAGNOSIS — Z1239 Encounter for other screening for malignant neoplasm of breast: Secondary | ICD-10-CM

## 2017-10-19 DIAGNOSIS — I10 Essential (primary) hypertension: Secondary | ICD-10-CM

## 2017-10-19 HISTORY — DX: Localized swelling, mass and lump, unspecified: R22.9

## 2017-10-19 LAB — COMPREHENSIVE METABOLIC PANEL
ALBUMIN: 4.8 g/dL (ref 3.5–5.2)
ALK PHOS: 64 U/L (ref 39–117)
ALT: 34 U/L (ref 0–35)
AST: 23 U/L (ref 0–37)
BUN: 19 mg/dL (ref 6–23)
CALCIUM: 10.1 mg/dL (ref 8.4–10.5)
CHLORIDE: 101 meq/L (ref 96–112)
CO2: 28 mEq/L (ref 19–32)
Creatinine, Ser: 0.61 mg/dL (ref 0.40–1.20)
GFR: 109.11 mL/min (ref >60.00)
Glucose, Bld: 123 mg/dL — ABNORMAL HIGH (ref 70–99)
POTASSIUM: 4.1 meq/L (ref 3.5–5.1)
Sodium: 138 mEq/L (ref 135–145)
TOTAL PROTEIN: 7.5 g/dL (ref 6.0–8.3)
Total Bilirubin: 0.4 mg/dL (ref 0.2–1.2)

## 2017-10-19 LAB — LIPID PANEL
CHOLESTEROL: 148 mg/dL (ref 0–200)
HDL: 38.8 mg/dL — AB (ref >39.00)
LDL Cholesterol: 72 mg/dL (ref 0–99)
NONHDL: 108.92
Total CHOL/HDL Ratio: 4
Triglycerides: 183 mg/dL — ABNORMAL HIGH (ref 0.0–149.0)
VLDL: 36.6 mg/dL (ref 0.0–40.0)

## 2017-10-19 LAB — TSH: TSH: 3.47 u[IU]/mL (ref 0.35–4.50)

## 2017-10-19 LAB — HEMOGLOBIN A1C: HEMOGLOBIN A1C: 7.6 % — AB (ref 4.6–6.5)

## 2017-10-19 NOTE — Assessment & Plan Note (Signed)
Check lipid panel.  Continue Crestor.

## 2017-10-19 NOTE — Assessment & Plan Note (Signed)
Check A1c.  She will discontinue the omeprazole given her lows.  Continue Jardiance and metformin.  Request ophthalmology records.  Foot exam completed.

## 2017-10-19 NOTE — Patient Instructions (Signed)
Nice to see you. We will check lab work today and contact you with the results. Please discontinue your glimepiride.  Please continue to watch your blood sugars if they continue to drop low over the next week after discontinuing that please let us know. Please monitor the nodule on your left hand and if it continues to bother you we could obtain an x-ray or refer you. We will get an x-ray of your low back today and contact you with the results. We will get you set up for mammogram.

## 2017-10-19 NOTE — Assessment & Plan Note (Signed)
Well-controlled.  Continue current regimen. 

## 2017-10-19 NOTE — Assessment & Plan Note (Signed)
Check TSH 

## 2017-10-19 NOTE — Assessment & Plan Note (Signed)
On her left hand.  This is been chronically there.  She wonders if she had something get in the area just has not come out.  I did discuss obtaining an x-ray to evaluate for foreign body or referral to sports medicine or a hand surgeon though she deferred both of these.  She will monitor and if it does not improve or if it worsens let us know.

## 2017-10-19 NOTE — Assessment & Plan Note (Signed)
Continues to have issues with this.  Will start with lumbar spine imaging with an x-ray.  Consider physical therapy.  Consider neurology evaluation given chronicity.

## 2017-10-19 NOTE — Progress Notes (Signed)
Tommi Rumps, MD Phone: [phone]  Brenda Farmer is a 52 y.o. female who presents today for f/u.  CC: htn, hld, dm  HYPERTENSION Disease Monitoring: Blood pressure range-100/70  Chest pain- no      Dyspnea- no Medications: Compliance- taking HCTZ, lisinopril   Edema- no  DIABETES Disease Monitoring: Blood Sugar ranges-90-120, though has had lows to the 70s Polyuria/phagia/dipsia- no      Optho- UTD, requesting records Medications: Compliance- taking jardiance, glimeperide, metformin, she discontinued victoza due to lows Hypoglycemic symptoms- yes several times a week  HYPERLIPIDEMIA Disease Monitoring: See symptoms for Hypertension Medications: Compliance- taking crestor Right upper quadrant pain- no  Muscle aches- no  Patient has chronic issues with numbness in her right lateral leg.  Notes it is a little more bothersome than usual particularly when she standing up.  Only goes down to her knee over her right lateral thigh.  No weakness.  No significant back pain.  No incontinence.  She reports a small nodule over the ring finger flexor tendon in her mid palm.  This has been there since 2014.  Notes that she grabs something wrong it feels like a needle is poking her.  Notes it has not gotten bigger.  It is just there.   Social History   Tobacco Use  Smoking Status Current Every Day Smoker  . Packs/day: 0.50  . Types: Cigarettes  Smokeless Tobacco Never Used     ROS see history of present illness  Objective  Physical Exam Vitals:   10/19/17 0943  BP: 120/68  Pulse: 79  Resp: 15  Temp: 98.3 F (36.8 C)  SpO2: 97%    BP Readings from Last 3 Encounters:  10/19/17 120/68  02/16/17 110/60  11/08/16 100/62   Wt Readings from Last 3 Encounters:  10/19/17 172 lb 9.6 oz (78.3 kg)  02/16/17 170 lb 2 oz (77.2 kg)  11/08/16 168 lb 6.4 oz (76.4 kg)    Physical Exam  Constitutional: No distress.  Cardiovascular: Normal rate, regular rhythm and normal  heart sounds.  Pulmonary/Chest: Effort normal and breath sounds normal.  Musculoskeletal: She exhibits no edema.  Neurological: She is alert.  Slight decreased light touch sensation right lateral thigh down to her knee, otherwise intact light touch sensation bilateral lower extremities, 5/5 strength bilateral quads, hamstrings, plantar flexion, and dorsiflexion  Skin: Skin is warm and dry. She is not diaphoretic.  Small subcutaneous nodule with overlying callus at the midportion of her palm overlying the midportion of her ring finger flexor tendon, this does not appear to be attached to the tendon appears to be within the subcutaneous tissue, there is no tenderness, there is no overlying erythema, no midline spine tenderness, no midline spine step-off, no muscular back tenderness, benign-appearing nevus over anterior right shin   Diabetic Foot Exam - Simple   Simple Foot Form Diabetic Foot exam was performed with the following findings:  Yes 10/19/2017 10:33 AM  Visual Inspection No deformities, no ulcerations, no other skin breakdown bilaterally:  Yes Sensation Testing Intact to touch and monofilament testing bilaterally:  Yes Pulse Check Posterior Tibialis and Dorsalis pulse intact bilaterally:  Yes Comments      Assessment/Plan: Please see individual problem list.  Benign essential HTN Well-controlled.  Continue current regimen.  Hypothyroidism Check TSH.  Controlled type 2 diabetes mellitus without complication (HCC) Check A1c.  She will discontinue the omeprazole given her lows.  Continue Jardiance and metformin.  Request ophthalmology records.  Foot exam completed.  Hyperlipidemia  Check lipid panel.  Continue Crestor.  Numbness and tingling of right distal lateral thigh Continues to have issues with this.  Will start with lumbar spine imaging with an x-ray.  Consider physical therapy.  Consider neurology evaluation given chronicity.  Skin nodule On her left hand.  This is  been chronically there.  She wonders if she had something get in the area just has not come out.  I did discuss obtaining an x-ray to evaluate for foreign body or referral to sports medicine or a hand surgeon though she deferred both of these.  She will monitor and if it does not improve or if it worsens let us know.   Health Maintenance: Patient will contact gynecology to set up exam for possible Pap smear, mammogram scheduled for patient  Orders Placed This Encounter  Procedures  . MM 3D SCREEN BREAST BILATERAL    Standing Status:   Future    Standing Expiration Date:   12/20/2018    Order Specific Question:   Reason for Exam (SYMPTOM  OR DIAGNOSIS REQUIRED)    Answer:   breast cancer screening    Order Specific Question:   Is the patient pregnant?    Answer:   No    Order Specific Question:   Preferred imaging location?    Answer:   [facility]  . DG Lumbar Spine Complete    Standing Status:   Future    Number of Occurrences:   1    Standing Expiration Date:   12/20/2018    Order Specific Question:   Reason for Exam (SYMPTOM  OR DIAGNOSIS REQUIRED)    Answer:   radiculopathy to right lateral thigh    Order Specific Question:   Is patient pregnant?    Answer:   No    Order Specific Question:   Preferred imaging location?    Answer:   [facility] Specific Question:   Radiology Contrast Protocol - do NOT remove file path    Answer:   [email or web address]  . HgB A1c  . Lipid panel  . Comp Met (CMET)  . TSH    No orders of the defined types were placed in this encounter.    Tommi Rumps, MD [place]

## 2017-10-23 ENCOUNTER — Other Ambulatory Visit: Payer: Self-pay | Admitting: Family Medicine

## 2017-10-23 DIAGNOSIS — M5416 Radiculopathy, lumbar region: Secondary | ICD-10-CM

## 2017-10-23 MED ORDER — EMPAGLIFLOZIN 25 MG PO TABS: 25.0000 mg | ORAL_TABLET | Freq: Every day | ORAL | 1 refills | Status: DC

## 2017-10-25 ENCOUNTER — Telehealth: Payer: Self-pay | Admitting: Family Medicine

## 2017-10-25 NOTE — Telephone Encounter (Signed)
MRI order has been faxed to [facility] to the number listed [phone]

## 2017-10-25 NOTE — Telephone Encounter (Signed)
Copied from [place] [phone]. Topic: Quick Communication - See Telephone Encounter >> Oct 25, 2017 12:08 PM Ether Griffins B wrote: CRM for notification. See Telephone encounter for: 10/25/17.  Larene Beach with [facility] calling requesting an MRI order for Lumbar be faxed over to [phone]. Ins has already scheduled the pt to have the MRI done.

## 2017-11-02 ENCOUNTER — Ambulatory Visit: Payer: BLUE CROSS/BLUE SHIELD

## 2017-11-06 ENCOUNTER — Ambulatory Visit
Admission: RE | Admit: 2017-11-06 | Discharge: 2017-11-06 | Disposition: A | Discharge status: Home / Self Care | Payer: BLUE CROSS/BLUE SHIELD | Source: Ambulatory Visit | Attending: Family Medicine | Admitting: Family Medicine

## 2017-11-06 DIAGNOSIS — Z1231 Encounter for screening mammogram for malignant neoplasm of breast: Secondary | ICD-10-CM | POA: Insufficient documentation

## 2017-11-06 DIAGNOSIS — Z1239 Encounter for other screening for malignant neoplasm of breast: Secondary | ICD-10-CM

## 2017-11-18 ENCOUNTER — Encounter: Payer: Self-pay | Admitting: Family Medicine

## 2017-11-20 MED ORDER — GLUCOSE BLOOD VI STRP: ORAL_STRIP | 11 refills | Status: DC

## 2017-11-20 MED ORDER — GLIMEPIRIDE 1 MG PO TABS: 1.0000 mg | ORAL_TABLET | Freq: Every day | ORAL | 1 refills | Status: DC

## 2017-11-21 ENCOUNTER — Telehealth: Payer: Self-pay | Admitting: Family Medicine

## 2017-11-21 MED ORDER — GLUCOSE BLOOD VI STRP: ORAL_STRIP | 12 refills | Status: DC

## 2017-11-21 MED ORDER — CONTOUR BLOOD GLUCOSE SYSTEM W/DEVICE KIT: PACK | 0 refills | Status: DC

## 2017-11-21 NOTE — Telephone Encounter (Signed)
Copied from [place] [phone]. Topic: Quick Communication - See Telephone Encounter >> Nov 21, 2017  2:05 PM Ivar Drape wrote: CRM for notification. See Telephone encounter for: 11/21/17. Patient is in need of a refill on her test strips but her insurance company will not pay for One Touch anymore.  So she will need a new prescription for Contour test strips, and she will need a Contour meter as well.

## 2017-11-21 NOTE — Telephone Encounter (Signed)
Please send in new meter with supplies

## 2017-11-21 NOTE — Telephone Encounter (Signed)
Sent to pharmacy 

## 2017-12-13 ENCOUNTER — Encounter: Payer: Self-pay | Admitting: Family Medicine

## 2017-12-29 ENCOUNTER — Other Ambulatory Visit: Payer: Self-pay | Admitting: Family Medicine

## 2018-01-19 ENCOUNTER — Other Ambulatory Visit: Payer: Self-pay | Admitting: Family Medicine

## 2018-01-23 ENCOUNTER — Ambulatory Visit (INDEPENDENT_AMBULATORY_CARE_PROVIDER_SITE_OTHER): Payer: BLUE CROSS/BLUE SHIELD | Admitting: Family Medicine

## 2018-01-23 ENCOUNTER — Encounter: Payer: Self-pay | Admitting: Family Medicine

## 2018-01-23 VITALS — BP 110/70 | HR 80 | Temp 98.4°F | Resp 17 | Ht 59.0 in | Wt 171.4 lb

## 2018-01-23 DIAGNOSIS — G4733 Obstructive sleep apnea (adult) (pediatric): Secondary | ICD-10-CM | POA: Diagnosis not present

## 2018-01-23 DIAGNOSIS — E119 Type 2 diabetes mellitus without complications: Secondary | ICD-10-CM

## 2018-01-23 DIAGNOSIS — I10 Essential (primary) hypertension: Secondary | ICD-10-CM | POA: Diagnosis not present

## 2018-01-23 DIAGNOSIS — Z23 Encounter for immunization: Secondary | ICD-10-CM

## 2018-01-23 DIAGNOSIS — E785 Hyperlipidemia, unspecified: Secondary | ICD-10-CM | POA: Diagnosis not present

## 2018-01-23 DIAGNOSIS — Z124 Encounter for screening for malignant neoplasm of cervix: Secondary | ICD-10-CM

## 2018-01-23 LAB — POCT GLYCOSYLATED HEMOGLOBIN (HGB A1C): Hemoglobin A1C: 7.1 % — AB (ref 4.0–5.6)

## 2018-01-23 NOTE — Assessment & Plan Note (Signed)
Well-controlled.  Continue current regimen. 

## 2018-01-23 NOTE — Patient Instructions (Signed)
Nice to see you. We will contact you with your A1c. Please let us know if you have any issues getting her CPAP supplies.

## 2018-01-23 NOTE — Assessment & Plan Note (Signed)
Symptoms seem to be adequately controlled.  She will continue CPAP.  Prescription for CPAP supplies given.

## 2018-01-23 NOTE — Assessment & Plan Note (Signed)
Check A1c.  Continue current regimen. 

## 2018-01-23 NOTE — Progress Notes (Signed)
  Tommi Rumps, MD Phone: [phone]  Brenda Farmer is a 52 y.o. female who presents today for f/u.  CC: dm, htn, hld, osa  HYPERTENSION Disease Monitoring: Blood pressure range-not checking Chest pain- no      Dyspnea- no Medications: Compliance- taking HCTZ, lisinopril   Edema- no  DIABETES Disease Monitoring: Blood Sugar ranges-100-130 Polyuria/phagia/dipsia- no      Optho- due in November Medications: Compliance- taking glimeperide, jardiance, metformin Hypoglycemic symptoms- no  HYPERLIPIDEMIA Disease Monitoring: See symptoms for Hypertension Medications: Compliance- taking crestor Right upper quadrant pain- no  Muscle aches- no  OSA: Needs a prescription for supplies that she gets from [email or web address].  Uses her CPAP nightly 8 to 10 hours.  Does wake up well rested.  No hypersomnia.     Social History   Tobacco Use  Smoking Status Current Every Day Smoker  . Packs/day: 0.50  . Types: Cigarettes  Smokeless Tobacco Never Used     ROS see history of present illness  Objective  Physical Exam Vitals:   01/23/18 0904  BP: 110/70  Pulse: 80  Resp: 17  Temp: 98.4 F (36.9 C)  SpO2: 95%    BP Readings from Last 3 Encounters:  01/23/18 110/70  10/19/17 120/68  02/16/17 110/60   Wt Readings from Last 3 Encounters:  01/23/18 171 lb 6 oz (77.7 kg)  10/19/17 172 lb 9.6 oz (78.3 kg)  02/16/17 170 lb 2 oz (77.2 kg)    Physical Exam  Constitutional: No distress.  Cardiovascular: Normal rate, regular rhythm and normal heart sounds.  Pulmonary/Chest: Effort normal and breath sounds normal.  Musculoskeletal: She exhibits no edema.  Neurological: She is alert.  Skin: Skin is warm and dry. She is not diaphoretic.     Assessment/Plan: Please see individual problem list.  Benign essential HTN Well-controlled.  Continue current regimen.  OSA (obstructive sleep apnea) Symptoms seem to be adequately controlled.  She will continue CPAP.  Prescription  for CPAP supplies given.  Controlled type 2 diabetes mellitus without complication (HCC) Check A1c.  Continue current regimen.  Hyperlipidemia Continue Crestor.   Health Maintenance: Refer to GYN for Pap smear.  Flu shot given today.  Orders Placed This Encounter  Procedures  . Flu Vaccine QUAD 36+ mos IM  . Ambulatory referral to Gynecology    Referral Priority:   Routine    Referral Type:   Consultation    Referral Reason:   Specialty Services Required    Requested Specialty:   Gynecology    Number of Visits Requested:   1  . POCT HgB A1C    No orders of the defined types were placed in this encounter.    Tommi Rumps, MD [place]

## 2018-01-23 NOTE — Assessment & Plan Note (Signed)
Continue Crestor 

## 2018-02-18 ENCOUNTER — Encounter: Payer: Self-pay | Admitting: Obstetrics and Gynecology

## 2018-02-18 ENCOUNTER — Other Ambulatory Visit (HOSPITAL_COMMUNITY)
Admission: RE | Admit: 2018-02-18 | Discharge: 2018-02-18 | Disposition: A | Discharge status: Home / Self Care | Payer: BLUE CROSS/BLUE SHIELD | Source: Ambulatory Visit | Attending: Obstetrics and Gynecology | Admitting: Obstetrics and Gynecology

## 2018-02-18 ENCOUNTER — Ambulatory Visit (INDEPENDENT_AMBULATORY_CARE_PROVIDER_SITE_OTHER): Payer: BLUE CROSS/BLUE SHIELD | Admitting: Obstetrics and Gynecology

## 2018-02-18 VITALS — BP 120/60 | HR 93 | Ht 58.5 in | Wt 172.0 lb

## 2018-02-18 DIAGNOSIS — Z124 Encounter for screening for malignant neoplasm of cervix: Secondary | ICD-10-CM

## 2018-02-18 DIAGNOSIS — Z1151 Encounter for screening for human papillomavirus (HPV): Secondary | ICD-10-CM

## 2018-02-18 DIAGNOSIS — Z0000 Encounter for general adult medical examination without abnormal findings: Secondary | ICD-10-CM | POA: Diagnosis not present

## 2018-02-18 NOTE — Progress Notes (Signed)
PCP: Leone Haven, MD   Chief Complaint  Patient presents with  . Referral    [facility] referring for cervical screening, had mammogram done about 3 months ago,     HPI:      [name] is a 52 y.o. No obstetric history on file. who LMP was No LMP recorded. Patient has had a hysterectomy., presents today for her NP>3 yrs GYN examination, referred by PCP.  Her menses are absent due to supracx hyst for menorrhagia/cysts.  Dysmenorrhea none. She does not have intermenstrual bleeding. She does have occas vasomotor sx.   Sex activity: not sexually active. She does not have vaginal dryness.  Last Pap: July 08, 2014  Results were: no abnormalities /neg HPV DNA.   Last mammogram: November 07, 2017  Results were: normal--routine follow-up in 12 months There is no FH of breast cancer. There is no FH of ovarian cancer. The patient does do self-breast exams.  Colonoscopy: current with PCP  Tobacco use: 1/2 ppd Alcohol use: none Exercise: moderately active  She does not get adequate calcium and Vitamin D in her diet.  Labs with PCP.   Past Medical History:  Diagnosis Date  . Arthritis   . Chicken pox   . Diabetes mellitus without complication ([place])   . Diabetic eye exam ([place])   . History of mammogram 08/16/2012   neg  . History of Papanicolaou smear of cervix 06/21/2012   neg/neg  . Hyperlipidemia   . Hypertension   . Hypothyroidism (acquired)   . Obesity   . Sleep apnea   . Thyroid disease     Past Surgical History:  Procedure Laterality Date  . ABDOMINAL HYSTERECTOMY  2012   Cervix & ovaries intact  . APPENDECTOMY    . CESAREAN SECTION    . COLONOSCOPY WITH PROPOFOL N/A 11/05/2015   Procedure: COLONOSCOPY WITH PROPOFOL;  Surgeon: Lollie Sails, MD;  Location: [facility] ENDOSCOPY;  Service: Endoscopy;  Laterality: N/A;    Family History  Problem Relation Age of Onset  . Arthritis Mother   . Hyperlipidemia Mother   . Cervical cancer Mother 4   Malignant  . Alcohol abuse Father   . Hyperlipidemia Father   . Heart disease Father   . Hypertension Father   . Stroke Maternal Grandmother   . Diabetes Maternal Grandmother   . Arthritis Maternal Grandfather   . Diabetes Maternal Grandfather        DM Type 2  . Asthma Paternal Grandmother   . Heart disease Paternal Grandmother   . Lung cancer Paternal Grandmother        Malignant  . Prostate cancer Paternal Grandfather   . Hyperlipidemia Paternal Grandfather   . Hypertension Paternal Grandfather   . Heart disease Paternal Grandfather   . Lung cancer Paternal Grandfather   . Breast cancer Neg Hx     Social History   Socioeconomic History  . Marital status: Married    Spouse name: Not on file  . Number of children: Not on file  . Years of education: Not on file  . Highest education level: Not on file  Occupational History  . Not on file  Social Needs  . Financial resource strain: Not on file  . Food insecurity:    Worry: Not on file    Inability: Not on file  . Transportation needs:    Medical: Not on file    Non-medical: Not on file  Tobacco Use  . Smoking status:  Current Every Day Smoker    Packs/day: 0.50    Types: Cigarettes  . Smokeless tobacco: Never Used  Substance and Sexual Activity  . Alcohol use: Yes    Alcohol/week: 0.0 - 1.0 standard drinks    Comment: Rare usage   . Drug use: No  . Sexual activity: Yes    Partners: Male    Birth control/protection: Surgical    Comment: Husband   Lifestyle  . Physical activity:    Days per week: Not on file    Minutes per session: Not on file  . Stress: Not on file  Relationships  . Social connections:    Talks on phone: Not on file    Gets together: Not on file    Attends religious service: Not on file    Active member of club or organization: Not on file    Attends meetings of clubs or organizations: Not on file    Relationship status: Not on file  . Intimate partner violence:    Fear of current or ex  partner: Not on file    Emotionally abused: Not on file    Physically abused: Not on file    Forced sexual activity: Not on file  Other Topics Concern  . Not on file  Social History Narrative   Married    Children- 2    College Studying [employer]    [employer] almost 1 year left    Caffeine- 2 cups in am coffee and 1 cup later, no soda     Outpatient Medications Prior to Visit  Medication Sig Dispense Refill  . Blood Glucose Monitoring Suppl (CONTOUR BLOOD GLUCOSE SYSTEM) w/Device KIT Used to check blood glucose 3 times daily, diagnosis code E11.9 1 each 0  . diclofenac (VOLTAREN) 75 MG EC tablet TAKE 1 TABLET BY MOUTH TWICE DAILY AS NEEDED FOR  MILD  PAIN 180 tablet 2  . empagliflozin (JARDIANCE) 25 MG TABS tablet Take 25 mg by mouth daily. 90 tablet 1  . glimepiride (AMARYL) 1 MG tablet Take 1 tablet (1 mg total) by mouth daily with breakfast. (Patient taking differently: Take 2 mg by mouth daily with breakfast. ) 90 tablet 1  . glucose blood (CONTOUR TEST) test strip Use as instructed 100 each 12  . hydrochlorothiazide (HYDRODIURIL) 25 MG tablet TAKE 1 TABLET BY MOUTH ONCE DAILY 30 tablet 11  . Insulin Pen Needle (BD PEN NEEDLE NANO U/F) 32G X 4 MM MISC USE ONCE DAILY WITH VICTOZA 100 each 1  . levothyroxine (SYNTHROID, LEVOTHROID) 125 MCG tablet TAKE ONE TABLET BY MOUTH ONCE DAILY BEFORE BREAKFAST 90 tablet 3  . lisinopril (PRINIVIL,ZESTRIL) 20 MG tablet TAKE ONE TABLET BY MOUTH ONCE DAILY 90 tablet 3  . metFORMIN (GLUCOPHAGE-XR) 500 MG 24 hr tablet TAKE 3 TABLETS BY MOUTH ONCE DAILY FOR 7 DAYS THEN  INCREASE  TO  FOUR  TABLETS  ONCE  DAILY 120 tablet 11  . PROAIR HFA 108 (90 Base) MCG/ACT inhaler INHALE 1 PUFF BY MOUTH EVERY 4 HOURS AS NEEDED FOR WHEEZING OR  SHORTNESS  OF  BREATH 9 each 5  . rosuvastatin (CRESTOR) 20 MG tablet Take 1 tablet (20 mg total) by mouth daily. 90 tablet 3  . hydrochlorothiazide (HYDRODIURIL) 25 MG tablet Take 1 tablet (25 mg total) by mouth daily. 90  tablet 0  . metFORMIN (GLUCOPHAGE-XR) 500 MG 24 hr tablet TAKE THREE TABLETS BY MOUTH ONCE DAILY FOR 7 DAYS, THEN INCREASE TO FOUR TABLETS ONCE DAILY 360 tablet  1   No facility-administered medications prior to visit.     ROS:  Review of Systems  Constitutional: Negative for fatigue, fever and unexpected weight change.  Respiratory: Negative for cough, shortness of breath and wheezing.   Cardiovascular: Negative for chest pain, palpitations and leg swelling.  Gastrointestinal: Negative for blood in stool, constipation, diarrhea, nausea and vomiting.  Endocrine: Negative for cold intolerance, heat intolerance and polyuria.  Genitourinary: Negative for dyspareunia, dysuria, flank pain, frequency, genital sores, hematuria, menstrual problem, pelvic pain, urgency, vaginal bleeding, vaginal discharge and vaginal pain.  Musculoskeletal: Negative for back pain, joint swelling and myalgias.  Skin: Negative for rash.  Neurological: Negative for dizziness, syncope, light-headedness, numbness and headaches.  Hematological: Negative for adenopathy.  Psychiatric/Behavioral: Negative for agitation, confusion, sleep disturbance and suicidal ideas. The patient is not nervous/anxious.    BREAST: No symptoms    Objective: BP 120/60   Pulse 93   Ht 4' 10.5" (1.486 m)   Wt 172 lb (78 kg)   BMI 35.34 kg/m    Physical Exam  Constitutional: She is oriented to person, place, and time. She appears well-developed.  Genitourinary: There is no rash, tenderness, lesion or Bartholin's cyst on the right labia. There is no rash, tenderness, lesion or Bartholin's cyst on the left labia. No erythema, tenderness or bleeding in the vagina. No vaginal discharge found. Right adnexum does not display mass and does not display tenderness. Left adnexum does not display mass and does not display tenderness. Cervix does not exhibit motion tenderness, discharge or friability.  Genitourinary Comments: UTERUS SURG ABSENT    Abdominal: Soft. She exhibits no distension. There is no tenderness. There is no guarding.  Neurological: She is alert and oriented to person, place, and time. No cranial nerve deficit.  Skin: Skin is warm and dry.  Psychiatric: She has a normal mood and affect. Her behavior is normal. Thought content normal.  Nursing note and vitals reviewed.   Assessment/Plan:  Cervical cancer screening - Plan: Cytology - PAP  Screening for HPV (human papillomavirus) - Plan: Cytology - PAP  Normal breast exam - Pt current on mammo         GYN counsel breast self exam, mammography screening, adequate intake of calcium and vitamin D    F/U  Return in about 1 year (around 02/19/2019).  Tanaya Dunigan B. Malak Duchesneau, PA-C 02/18/2018 3:24 PM

## 2018-02-18 NOTE — Patient Instructions (Signed)
I value your feedback and entrusting us with your care. If you get a [facility] patient survey, I would appreciate you taking the time to let us know about your experience today. Thank you! 

## 2018-02-20 ENCOUNTER — Encounter: Payer: Self-pay | Admitting: Obstetrics and Gynecology

## 2018-02-20 LAB — CYTOLOGY - PAP
DIAGNOSIS: NEGATIVE
HPV: NOT DETECTED

## 2018-03-01 ENCOUNTER — Other Ambulatory Visit: Payer: Self-pay | Admitting: Family Medicine

## 2018-03-05 ENCOUNTER — Other Ambulatory Visit: Payer: Self-pay | Admitting: Family Medicine

## 2018-03-06 NOTE — Telephone Encounter (Signed)
Last OV 01/23/2018   Last refilled 06/15/2017 disp 180 with 2 refills   Sent to PCP for approval

## 2018-04-22 ENCOUNTER — Other Ambulatory Visit: Payer: Self-pay | Admitting: Family Medicine

## 2018-05-29 ENCOUNTER — Ambulatory Visit: Payer: Managed Care, Other (non HMO) | Admitting: Family Medicine

## 2018-05-29 ENCOUNTER — Encounter: Payer: Self-pay | Admitting: Family Medicine

## 2018-05-29 VITALS — BP 104/66 | HR 90 | Temp 98.5°F | Ht 58.5 in | Wt 175.4 lb

## 2018-05-29 DIAGNOSIS — I10 Essential (primary) hypertension: Secondary | ICD-10-CM

## 2018-05-29 DIAGNOSIS — E119 Type 2 diabetes mellitus without complications: Secondary | ICD-10-CM | POA: Diagnosis not present

## 2018-05-29 DIAGNOSIS — E039 Hypothyroidism, unspecified: Secondary | ICD-10-CM

## 2018-05-29 NOTE — Assessment & Plan Note (Signed)
At goal.  Continue current medication.  Check CMP.

## 2018-05-29 NOTE — Assessment & Plan Note (Signed)
Well-controlled. Check A1c. Continue current medication. 

## 2018-05-29 NOTE — Patient Instructions (Signed)
Nice to see you. We will get labs today and contact you with the results.  

## 2018-05-29 NOTE — Assessment & Plan Note (Signed)
Continue Synthroid °

## 2018-05-29 NOTE — Progress Notes (Signed)
  Tommi Rumps, MD Phone: [phone]  Brenda Farmer is a 53 y.o. female who presents today for f/u.  CC: htn, dm, hypothyroidism  HYPERTENSION  Disease Monitoring  Home BP Monitoring not checking Chest pain- no    Dyspnea- no Medications  Compliance-  Taking lisinopril, HCTZ.  Edema- no  DIABETES Disease Monitoring: Blood Sugar ranges-130s fasting Polyuria/phagia/dipsia- no      Optho- due, she will call to schedule an appointment Medications: Compliance- taking metformin, jardiance, glimeperide Hypoglycemic symptoms- no  HYPOTHYROIDISM Disease Monitoring Weight changes: gain with the holidays  Skin Changes: no Heat/Cold intolerance: no  Medication Monitoring Compliance:  Taking synthroid   Last TSH:   Lab Results  Component Value Date   TSH 3.47 10/19/2017        Social History   Tobacco Use  Smoking Status Current Every Day Smoker  . Packs/day: 0.50  . Types: Cigarettes  Smokeless Tobacco Never Used     ROS see history of present illness  Objective  Physical Exam Vitals:   05/29/18 1555  BP: 104/66  Pulse: 90  Temp: 98.5 F (36.9 C)  SpO2: 95%    BP Readings from Last 3 Encounters:  05/29/18 104/66  02/18/18 120/60  01/23/18 110/70   Wt Readings from Last 3 Encounters:  05/29/18 175 lb 6.4 oz (79.6 kg)  02/18/18 172 lb (78 kg)  01/23/18 171 lb 6 oz (77.7 kg)    Physical Exam Constitutional:      General: She is not in acute distress.    Appearance: She is not diaphoretic.  Cardiovascular:     Rate and Rhythm: Normal rate and regular rhythm.     Heart sounds: Normal heart sounds.  Pulmonary:     Effort: Pulmonary effort is normal.     Breath sounds: Normal breath sounds.  Skin:    General: Skin is warm and dry.  Neurological:     Mental Status: She is alert.      Assessment/Plan: Please see individual problem list.  Benign essential HTN At goal.  Continue current medication.  Check CMP.  Controlled type 2  diabetes mellitus without complication ([place]) Well-controlled.  Check A1c.  Continue current medication.  Hypothyroidism Continue Synthroid.    Orders Placed This Encounter  Procedures  . HgB A1c  . Comp Met (CMET)    No orders of the defined types were placed in this encounter.    Tommi Rumps, MD [place]

## 2018-05-30 ENCOUNTER — Other Ambulatory Visit: Payer: Self-pay | Admitting: Family Medicine

## 2018-05-30 DIAGNOSIS — R7989 Other specified abnormal findings of blood chemistry: Secondary | ICD-10-CM

## 2018-05-30 DIAGNOSIS — R945 Abnormal results of liver function studies: Principal | ICD-10-CM

## 2018-05-30 LAB — COMPREHENSIVE METABOLIC PANEL
ALK PHOS: 62 U/L (ref 39–117)
ALT: 43 U/L — ABNORMAL HIGH (ref 0–35)
AST: 23 U/L (ref 0–37)
Albumin: 4.7 g/dL (ref 3.5–5.2)
BUN: 20 mg/dL (ref 6–23)
CALCIUM: 10.6 mg/dL — AB (ref 8.4–10.5)
CO2: 29 mEq/L (ref 19–32)
Chloride: 101 mEq/L (ref 96–112)
Creatinine, Ser: 0.76 mg/dL (ref 0.40–1.20)
GFR: 84.47 mL/min (ref >60.00)
Glucose, Bld: 116 mg/dL — ABNORMAL HIGH (ref 70–99)
Potassium: 4.3 mEq/L (ref 3.5–5.1)
Sodium: 140 mEq/L (ref 135–145)
Total Bilirubin: 0.3 mg/dL (ref 0.2–1.2)
Total Protein: 7.1 g/dL (ref 6.0–8.3)

## 2018-05-30 LAB — HEMOGLOBIN A1C: Hgb A1c MFr Bld: 8.4 % — ABNORMAL HIGH (ref 4.6–6.5)

## 2018-06-05 ENCOUNTER — Other Ambulatory Visit: Payer: Self-pay | Admitting: Family Medicine

## 2018-06-06 ENCOUNTER — Other Ambulatory Visit: Payer: Self-pay | Admitting: Family Medicine

## 2018-06-06 DIAGNOSIS — R945 Abnormal results of liver function studies: Principal | ICD-10-CM

## 2018-06-06 DIAGNOSIS — R7989 Other specified abnormal findings of blood chemistry: Secondary | ICD-10-CM

## 2018-06-10 ENCOUNTER — Encounter: Payer: Self-pay | Admitting: Family Medicine

## 2018-06-10 ENCOUNTER — Other Ambulatory Visit (INDEPENDENT_AMBULATORY_CARE_PROVIDER_SITE_OTHER): Payer: Managed Care, Other (non HMO)

## 2018-06-10 DIAGNOSIS — R945 Abnormal results of liver function studies: Secondary | ICD-10-CM | POA: Diagnosis not present

## 2018-06-10 DIAGNOSIS — R7989 Other specified abnormal findings of blood chemistry: Secondary | ICD-10-CM

## 2018-06-10 LAB — COMPREHENSIVE METABOLIC PANEL
ALT: 38 U/L — ABNORMAL HIGH (ref 0–35)
AST: 22 U/L (ref 0–37)
Albumin: 4.8 g/dL (ref 3.5–5.2)
Alkaline Phosphatase: 75 U/L (ref 39–117)
BUN: 22 mg/dL (ref 6–23)
CO2: 29 mEq/L (ref 19–32)
Calcium: 10.6 mg/dL — ABNORMAL HIGH (ref 8.4–10.5)
Chloride: 100 mEq/L (ref 96–112)
Creatinine, Ser: 0.86 mg/dL (ref 0.40–1.20)
GFR: 68.9 mL/min (ref >60.00)
Glucose, Bld: 163 mg/dL — ABNORMAL HIGH (ref 70–99)
POTASSIUM: 4.4 meq/L (ref 3.5–5.1)
Sodium: 139 mEq/L (ref 135–145)
Total Bilirubin: 0.3 mg/dL (ref 0.2–1.2)
Total Protein: 7.3 g/dL (ref 6.0–8.3)

## 2018-06-11 ENCOUNTER — Other Ambulatory Visit: Payer: Self-pay | Admitting: Family Medicine

## 2018-06-20 ENCOUNTER — Other Ambulatory Visit: Payer: Managed Care, Other (non HMO)

## 2018-06-20 ENCOUNTER — Other Ambulatory Visit (INDEPENDENT_AMBULATORY_CARE_PROVIDER_SITE_OTHER): Payer: Managed Care, Other (non HMO)

## 2018-06-21 LAB — PTH, INTACT AND CALCIUM
Calcium: 10.2 mg/dL (ref 8.6–10.4)
PTH: 25 pg/mL (ref 14–64)

## 2018-08-05 ENCOUNTER — Other Ambulatory Visit: Payer: Self-pay | Admitting: Family Medicine

## 2018-08-07 LAB — HM DIABETES EYE EXAM

## 2018-09-07 ENCOUNTER — Other Ambulatory Visit: Payer: Self-pay | Admitting: Family Medicine

## 2018-09-09 NOTE — Telephone Encounter (Signed)
Last OV 05/29/2018  Diclofenac Last refilled 06/06/2018 dsip 180 with no refills   Next OV 10/02/2018  Sent to PCP for approval

## 2018-09-10 NOTE — Telephone Encounter (Signed)
Last OV  05/29/2018  Diclofenac last refilled 06/06/2018 disp 180 with no refills   Next OV 10/02/2018  Sent to PCP for approval for voltaren

## 2018-09-10 NOTE — Telephone Encounter (Signed)
Please contact the patient and see how frequently she is taking the diclofenac and for what reason.  Please also find out how frequently she is using the albuterol inhaler.  Thanks.

## 2018-09-11 NOTE — Telephone Encounter (Signed)
Called and spoke with patient. Pt is taking the diclofenac BID daily for joint pain/ arthritis.   She is now using the [facility] daily due to allergy season otherwise will use as needed.

## 2018-09-12 MED ORDER — LEVOTHYROXINE SODIUM 125 MCG PO TABS: 125.0000 ug | ORAL_TABLET | Freq: Every day | ORAL | 3 refills | Status: DC

## 2018-09-27 ENCOUNTER — Ambulatory Visit: Payer: Managed Care, Other (non HMO) | Admitting: Family Medicine

## 2018-10-02 ENCOUNTER — Ambulatory Visit (INDEPENDENT_AMBULATORY_CARE_PROVIDER_SITE_OTHER): Payer: Managed Care, Other (non HMO) | Admitting: Family Medicine

## 2018-10-02 ENCOUNTER — Other Ambulatory Visit: Payer: Self-pay | Admitting: Family Medicine

## 2018-10-02 ENCOUNTER — Encounter: Payer: Self-pay | Admitting: Family Medicine

## 2018-10-02 ENCOUNTER — Other Ambulatory Visit: Payer: Self-pay

## 2018-10-02 ENCOUNTER — Telehealth: Payer: Self-pay | Admitting: Family Medicine

## 2018-10-02 VITALS — Wt 173.0 lb

## 2018-10-02 DIAGNOSIS — E039 Hypothyroidism, unspecified: Secondary | ICD-10-CM

## 2018-10-02 DIAGNOSIS — I10 Essential (primary) hypertension: Secondary | ICD-10-CM

## 2018-10-02 DIAGNOSIS — E785 Hyperlipidemia, unspecified: Secondary | ICD-10-CM

## 2018-10-02 DIAGNOSIS — E119 Type 2 diabetes mellitus without complications: Secondary | ICD-10-CM

## 2018-10-02 MED ORDER — DULAGLUTIDE 0.75 MG/0.5ML ~~LOC~~ SOAJ: 0.7500 mg | SUBCUTANEOUS | 2 refills | Status: DC

## 2018-10-02 NOTE — Telephone Encounter (Signed)
Please contact the patient and get her set up for lab work in 1 month.  Orders placed.  Please get her scheduled for follow-up in 3 to 4 months.  Thanks.

## 2018-10-02 NOTE — Assessment & Plan Note (Signed)
She will continue current medications.  She will come in for lab work in 1 month.

## 2018-10-02 NOTE — Assessment & Plan Note (Signed)
Continue Crestor.  Lab work in 1 month.

## 2018-10-02 NOTE — Assessment & Plan Note (Signed)
Discussed adding Trulicity.  She denies family history or personal history of thyroid cancer, parathyroid cancer, or adrenal cancer.  Discussed monitoring for changes of her thyroid with this medication.  She will see how expensive this is and if it is affordable we can provide her with a two-week sample to see if she can tolerate it.  She will continue her other medications.  Lab work in 1 month.

## 2018-10-02 NOTE — Progress Notes (Signed)
Virtual Visit via video Note  This visit type was conducted due to national recommendations for restrictions regarding the COVID-19 pandemic (e.g. social distancing).  This format is felt to be most appropriate for this patient at this time.  All issues noted in this document were discussed and addressed.  No physical exam was performed (except for noted visual exam findings with Video Visits).   I connected with Brenda Farmer today at 10:00 AM EDT by a video enabled telemedicine application and verified that I am speaking with the correct person using two identifiers. Location patient: home Location provider: work or home office Persons participating in the virtual visit: patient, provider  I discussed the limitations, risks, security and privacy concerns of performing an evaluation and management service by telephone and the availability of in person appointments. I also discussed with the patient that there may be a patient responsible charge related to this service. The patient expressed understanding and agreed to proceed.  Reason for visit: follow-up  HPI: HYPERTENSION Disease Monitoring: Blood pressure range-not checking Chest pain- no      Dyspnea- no Medications: Compliance- taking HCTZ, lisinopril   Edema- no  DIABETES Disease Monitoring: Blood Sugar ranges-163 fasting this morning, 270 2 hours postprandial today Polyuria/phagia/dipsia- no      Optho- utd Medications: Compliance- taking glimeperide, jardiance, metformin Hypoglycemic symptoms- rare when she does not eat enough on the weekends  HYPERLIPIDEMIA Disease Monitoring: See symptoms for Hypertension Medications: Compliance- taking crestor Right upper quadrant pain- no  Muscle aches- no   ROS: See pertinent positives and negatives per HPI.  Past Medical History:  Diagnosis Date  . Arthritis   . Chicken pox   . Diabetes mellitus without complication ([place])   . Diabetic eye exam ([place])   . History of  mammogram 08/16/2012   neg  . History of Papanicolaou smear of cervix 06/21/2012   neg/neg  . Hyperlipidemia   . Hypertension   . Hypothyroidism (acquired)   . Obesity   . Sleep apnea   . Thyroid disease     Past Surgical History:  Procedure Laterality Date  . ABDOMINAL HYSTERECTOMY  2012   Cervix & ovaries intact  . APPENDECTOMY    . CESAREAN SECTION    . COLONOSCOPY WITH PROPOFOL N/A 11/05/2015   Procedure: COLONOSCOPY WITH PROPOFOL;  Surgeon: Lollie Sails, MD;  Location: [facility] ENDOSCOPY;  Service: Endoscopy;  Laterality: N/A;    Family History  Problem Relation Age of Onset  . Arthritis Mother   . Hyperlipidemia Mother   . Cervical cancer Mother 20       Malignant  . Alcohol abuse Father   . Hyperlipidemia Father   . Heart disease Father   . Hypertension Father   . Stroke Maternal Grandmother   . Diabetes Maternal Grandmother   . Arthritis Maternal Grandfather   . Diabetes Maternal Grandfather        DM Type 2  . Asthma Paternal Grandmother   . Heart disease Paternal Grandmother   . Lung cancer Paternal Grandmother        Malignant  . Prostate cancer Paternal Grandfather   . Hyperlipidemia Paternal Grandfather   . Hypertension Paternal Grandfather   . Heart disease Paternal Grandfather   . Lung cancer Paternal Grandfather   . Breast cancer Neg Hx     SOCIAL HX: Smoker   Current Outpatient Medications:  .  Blood Glucose Monitoring Suppl (CONTOUR BLOOD GLUCOSE SYSTEM) w/Device KIT, Used to check blood glucose 3  times daily, diagnosis code E11.9, Disp: 1 each, Rfl: 0 .  diclofenac (VOLTAREN) 75 MG EC tablet, TAKE 1 TABLET BY MOUTH TWICE DAILY AS NEEDED FOR MILD PAIN, Disp: 180 tablet, Rfl: 0 .  glimepiride (AMARYL) 4 MG tablet, Take 1 tablet by mouth once daily with breakfast, Disp: 90 tablet, Rfl: 0 .  glucose blood (CONTOUR TEST) test strip, Use as instructed, Disp: 100 each, Rfl: 12 .  hydrochlorothiazide (HYDRODIURIL) 25 MG tablet, TAKE 1 TABLET BY  MOUTH ONCE DAILY, Disp: 30 tablet, Rfl: 11 .  JARDIANCE 25 MG TABS tablet, TAKE 1 TABLET BY MOUTH ONCE DAILY, Disp: 90 tablet, Rfl: 1 .  levothyroxine (SYNTHROID) 125 MCG tablet, Take 1 tablet (125 mcg total) by mouth daily., Disp: 90 tablet, Rfl: 3 .  lisinopril (ZESTRIL) 20 MG tablet, Take 1 tablet by mouth once daily, Disp: 90 tablet, Rfl: 0 .  metFORMIN (GLUCOPHAGE-XR) 500 MG 24 hr tablet, TAKE 3 TABLETS BY MOUTH ONCE DAILY FOR 7 DAYS THEN  INCREASE  TO  FOUR  TABLETS  ONCE  DAILY, Disp: 120 tablet, Rfl: 11 .  Multiple Vitamin (MULTIVITAMIN) tablet, Take 1 tablet by mouth daily., Disp: , Rfl:  .  PROAIR HFA 108 (90 Base) MCG/ACT inhaler, INHALE 1 PUFF BY MOUTH EVERY 4 HOURS AS NEEDED FOR WHEEZING OR SHORTNESS OF BREATH, Disp: 9 g, Rfl: 0 .  rosuvastatin (CRESTOR) 20 MG tablet, TAKE 1 TABLET BY MOUTH ONCE DAILY, Disp: 30 tablet, Rfl: 11 .  Dulaglutide (TRULICITY) 1.91 YN/8.2NF SOPN, Inject 0.75 mg into the skin once a week., Disp: 12 pen, Rfl: 2 .  glimepiride (AMARYL) 1 MG tablet, Take 1 tablet (1 mg total) by mouth daily with breakfast. (Patient not taking: Reported on 10/02/2018), Disp: 90 tablet, Rfl: 1  EXAM:  VITALS per patient if applicable: None  GENERAL: alert, oriented, appears well and in no acute distress  HEENT: atraumatic, conjunttiva clear, no obvious abnormalities on inspection of external nose and ears  NECK: normal movements of the head and neck  LUNGS: on inspection no signs of respiratory distress, breathing rate appears normal, no obvious gross SOB, gasping or wheezing  CV: no obvious cyanosis  MS: moves all visible extremities without noticeable abnormality  PSYCH/NEURO: pleasant and cooperative, no obvious depression or anxiety, speech and thought processing grossly intact  ASSESSMENT AND PLAN:  Discussed the following assessment and plan:  Benign essential HTN - Plan: Comp Met (CMET)  Controlled type 2 diabetes mellitus without complication, without  long-term current use of insulin (HCC) - Plan: Hemoglobin A1c  Hyperlipidemia, unspecified hyperlipidemia type - Plan: Lipid panel  Hypothyroidism, unspecified type - Plan: TSH  Benign essential HTN She will continue current medications.  She will come in for lab work in 1 month.  Controlled type 2 diabetes mellitus without complication ([place]) Discussed adding Trulicity.  She denies family history or personal history of thyroid cancer, parathyroid cancer, or adrenal cancer.  Discussed monitoring for changes of her thyroid with this medication.  She will see how expensive this is and if it is affordable we can provide her with a two-week sample to see if she can tolerate it.  She will continue her other medications.  Lab work in 1 month.  Hyperlipidemia Continue Crestor.  Lab work in 1 month.  CMA will contact the patient to schedule follow-up in 3 to 4 months.  Lab work in 1 month.  Social distancing precautions and sick precautions given regarding COVID-19.   I discussed the assessment and  treatment plan with the patient. The patient was provided an opportunity to ask questions and all were answered. The patient agreed with the plan and demonstrated an understanding of the instructions.   The patient was advised to call back or seek an in-person evaluation if the symptoms worsen or if the condition fails to improve as anticipated.   Tommi Rumps, MD

## 2018-10-21 ENCOUNTER — Encounter: Payer: Self-pay | Admitting: Family Medicine

## 2018-10-22 ENCOUNTER — Telehealth: Payer: Self-pay | Admitting: Family Medicine

## 2018-10-22 ENCOUNTER — Ambulatory Visit: Payer: Self-pay | Admitting: Family Medicine

## 2018-10-22 ENCOUNTER — Ambulatory Visit (INDEPENDENT_AMBULATORY_CARE_PROVIDER_SITE_OTHER): Payer: Managed Care, Other (non HMO) | Admitting: Family Medicine

## 2018-10-22 ENCOUNTER — Telehealth: Payer: Self-pay | Admitting: *Deleted

## 2018-10-22 ENCOUNTER — Other Ambulatory Visit: Payer: Self-pay

## 2018-10-22 DIAGNOSIS — Z20822 Contact with and (suspected) exposure to covid-19: Secondary | ICD-10-CM

## 2018-10-22 DIAGNOSIS — R059 Cough, unspecified: Secondary | ICD-10-CM

## 2018-10-22 DIAGNOSIS — R05 Cough: Secondary | ICD-10-CM

## 2018-10-22 DIAGNOSIS — Z20828 Contact with and (suspected) exposure to other viral communicable diseases: Secondary | ICD-10-CM

## 2018-10-22 DIAGNOSIS — R0989 Other specified symptoms and signs involving the circulatory and respiratory systems: Secondary | ICD-10-CM

## 2018-10-22 NOTE — Telephone Encounter (Signed)
Pt called and scheduled for testing on 10/23/18 at 8 am at the [facility] site. Pt advised to wear a mask and to remain in car at the time of appt. Understanding verbalized.

## 2018-10-22 NOTE — Telephone Encounter (Signed)
Pt. Reports she has been exposed to a positive COVID case at work. Does have a runny nose. Would like a virtual visit and see if she needs testing. Warm transfer to [facility] in the practice for a visit. Answer Assessment - Initial Assessment Questions 1. CLOSE CONTACT: "Who is the person with the confirmed or suspected COVID-19 infection that you were exposed to?"     Co-worker 2. PLACE of CONTACT: "Where were you when you were exposed to COVID-19?" (e.g., home, school, medical waiting room; which city?)     Work 3. TYPE of CONTACT: "How much contact was there?" (e.g., sitting next to, live in same house, work in same office, same building)     Office 4. DURATION of CONTACT: "How long were you in contact with the COVID-19 patient?" (e.g., a few seconds, passed by person, a few minutes, live with the patient)     Every day for 5-10 minutes 5. DATE of CONTACT: "When did you have contact with a COVID-19 patient?" (e.g., how many days ago)     Last week 6. TRAVEL: "Have you traveled out of the country recently?" If so, "When and where?"     * Also ask about out-of-state travel, since the CDC has identified some high risk cities for community spread in the [place].     * Note: Travel becomes less relevant if there is widespread community transmission where the patient lives.     No 7. COMMUNITY SPREAD: "Are there lots of cases of COVID-19 (community spread) where you live?" (See public health department website, if unsure)   * MAJOR community spread: high number of cases; numbers of cases are increasing; many people hospitalized.   * MINOR community spread: low number of cases; not increasing; few or no people hospitalized     No 8. SYMPTOMS: "Do you have any symptoms?" (e.g., fever, cough, breathing difficulty)     Runny nose 9. PREGNANCY OR POSTPARTUM: "Is there any chance you are pregnant?" "When was your last menstrual period?" "Did you deliver in the last 2 weeks?"     No 10. HIGH RISK: "Do you  have any heart or lung problems? Do you have a weak immune system?" (e.g., CHF, COPD, asthma, HIV positive, chemotherapy, renal failure, diabetes mellitus, sickle cell anemia)       Diabetic  Protocols used: CORONAVIRUS (COVID-19) EXPOSURE-A-AH

## 2018-10-22 NOTE — Telephone Encounter (Signed)
Seeing you today

## 2018-10-22 NOTE — Progress Notes (Signed)
Patient ID: Brenda Farmer, female   DOB: Jul 31, 1965, 53 y.o.   MRN: [id number]   Virtual Visit via video Note  This visit type was conducted due to national recommendations for restrictions regarding the COVID-19 pandemic (e.g. social distancing).  This format is felt to be most appropriate for this patient at this time.  All issues noted in this document were discussed and addressed.  No physical exam was performed (except for noted visual exam findings with Video Visits).   I connected with Brenda Farmer today at  3:20 PM EDT by a video enabled telemedicine application and verified that I am speaking with the correct person using two identifiers. Location patient: home Location provider: [facility] Persons participating in the virtual visit: patient, provider  I discussed the limitations, risks, security and privacy concerns of performing an evaluation and management service by video and the availability of in person appointments. I also discussed with the patient that there may be a patient responsible charge related to this service. The patient expressed understanding and agreed to proceed.  HPI:  Patient and I connected via video due to complaint of cough, runny nose and positive COVID-19 exposure from a coworker that she interacts with closely.  Patient states the cough is been present for 1 to 2 days and now that she know she has had a positive exposure was thinking she should get tested for COVID-19 as well.  Otherwise denies shortness breath or wheezing.  Denies chest pain.  Denies fever or chills.  Denies nausea, vomiting or diarrhea.   ROS: See pertinent positives and negatives per HPI.  Past Medical History:  Diagnosis Date  . Arthritis   . Chicken pox   . Diabetes mellitus without complication ([place])   . Diabetic eye exam ([place])   . History of mammogram 08/16/2012   neg  . History of Papanicolaou smear of cervix 06/21/2012   neg/neg  . Hyperlipidemia   .  Hypertension   . Hypothyroidism (acquired)   . Obesity   . Sleep apnea   . Thyroid disease     Past Surgical History:  Procedure Laterality Date  . ABDOMINAL HYSTERECTOMY  2012   Cervix & ovaries intact  . APPENDECTOMY    . CESAREAN SECTION    . COLONOSCOPY WITH PROPOFOL N/A 11/05/2015   Procedure: COLONOSCOPY WITH PROPOFOL;  Surgeon: Lollie Sails, MD;  Location: [facility] ENDOSCOPY;  Service: Endoscopy;  Laterality: N/A;    Family History  Problem Relation Age of Onset  . Arthritis Mother   . Hyperlipidemia Mother   . Cervical cancer Mother 13       Malignant  . Alcohol abuse Father   . Hyperlipidemia Father   . Heart disease Father   . Hypertension Father   . Stroke Maternal Grandmother   . Diabetes Maternal Grandmother   . Arthritis Maternal Grandfather   . Diabetes Maternal Grandfather        DM Type 2  . Asthma Paternal Grandmother   . Heart disease Paternal Grandmother   . Lung cancer Paternal Grandmother        Malignant  . Prostate cancer Paternal Grandfather   . Hyperlipidemia Paternal Grandfather   . Hypertension Paternal Grandfather   . Heart disease Paternal Grandfather   . Lung cancer Paternal Grandfather   . Breast cancer Neg Hx     Social History   Tobacco Use  . Smoking status: Current Every Day Smoker    Packs/day: 0.50    Types:  Cigarettes  . Smokeless tobacco: Never Used  Substance Use Topics  . Alcohol use: Yes    Alcohol/week: 0.0 - 1.0 standard drinks    Comment: Rare usage       Current Outpatient Medications:  .  Blood Glucose Monitoring Suppl (CONTOUR BLOOD GLUCOSE SYSTEM) w/Device KIT, Used to check blood glucose 3 times daily, diagnosis code E11.9, Disp: 1 each, Rfl: 0 .  diclofenac (VOLTAREN) 75 MG EC tablet, TAKE 1 TABLET BY MOUTH TWICE DAILY AS NEEDED FOR MILD PAIN, Disp: 180 tablet, Rfl: 0 .  Dulaglutide (TRULICITY) 2.83 MO/2.9UT SOPN, Inject 0.75 mg into the skin once a week., Disp: 12 pen, Rfl: 2 .  glimepiride (AMARYL) 1  MG tablet, Take 1 tablet (1 mg total) by mouth daily with breakfast., Disp: 90 tablet, Rfl: 1 .  glimepiride (AMARYL) 4 MG tablet, Take 1 tablet by mouth once daily with breakfast, Disp: 90 tablet, Rfl: 0 .  glucose blood (CONTOUR TEST) test strip, Use as instructed, Disp: 100 each, Rfl: 12 .  hydrochlorothiazide (HYDRODIURIL) 25 MG tablet, Take 1 tablet by mouth once daily, Disp: 90 tablet, Rfl: 0 .  JARDIANCE 25 MG TABS tablet, TAKE 1 TABLET BY MOUTH ONCE DAILY, Disp: 90 tablet, Rfl: 1 .  levothyroxine (SYNTHROID) 125 MCG tablet, Take 1 tablet (125 mcg total) by mouth daily., Disp: 90 tablet, Rfl: 3 .  lisinopril (ZESTRIL) 20 MG tablet, Take 1 tablet by mouth once daily, Disp: 90 tablet, Rfl: 0 .  metFORMIN (GLUCOPHAGE-XR) 500 MG 24 hr tablet, TAKE 3 TABLETS BY MOUTH ONCE DAILY FOR 7 DAYS THEN  INCREASE  TO  FOUR  TABLETS  ONCE  DAILY, Disp: 120 tablet, Rfl: 11 .  Multiple Vitamin (MULTIVITAMIN) tablet, Take 1 tablet by mouth daily., Disp: , Rfl:  .  PROAIR HFA 108 (90 Base) MCG/ACT inhaler, INHALE 1 PUFF BY MOUTH EVERY 4 HOURS AS NEEDED FOR WHEEZING OR SHORTNESS OF BREATH, Disp: 9 g, Rfl: 0 .  rosuvastatin (CRESTOR) 20 MG tablet, TAKE 1 TABLET BY MOUTH ONCE DAILY, Disp: 30 tablet, Rfl: 11  EXAM:  GENERAL: alert, oriented, appears well and in no acute distress  HEENT: atraumatic, conjunttiva clear, no obvious abnormalities on inspection of external nose and ears  NECK: normal movements of the head and neck  LUNGS: on inspection no signs of respiratory distress, breathing rate appears normal, no obvious gross SOB, gasping or wheezing  CV: no obvious cyanosis  MS: moves all visible extremities without noticeable abnormality  PSYCH/NEURO: pleasant and cooperative, no obvious depression or anxiety, speech and thought processing grossly intact  ASSESSMENT AND PLAN:  Discussed the following assessment and plan:  Close Exposure to Covid-19 Virus  Runny nose  Cough  Due to patient  having close positive exposure to someone with COVID-19 and some mild symptoms we will get her set up for COVID-19 testing.  After going to get the test patient has been advised she must self quarantine until we have the results back and until her symptoms are improved.  Advised she can use over-the-counter cough medication such as Robitussin or Mucinex, she can use Flonase nasal spray and/or oral antihistamine allergy medication to help deal with runny nose, Tylenol as needed, plenty of rest and increasing fluid intake.  Patient advised that if her symptoms worsen in any way, she should call office and update [place] on changes in her condition.   I discussed the assessment and treatment plan with the patient. The patient was provided an opportunity to  ask questions and all were answered. The patient agreed with the plan and demonstrated an understanding of the instructions.   The patient was advised to call back or seek an in-person evaluation if the symptoms worsen or if the condition fails to improve as anticipated.    Jodelle Green, FNP

## 2018-10-22 NOTE — Telephone Encounter (Signed)
Brenda Farmer  03-Apr-1966  [id number]  Needs test because +exposure to covid 19 person, runny nose  [employer], [id number]

## 2018-10-22 NOTE — Telephone Encounter (Signed)
Pt called and testing scheduled for 10/23/18 at the [facility] site.

## 2018-10-23 ENCOUNTER — Other Ambulatory Visit: Payer: Managed Care, Other (non HMO)

## 2018-10-23 ENCOUNTER — Other Ambulatory Visit: Payer: Self-pay | Admitting: Family Medicine

## 2018-10-23 ENCOUNTER — Other Ambulatory Visit: Payer: Self-pay

## 2018-10-23 DIAGNOSIS — Z20822 Contact with and (suspected) exposure to covid-19: Secondary | ICD-10-CM

## 2018-10-23 NOTE — Addendum Note (Signed)
Addended by: Denman George on: 10/23/2018 08:08 AM   Modules accepted: Orders

## 2018-10-28 LAB — SPECIMEN STATUS REPORT

## 2018-10-28 LAB — NOVEL CORONAVIRUS, NAA: SARS-CoV-2, NAA: NOT DETECTED

## 2018-10-28 NOTE — Progress Notes (Signed)
Pt stated she went to lab to have test done and they could not find order and she went back at 11:30am on 10/23/2018  To have it done at [facility]

## 2018-10-29 ENCOUNTER — Other Ambulatory Visit: Payer: Self-pay | Admitting: Family Medicine

## 2018-11-04 ENCOUNTER — Other Ambulatory Visit (INDEPENDENT_AMBULATORY_CARE_PROVIDER_SITE_OTHER): Payer: Managed Care, Other (non HMO)

## 2018-11-04 ENCOUNTER — Other Ambulatory Visit: Payer: Self-pay

## 2018-11-04 DIAGNOSIS — E039 Hypothyroidism, unspecified: Secondary | ICD-10-CM

## 2018-11-04 NOTE — Addendum Note (Signed)
Addended by: Elpidio Galea T on: 11/04/2018 10:05 AM   Modules accepted: Orders

## 2018-11-05 ENCOUNTER — Other Ambulatory Visit (INDEPENDENT_AMBULATORY_CARE_PROVIDER_SITE_OTHER): Payer: Managed Care, Other (non HMO)

## 2018-11-05 DIAGNOSIS — E785 Hyperlipidemia, unspecified: Secondary | ICD-10-CM | POA: Diagnosis not present

## 2018-11-05 DIAGNOSIS — I10 Essential (primary) hypertension: Secondary | ICD-10-CM

## 2018-11-05 DIAGNOSIS — E119 Type 2 diabetes mellitus without complications: Secondary | ICD-10-CM

## 2018-11-05 DIAGNOSIS — E039 Hypothyroidism, unspecified: Secondary | ICD-10-CM | POA: Diagnosis not present

## 2018-11-06 LAB — COMPREHENSIVE METABOLIC PANEL
ALT: 52 U/L — ABNORMAL HIGH (ref 0–35)
AST: 23 U/L (ref 0–37)
Albumin: 4.6 g/dL (ref 3.5–5.2)
Alkaline Phosphatase: 84 U/L (ref 39–117)
BUN: 20 mg/dL (ref 6–23)
CO2: 29 mEq/L (ref 19–32)
Calcium: 10.1 mg/dL (ref 8.4–10.5)
Chloride: 101 mEq/L (ref 96–112)
Creatinine, Ser: 0.75 mg/dL (ref 0.40–1.20)
GFR: 80.56 mL/min (ref >60.00)
Glucose, Bld: 272 mg/dL — ABNORMAL HIGH (ref 70–99)
Potassium: 4.7 mEq/L (ref 3.5–5.1)
Sodium: 140 mEq/L (ref 135–145)
Total Bilirubin: 0.3 mg/dL (ref 0.2–1.2)
Total Protein: 6.6 g/dL (ref 6.0–8.3)

## 2018-11-06 LAB — LIPID PANEL
Cholesterol: 155 mg/dL (ref 0–200)
HDL: 33.8 mg/dL — ABNORMAL LOW (ref >39.00)
Total CHOL/HDL Ratio: 5
Triglycerides: 454 mg/dL — ABNORMAL HIGH (ref 0.0–149.0)

## 2018-11-06 LAB — TSH: TSH: 1.86 u[IU]/mL (ref 0.35–4.50)

## 2018-11-06 LAB — LDL CHOLESTEROL, DIRECT: Direct LDL: 86 mg/dL

## 2018-11-06 LAB — HEMOGLOBIN A1C: Hgb A1c MFr Bld: 7.8 % — ABNORMAL HIGH (ref 4.6–6.5)

## 2018-11-21 ENCOUNTER — Other Ambulatory Visit: Payer: Self-pay | Admitting: Family Medicine

## 2018-11-21 DIAGNOSIS — E782 Mixed hyperlipidemia: Secondary | ICD-10-CM

## 2018-11-21 MED ORDER — ROSUVASTATIN CALCIUM 40 MG PO TABS: 40.0000 mg | ORAL_TABLET | Freq: Every day | ORAL | 1 refills | Status: DC

## 2018-11-24 ENCOUNTER — Other Ambulatory Visit: Payer: Self-pay | Admitting: Family Medicine

## 2018-12-18 ENCOUNTER — Other Ambulatory Visit: Payer: Self-pay | Admitting: Family Medicine

## 2018-12-18 NOTE — Telephone Encounter (Signed)
Please contact the patient and find out what she is taking this for currently.  Thanks.

## 2018-12-23 NOTE — Telephone Encounter (Signed)
Arthritis that's in her shoulders. She stated that she is called every time that this refill is submitted & she tells [place] this time after time.

## 2019-01-07 ENCOUNTER — Other Ambulatory Visit: Payer: Self-pay

## 2019-01-07 ENCOUNTER — Other Ambulatory Visit (INDEPENDENT_AMBULATORY_CARE_PROVIDER_SITE_OTHER): Payer: Managed Care, Other (non HMO)

## 2019-01-07 DIAGNOSIS — E782 Mixed hyperlipidemia: Secondary | ICD-10-CM

## 2019-01-07 LAB — COMPREHENSIVE METABOLIC PANEL
ALT: 36 U/L — ABNORMAL HIGH (ref 0–35)
AST: 21 U/L (ref 0–37)
Albumin: 4.7 g/dL (ref 3.5–5.2)
Alkaline Phosphatase: 71 U/L (ref 39–117)
BUN: 18 mg/dL (ref 6–23)
CO2: 27 mEq/L (ref 19–32)
Calcium: 10 mg/dL (ref 8.4–10.5)
Chloride: 101 mEq/L (ref 96–112)
Creatinine, Ser: 0.6 mg/dL (ref 0.40–1.20)
GFR: 104.16 mL/min (ref >60.00)
Glucose, Bld: 138 mg/dL — ABNORMAL HIGH (ref 70–99)
Potassium: 3.9 mEq/L (ref 3.5–5.1)
Sodium: 137 mEq/L (ref 135–145)
Total Bilirubin: 0.4 mg/dL (ref 0.2–1.2)
Total Protein: 6.9 g/dL (ref 6.0–8.3)

## 2019-01-07 LAB — LIPID PANEL
Cholesterol: 160 mg/dL (ref 0–200)
HDL: 36.1 mg/dL — ABNORMAL LOW (ref >39.00)
NonHDL: 124.03
Total CHOL/HDL Ratio: 4
Triglycerides: 205 mg/dL — ABNORMAL HIGH (ref 0.0–149.0)
VLDL: 41 mg/dL — ABNORMAL HIGH (ref 0.0–40.0)

## 2019-01-07 LAB — LDL CHOLESTEROL, DIRECT: Direct LDL: 91 mg/dL

## 2019-01-09 ENCOUNTER — Telehealth: Payer: Self-pay | Admitting: Family Medicine

## 2019-01-09 ENCOUNTER — Other Ambulatory Visit: Payer: Self-pay | Admitting: Family Medicine

## 2019-01-09 DIAGNOSIS — E782 Mixed hyperlipidemia: Secondary | ICD-10-CM

## 2019-01-09 MED ORDER — EZETIMIBE 10 MG PO TABS: 10.0000 mg | ORAL_TABLET | Freq: Every day | ORAL | 3 refills | Status: DC

## 2019-01-09 NOTE — Telephone Encounter (Signed)
Please let the patient know that I received a letter from her insurance company noting that the metformin ER that the patient is on could be part of a recall. Please advise the patient to contact her pharmacy to see if the metformin she has been taking is part of the recall and if it is we can change her to something different. Thanks.

## 2019-01-10 ENCOUNTER — Ambulatory Visit (INDEPENDENT_AMBULATORY_CARE_PROVIDER_SITE_OTHER): Payer: Managed Care, Other (non HMO) | Admitting: Family Medicine

## 2019-01-10 ENCOUNTER — Other Ambulatory Visit: Payer: Self-pay

## 2019-01-10 ENCOUNTER — Encounter: Payer: Self-pay | Admitting: Family Medicine

## 2019-01-10 VITALS — Ht 58.5 in | Wt 171.0 lb

## 2019-01-10 DIAGNOSIS — E785 Hyperlipidemia, unspecified: Secondary | ICD-10-CM | POA: Diagnosis not present

## 2019-01-10 DIAGNOSIS — Z720 Tobacco use: Secondary | ICD-10-CM | POA: Diagnosis not present

## 2019-01-10 DIAGNOSIS — E119 Type 2 diabetes mellitus without complications: Secondary | ICD-10-CM | POA: Diagnosis not present

## 2019-01-10 DIAGNOSIS — E039 Hypothyroidism, unspecified: Secondary | ICD-10-CM | POA: Diagnosis not present

## 2019-01-10 DIAGNOSIS — I10 Essential (primary) hypertension: Secondary | ICD-10-CM

## 2019-01-10 MED ORDER — METFORMIN HCL 500 MG PO TABS: 500.0000 mg | ORAL_TABLET | Freq: Two times a day (BID) | ORAL | 3 refills | Status: DC

## 2019-01-10 NOTE — Assessment & Plan Note (Addendum)
Smoking cessation counseling was provided.  Approximately 3 minutes were spent discussing the rationale for tobacco cessation and strategies for doing so.  Discussed risk of worsening COPD, cardiovascular issues, stroke, and cancer.  Adjuncts, including nicotine patches and nicotine lozenges were recommended. Follow-up in 4 months regarding this.  Patient will continue to try to cut down.

## 2019-01-10 NOTE — Progress Notes (Signed)
Virtual Visit via video Note  This visit type was conducted due to national recommendations for restrictions regarding the COVID-19 pandemic (e.g. social distancing).  This format is felt to be most appropriate for this patient at this time.  All issues noted in this document were discussed and addressed.  No physical exam was performed (except for noted visual exam findings with Video Visits).   I connected with Brenda Farmer today at 11:00 AM EDT by a video enabled telemedicine application and verified that I am speaking with the correct person using two identifiers. Location patient: car, not driving Location provider: work  Persons participating in the virtual visit: patient, provider  I discussed the limitations, risks, security and privacy concerns of performing an evaluation and management service by telephone and the availability of in person appointments. I also discussed with the patient that there may be a patient responsible charge related to this service. The patient expressed understanding and agreed to proceed.   Reason for visit: follow-up  HPI: Diabetes: Typically 130-140 fasting.  Occasionally up to 165.  Taking Trulicity, Amaryl, and Jardiance.  She stopped metformin given the recall on extended release Metformin.  She has been off of that for 2 months.  No polyuria or polydipsia.  No hypoglycemia.  Hypertension: She notes her blood pressure has been running well controlled.  Taking hydrochlorothiazide and lisinopril.  No chest pain, shortness of breath, or edema.  Hypothyroidism: Taking Synthroid.  No skin changes, heat intolerance, or cold intolerance.  Tobacco abuse: Patient is cutting back progressively.  She is down to half a pack per day.  She is thinking about quitting.  She has tried prescription medications previously though did not react well to them.  She has not tried nicotine replacement products.   ROS: See pertinent positives and negatives per HPI.   Past Medical History:  Diagnosis Date  . Arthritis   . Chicken pox   . Diabetes mellitus without complication ([place])   . Diabetic eye exam ([place])   . History of mammogram 08/16/2012   neg  . History of Papanicolaou smear of cervix 06/21/2012   neg/neg  . Hyperlipidemia   . Hypertension   . Hypothyroidism (acquired)   . Obesity   . Sleep apnea   . Thyroid disease     Past Surgical History:  Procedure Laterality Date  . ABDOMINAL HYSTERECTOMY  2012   Cervix & ovaries intact  . APPENDECTOMY    . CESAREAN SECTION    . COLONOSCOPY WITH PROPOFOL N/A 11/05/2015   Procedure: COLONOSCOPY WITH PROPOFOL;  Surgeon: Lollie Sails, MD;  Location: [facility] ENDOSCOPY;  Service: Endoscopy;  Laterality: N/A;    Family History  Problem Relation Age of Onset  . Arthritis Mother   . Hyperlipidemia Mother   . Cervical cancer Mother 67       Malignant  . Alcohol abuse Father   . Hyperlipidemia Father   . Heart disease Father   . Hypertension Father   . Stroke Maternal Grandmother   . Diabetes Maternal Grandmother   . Arthritis Maternal Grandfather   . Diabetes Maternal Grandfather        DM Type 2  . Asthma Paternal Grandmother   . Heart disease Paternal Grandmother   . Lung cancer Paternal Grandmother        Malignant  . Prostate cancer Paternal Grandfather   . Hyperlipidemia Paternal Grandfather   . Hypertension Paternal Grandfather   . Heart disease Paternal Grandfather   . Lung  cancer Paternal Grandfather   . Breast cancer Neg Hx     SOCIAL HX: Smoker   Current Outpatient Medications:  .  Blood Glucose Monitoring Suppl (CONTOUR BLOOD GLUCOSE SYSTEM) w/Device KIT, Used to check blood glucose 3 times daily, diagnosis code E11.9, Disp: 1 each, Rfl: 0 .  diclofenac (VOLTAREN) 75 MG EC tablet, TAKE 1 TABLET BY MOUTH TWICE DAILY AS NEEDED FOR MILD PAIN, Disp: 180 tablet, Rfl: 0 .  Dulaglutide (TRULICITY) 3.15 QM/0.8QP SOPN, Inject 0.75 mg into the skin once a week., Disp: 12 pen,  Rfl: 2 .  ezetimibe (ZETIA) 10 MG tablet, Take 1 tablet (10 mg total) by mouth daily., Disp: 90 tablet, Rfl: 3 .  glimepiride (AMARYL) 1 MG tablet, Take 1 tablet (1 mg total) by mouth daily with breakfast., Disp: 90 tablet, Rfl: 1 .  glimepiride (AMARYL) 4 MG tablet, Take 1 tablet by mouth once daily with breakfast, Disp: 90 tablet, Rfl: 0 .  glucose blood (CONTOUR TEST) test strip, Use as instructed, Disp: 100 each, Rfl: 12 .  hydrochlorothiazide (HYDRODIURIL) 25 MG tablet, Take 1 tablet by mouth once daily, Disp: 90 tablet, Rfl: 0 .  JARDIANCE 25 MG TABS tablet, Take 1 tablet by mouth once daily, Disp: 90 tablet, Rfl: 0 .  levothyroxine (SYNTHROID) 125 MCG tablet, Take 1 tablet (125 mcg total) by mouth daily., Disp: 90 tablet, Rfl: 3 .  lisinopril (ZESTRIL) 20 MG tablet, Take 1 tablet by mouth once daily, Disp: 90 tablet, Rfl: 0 .  Multiple Vitamin (MULTIVITAMIN) tablet, Take 1 tablet by mouth daily., Disp: , Rfl:  .  PROAIR HFA 108 (90 Base) MCG/ACT inhaler, INHALE 1 PUFF BY MOUTH EVERY 4 HOURS AS NEEDED FOR WHEEZING OR SHORTNESS OF BREATH, Disp: 9 g, Rfl: 0 .  rosuvastatin (CRESTOR) 40 MG tablet, Take 1 tablet (40 mg total) by mouth daily., Disp: 90 tablet, Rfl: 1 .  metFORMIN (GLUCOPHAGE) 500 MG tablet, Take 1 tablet (500 mg total) by mouth 2 (two) times daily with a meal., Disp: 180 tablet, Rfl: 3  EXAM:  VITALS per patient if applicable: None.  GENERAL: alert, oriented, appears well and in no acute distress  HEENT: atraumatic, conjunttiva clear, no obvious abnormalities on inspection of external nose and ears  NECK: normal movements of the head and neck  LUNGS: on inspection no signs of respiratory distress, breathing rate appears normal, no obvious gross SOB, gasping or wheezing  CV: no obvious cyanosis  MS: moves all visible extremities without noticeable abnormality  PSYCH/NEURO: pleasant and cooperative, no obvious depression or anxiety, speech and thought processing grossly  intact  ASSESSMENT AND PLAN:  Discussed the following assessment and plan:  Benign essential HTN Reports this is well controlled.  She will continue her current regimen.  Controlled type 2 diabetes mellitus without complication (H[facility]) Seems to be decently controlled.  We will add back immediate release Metformin 500 mg twice daily.  She will continue her other medications.  She will have lab work done in 2 months.  Hypothyroidism Continue Synthroid.  TSH well controlled.  Hyperlipidemia Lipids have improved with regards to her triglycerides though LDL is stable.  We will add Zetia.  Discussed goal of less than 70 for LDL.  Plan to recheck in 2 months.  Tobacco abuse Smoking cessation counseling was provided.  Approximately 3 minutes were spent discussing the rationale for tobacco cessation and strategies for doing so.  Discussed risk of worsening COPD, cardiovascular issues, stroke, and cancer.  Adjuncts, including nicotine patches  and nicotine lozenges were recommended. Follow-up in 4 months regarding this.  Patient will continue to try to cut down.    Social distancing precautions and sick precautions given regarding COVID-19.  I discussed the assessment and treatment plan with the patient. The patient was provided an opportunity to ask questions and all were answered. The patient agreed with the plan and demonstrated an understanding of the instructions.   The patient was advised to call back or seek an in-person evaluation if the symptoms worsen or if the condition fails to improve as anticipated.   Tommi Rumps, MD

## 2019-01-10 NOTE — Assessment & Plan Note (Addendum)
Lipids have improved with regards to her triglycerides though LDL is stable.  We will add Zetia.  Discussed goal of less than 70 for LDL.  Plan to recheck in 2 months.

## 2019-01-10 NOTE — Assessment & Plan Note (Signed)
Continue Synthroid.  TSH well controlled.

## 2019-01-10 NOTE — Assessment & Plan Note (Signed)
Reports this is well controlled.  She will continue her current regimen.

## 2019-01-10 NOTE — Assessment & Plan Note (Signed)
Seems to be decently controlled.  We will add back immediate release Metformin 500 mg twice daily.  She will continue her other medications.  She will have lab work done in 2 months.

## 2019-01-13 NOTE — Telephone Encounter (Signed)
Called and patient stated this has already been done, she has her new RX for new medication.  Nina,cma

## 2019-01-19 ENCOUNTER — Other Ambulatory Visit: Payer: Self-pay | Admitting: Family Medicine

## 2019-01-26 ENCOUNTER — Other Ambulatory Visit: Payer: Self-pay | Admitting: Family Medicine

## 2019-01-31 ENCOUNTER — Other Ambulatory Visit: Payer: Self-pay

## 2019-01-31 ENCOUNTER — Ambulatory Visit (INDEPENDENT_AMBULATORY_CARE_PROVIDER_SITE_OTHER): Payer: Managed Care, Other (non HMO)

## 2019-01-31 DIAGNOSIS — Z23 Encounter for immunization: Secondary | ICD-10-CM

## 2019-02-08 ENCOUNTER — Other Ambulatory Visit: Payer: Self-pay | Admitting: Family Medicine

## 2019-02-13 ENCOUNTER — Ambulatory Visit (INDEPENDENT_AMBULATORY_CARE_PROVIDER_SITE_OTHER): Payer: Managed Care, Other (non HMO) | Admitting: Family Medicine

## 2019-02-13 ENCOUNTER — Encounter: Payer: Self-pay | Admitting: Family Medicine

## 2019-02-13 VITALS — Ht 58.5 in | Wt 172.0 lb

## 2019-02-13 DIAGNOSIS — M542 Cervicalgia: Secondary | ICD-10-CM

## 2019-02-13 MED ORDER — CYCLOBENZAPRINE HCL 5 MG PO TABS: 5.0000 mg | ORAL_TABLET | Freq: Three times a day (TID) | ORAL | 1 refills | Status: DC | PRN

## 2019-02-13 NOTE — Patient Instructions (Signed)

## 2019-02-13 NOTE — Progress Notes (Signed)
Patient ID: Brenda Farmer, female   DOB: 1966/02/21, 53 y.o.   MRN: [id number]    Virtual Visit via video Note  This visit type was conducted due to national recommendations for restrictions regarding the COVID-19 pandemic (e.g. social distancing).  This format is felt to be most appropriate for this patient at this time.  All issues noted in this document were discussed and addressed.  No physical exam was performed (except for noted visual exam findings with Video Visits).   I connected with Barnet Glasgow today at 10:20 AM EDT by a video enabled telemedicine application and verified that I am speaking with the correct person using two identifiers. Location patient: home Location provider: work or home office Persons participating in the virtual visit: patient, provider  I discussed the limitations, risks, security and privacy concerns of performing an evaluation and management service by video and the availability of in person appointments. I also discussed with the patient that there may be a patient responsible charge related to this service. The patient expressed understanding and agreed to proceed.   HPI:  Patient and I connected via video due to complaint of neck pain.  Patient has been sitting at a computer many hours during the day for her work due to different changes required by the pandemic.  Wonders if a long hours in front of the computer have caused her to develop neck stiffness.  States the pain is more so on right side of neck and notices a pulling sensation when turning head towards the left side.  No fever or chills.  No known neck injury.  No numbness in extremities.  No sharp stabbing pain in neck or head.    ROS: See pertinent positives and negatives per HPI.  Past Medical History:  Diagnosis Date  . Arthritis   . Chicken pox   . Diabetes mellitus without complication ([place])   . Diabetic eye exam ([place])   . History of mammogram 08/16/2012   neg  .  History of Papanicolaou smear of cervix 06/21/2012   neg/neg  . Hyperlipidemia   . Hypertension   . Hypothyroidism (acquired)   . Obesity   . Sleep apnea   . Thyroid disease     Past Surgical History:  Procedure Laterality Date  . ABDOMINAL HYSTERECTOMY  2012   Cervix & ovaries intact  . APPENDECTOMY    . CESAREAN SECTION    . COLONOSCOPY WITH PROPOFOL N/A 11/05/2015   Procedure: COLONOSCOPY WITH PROPOFOL;  Surgeon: Lollie Sails, MD;  Location[facility] ENDOSCOPY;  Service: Endoscopy;  Laterality: N/A;    Family History  Problem Relation Age of Onset  . Arthritis Mother   . Hyperlipidemia Mother   . Cervical cancer Mother 29       Malignant  . Alcohol abuse Father   . Hyperlipidemia Father   . Heart disease Father   . Hypertension Father   . Stroke Maternal Grandmother   . Diabetes Maternal Grandmother   . Arthritis Maternal Grandfather   . Diabetes Maternal Grandfather        DM Type 2  . Asthma Paternal Grandmother   . Heart disease Paternal Grandmother   . Lung cancer Paternal Grandmother        Malignant  . Prostate cancer Paternal Grandfather   . Hyperlipidemia Paternal Grandfather   . Hypertension Paternal Grandfather   . Heart disease Paternal Grandfather   . Lung cancer Paternal Grandfather   . Breast cancer Neg Hx  Social History   Tobacco Use  . Smoking status: Current Every Day Smoker    Packs/day: 0.50    Types: Cigarettes  . Smokeless tobacco: Never Used  Substance Use Topics  . Alcohol use: Yes    Alcohol/week: 0.0 - 1.0 standard drinks    Comment: Rare usage       Current Outpatient Medications:  .  Blood Glucose Monitoring Suppl (CONTOUR BLOOD GLUCOSE SYSTEM) w/Device KIT, Used to check blood glucose 3 times daily, diagnosis code E11.9, Disp: 1 each, Rfl: 0 .  diclofenac (VOLTAREN) 75 MG EC tablet, TAKE 1 TABLET BY MOUTH TWICE DAILY AS NEEDED FOR MILD PAIN, Disp: 180 tablet, Rfl: 0 .  Dulaglutide (TRULICITY) 8.92 JJ/9.4RD SOPN, Inject  0.75 mg into the skin once a week., Disp: 12 pen, Rfl: 2 .  ezetimibe (ZETIA) 10 MG tablet, Take 1 tablet (10 mg total) by mouth daily., Disp: 90 tablet, Rfl: 3 .  glimepiride (AMARYL) 1 MG tablet, Take 1 tablet (1 mg total) by mouth daily with breakfast., Disp: 90 tablet, Rfl: 1 .  glimepiride (AMARYL) 4 MG tablet, Take 4 mg by mouth daily with breakfast., Disp: , Rfl:  .  glucose blood (CONTOUR TEST) test strip, Use as instructed, Disp: 100 each, Rfl: 12 .  hydrochlorothiazide (HYDRODIURIL) 25 MG tablet, Take 1 tablet by mouth once daily, Disp: 90 tablet, Rfl: 0 .  JARDIANCE 25 MG TABS tablet, Take 1 tablet by mouth once daily, Disp: 90 tablet, Rfl: 1 .  levothyroxine (SYNTHROID) 125 MCG tablet, Take 1 tablet (125 mcg total) by mouth daily., Disp: 90 tablet, Rfl: 3 .  lisinopril (ZESTRIL) 20 MG tablet, Take 1 tablet by mouth once daily, Disp: 90 tablet, Rfl: 0 .  metFORMIN (GLUCOPHAGE) 500 MG tablet, Take 1 tablet (500 mg total) by mouth 2 (two) times daily with a meal., Disp: 180 tablet, Rfl: 3 .  Multiple Vitamin (MULTIVITAMIN) tablet, Take 1 tablet by mouth daily., Disp: , Rfl:  .  PROAIR HFA 108 (90 Base) MCG/ACT inhaler, INHALE 1 PUFF BY MOUTH EVERY 4 HOURS AS NEEDED FOR WHEEZING FOR SHORTNESS OF BREATH, Disp: 9 g, Rfl: 0 .  rosuvastatin (CRESTOR) 40 MG tablet, Take 1 tablet (40 mg total) by mouth daily., Disp: 90 tablet, Rfl: 1  EXAM:  GENERAL: alert, oriented, appears well and in no acute distress  HEENT: atraumatic, conjunttiva clear, no obvious abnormalities on inspection of external nose and ears  NECK: normal movements of the head and neck  LUNGS: on inspection no signs of respiratory distress, breathing rate appears normal, no obvious gross SOB, gasping or wheezing  CV: no obvious cyanosis  MS: moves all visible extremities without noticeable abnormality.  Can turn head to the right side and left side can put chin to chest and lean head all the way back can do rolling motion  of head.  PSYCH/NEURO: pleasant and cooperative, no obvious depression or anxiety, speech and thought processing grossly intact  ASSESSMENT AND PLAN:  Discussed the following assessment and plan:  Neck pain-suspect neck pain is related to strain from many hours a computer.  Advised patient to do stretching and range of motion exercises multiple times during the day.  She will use muscle relaxer as needed for muscle spasm.  Discussed topical rubs like BenGay, Biofreeze and heating pad to help relieve pain.  Advised if pain continues to persist on the next 1 to 2 weeks we can consider x-ray imaging.   I discussed the assessment and treatment  plan with the patient. The patient was provided an opportunity to ask questions and all were answered. The patient agreed with the plan and demonstrated an understanding of the instructions.   The patient was advised to call back or seek an in-person evaluation if the symptoms worsen or if the condition fails to improve as anticipated.  Jodelle Green, FNP

## 2019-02-19 ENCOUNTER — Telehealth: Payer: Self-pay | Admitting: *Deleted

## 2019-02-19 NOTE — Telephone Encounter (Signed)
Copied from [place] [id number]. Topic: General - Other >> Feb 19, 2019  9:23 AM Rainey Pines A wrote: Patient would like a callback in regards to muscle relaxers not working for her neck pain. Patient was advised to call back if pain got worse

## 2019-02-26 ENCOUNTER — Encounter: Payer: Self-pay | Admitting: Family Medicine

## 2019-02-26 NOTE — Telephone Encounter (Signed)
It does not look like this message was forwarded to me until today though was received last week. Can you check in to this to see if there was an issue with this getting in to [facility] in basket last week? Thanks.

## 2019-02-28 ENCOUNTER — Other Ambulatory Visit: Payer: Self-pay

## 2019-02-28 ENCOUNTER — Ambulatory Visit (INDEPENDENT_AMBULATORY_CARE_PROVIDER_SITE_OTHER): Payer: Managed Care, Other (non HMO) | Admitting: Family Medicine

## 2019-02-28 ENCOUNTER — Ambulatory Visit (INDEPENDENT_AMBULATORY_CARE_PROVIDER_SITE_OTHER): Payer: Managed Care, Other (non HMO)

## 2019-02-28 ENCOUNTER — Other Ambulatory Visit: Payer: Self-pay | Admitting: Family Medicine

## 2019-02-28 ENCOUNTER — Encounter: Payer: Self-pay | Admitting: Family Medicine

## 2019-02-28 VITALS — BP 118/70 | HR 95 | Temp 97.8°F | Ht 58.5 in | Wt 172.2 lb

## 2019-02-28 DIAGNOSIS — M542 Cervicalgia: Secondary | ICD-10-CM

## 2019-02-28 DIAGNOSIS — M5412 Radiculopathy, cervical region: Secondary | ICD-10-CM | POA: Diagnosis not present

## 2019-02-28 MED ORDER — CYCLOBENZAPRINE HCL 10 MG PO TABS: 10.0000 mg | ORAL_TABLET | Freq: Three times a day (TID) | ORAL | 0 refills | Status: DC | PRN

## 2019-02-28 MED ORDER — PREDNISONE 20 MG PO TABS: 40.0000 mg | ORAL_TABLET | Freq: Every day | ORAL | 0 refills | Status: DC

## 2019-02-28 NOTE — Assessment & Plan Note (Addendum)
Symptoms are consistent with cervical radiculopathy.  We will start her on prednisone.  She will monitor her glucose closely while on prednisone.  Will obtain an x-ray.  We will refill her Flexeril.  Discussed risk of drowsiness with the Flexeril.  Discussed reasons to seek medical attention in the emergency room.  If not improving consider physical therapy versus MRI.

## 2019-02-28 NOTE — Patient Instructions (Addendum)
Nice to see you. We will get an x-ray of your neck today and start you on prednisone. You can take the Flexeril as a muscle relaxer.  Please monitor for drowsiness with this.  Please do not take any NSAIDs such as ibuprofen or Aleve while you are on the prednisone. If you develop worsening numbness, or develop weakness, increasing pain, or fevers please seek medical attention immediately.

## 2019-02-28 NOTE — Progress Notes (Signed)
Tommi Rumps, MD Phone: [phone]  Brenda Farmer is a 53 y.o. female who presents today for same-day visit.  Neck pain: Patient notes right-sided neck pain that is been going on for a number of weeks now.  She was seen previously by FNP.  It has slightly improved with going to the chiropractor though she does have significant nighttime symptoms.  She has pain radiating from her neck to her right shoulder with some numbness of her anterior right shoulder.  She notes nighttime pain is most intense.  Advil only last 4 to 5 hours.  The Flexeril did help her sleep yesterday though otherwise has not helped with the pain.  She notes the discomfort during the daytime is a 3-5/10.  No bowel or bladder incontinence.  No numbness elsewhere.  No weakness.  Social History   Tobacco Use  Smoking Status Current Every Day Smoker  . Packs/day: 0.50  . Types: Cigarettes  Smokeless Tobacco Never Used     ROS see history of present illness  Objective  Physical Exam Vitals:   02/28/19 1056  BP: 118/70  Pulse: 95  Temp: 97.8 F (36.6 C)  SpO2: 96%    BP Readings from Last 3 Encounters:  02/28/19 118/70  05/29/18 104/66  02/18/18 120/60   Wt Readings from Last 3 Encounters:  02/28/19 172 lb 3.2 oz (78.1 kg)  02/13/19 172 lb (78 kg)  01/10/19 171 lb (77.6 kg)    Physical Exam Constitutional:      General: She is not in acute distress.    Appearance: She is not diaphoretic.  Cardiovascular:     Rate and Rhythm: Normal rate and regular rhythm.     Heart sounds: Normal heart sounds.  Pulmonary:     Effort: Pulmonary effort is normal.     Breath sounds: Normal breath sounds.  Musculoskeletal:     Comments: No midline neck tenderness, no midline neck step-off, there is no muscular neck or shoulder tenderness, negative Spurling's bilaterally  Skin:    General: Skin is warm and dry.  Neurological:     Mental Status: She is alert.     Comments: 5/5 strength in bilateral  biceps, triceps, grip, quads, hamstrings, plantar and dorsiflexion, sensation to light touch slightly decreased over her right anterior shoulder though otherwise intact in bilateral UE and LE, normal gait, 2+ patellar, brachioradialis, and biceps reflexes      Assessment/Plan: Please see individual problem list.  Cervical radiculopathy Symptoms are consistent with cervical radiculopathy.  We will start her on prednisone.  She will monitor her glucose closely while on prednisone.  Will obtain an x-ray.  We will refill her Flexeril.  Discussed risk of drowsiness with the Flexeril.  Discussed reasons to seek medical attention in the emergency room.  If not improving consider physical therapy versus MRI.   Orders Placed This Encounter  Procedures  . DG Cervical Spine Complete    Standing Status:   Future    Number of Occurrences:   1    Standing Expiration Date:   04/29/2020    Order Specific Question:   Reason for Exam (SYMPTOM  OR DIAGNOSIS REQUIRED)    Answer:   neck pain with radicular symptoms to right shoulder, numbness of right shoulder    Order Specific Question:   Is patient pregnant?    Answer:   No    Order Specific Question:   Preferred imaging location?    Answer:   [facility]  Order Specific Question:   Radiology Contrast Protocol - do NOT remove file path    Answer:   \\[email or web address].[email or web address]    Meds ordered this encounter  Medications  . predniSONE (DELTASONE) 20 MG tablet    Sig: Take 2 tablets (40 mg total) by mouth daily with breakfast.    Dispense:  10 tablet    Refill:  0  . cyclobenzaprine (FLEXERIL) 10 MG tablet    Sig: Take 1 tablet (10 mg total) by mouth 3 (three) times daily as needed for muscle spasms.    Dispense:  30 tablet    Refill:  0     Tommi Rumps, MD [place]

## 2019-03-03 NOTE — Telephone Encounter (Signed)
This was an error that occurred due to un clicking and the message not being highlighted. We have worked on solutions for making CMA box more manageable.

## 2019-03-14 ENCOUNTER — Other Ambulatory Visit (INDEPENDENT_AMBULATORY_CARE_PROVIDER_SITE_OTHER): Payer: Managed Care, Other (non HMO)

## 2019-03-14 ENCOUNTER — Other Ambulatory Visit: Payer: Self-pay

## 2019-03-14 DIAGNOSIS — E119 Type 2 diabetes mellitus without complications: Secondary | ICD-10-CM

## 2019-03-14 DIAGNOSIS — E782 Mixed hyperlipidemia: Secondary | ICD-10-CM | POA: Diagnosis not present

## 2019-03-14 LAB — HEMOGLOBIN A1C: Hgb A1c MFr Bld: 7.8 % — ABNORMAL HIGH (ref 4.6–6.5)

## 2019-03-14 LAB — LIPID PANEL
Cholesterol: 92 mg/dL (ref 0–200)
HDL: 29.6 mg/dL — ABNORMAL LOW (ref >39.00)
NonHDL: 62.67
Total CHOL/HDL Ratio: 3
Triglycerides: 263 mg/dL — ABNORMAL HIGH (ref 0.0–149.0)
VLDL: 52.6 mg/dL — ABNORMAL HIGH (ref 0.0–40.0)

## 2019-03-14 LAB — LDL CHOLESTEROL, DIRECT: Direct LDL: 30 mg/dL

## 2019-03-18 ENCOUNTER — Other Ambulatory Visit: Payer: Self-pay | Admitting: Family Medicine

## 2019-03-31 ENCOUNTER — Other Ambulatory Visit: Payer: Self-pay | Admitting: Family Medicine

## 2019-04-22 ENCOUNTER — Other Ambulatory Visit: Payer: Self-pay | Admitting: Family Medicine

## 2019-06-01 ENCOUNTER — Other Ambulatory Visit: Payer: Self-pay | Admitting: Family Medicine

## 2019-06-11 ENCOUNTER — Other Ambulatory Visit: Payer: Self-pay

## 2019-06-11 ENCOUNTER — Ambulatory Visit: Payer: Managed Care, Other (non HMO) | Admitting: Family Medicine

## 2019-06-11 ENCOUNTER — Encounter: Payer: Self-pay | Admitting: Family Medicine

## 2019-06-11 VITALS — BP 110/70 | HR 100 | Temp 97.4°F | Ht 58.5 in | Wt 167.2 lb

## 2019-06-11 DIAGNOSIS — G4733 Obstructive sleep apnea (adult) (pediatric): Secondary | ICD-10-CM

## 2019-06-11 DIAGNOSIS — Z720 Tobacco use: Secondary | ICD-10-CM

## 2019-06-11 DIAGNOSIS — I10 Essential (primary) hypertension: Secondary | ICD-10-CM | POA: Diagnosis not present

## 2019-06-11 DIAGNOSIS — E785 Hyperlipidemia, unspecified: Secondary | ICD-10-CM

## 2019-06-11 DIAGNOSIS — E119 Type 2 diabetes mellitus without complications: Secondary | ICD-10-CM

## 2019-06-11 NOTE — Assessment & Plan Note (Signed)
Continue Crestor 

## 2019-06-11 NOTE — Assessment & Plan Note (Signed)
Adequately controlled based on lack of symptoms.  She will continue her CPAP.

## 2019-06-11 NOTE — Assessment & Plan Note (Signed)
Well-controlled.  Continue current regimen.  Check BMP next week.

## 2019-06-11 NOTE — Assessment & Plan Note (Signed)
Seems to be well controlled.  She is just short of being due for an A1c.  She will come back next week for this.  She will continue her current regimen.

## 2019-06-11 NOTE — Patient Instructions (Addendum)
Nice to see you: We will have you come back in a week to check labs. Please continue your current medications.  Please check with your insurance regarding the shingles vaccine.  It is called Shingrix.

## 2019-06-11 NOTE — Assessment & Plan Note (Signed)
Encourage smoking cessation.  She will continue to try to cut down.  Discussed risk of continued smoking.

## 2019-06-11 NOTE — Progress Notes (Signed)
Tommi Rumps, MD Phone: [phone]  Brenda Farmer is a 54 y.o. female who presents today for f/u.  HYPERTENSION Disease Monitoring: Blood pressure range-similar to today. Chest pain-no.      Dyspnea-no. Medications: Compliance- taking HCTZ, lisinopril    Edema-no.  DIABETES Disease Monitoring: Blood Sugar ranges-typically 120-130 fasting. Polyuria/phagia/dipsia-no.      Optho- UTD Medications: Compliance- taking trulicity, glimeperide, jardiance, metformin Hypoglycemic symptoms-rare and only occurs if she does not eat  HYPERLIPIDEMIA Disease Monitoring: See symptoms for Hypertension Medications: Compliance- taking crestor, zetia Right upper quadrant pain-no. Muscle aches-no.  Tobacco abuse: Patient notes she is working on quitting.  She is smoking about half pack per day and is cutting down from there.  Using half cigarettes.  She smokes since age 15 and did smoke 1 pack/day for a period of time though mostly has only smoked about 1/2 pack a day.  OSA: Patient is using her CPAP nightly for 7 to 8 hours.  She wakes up well rested.  No hypersomnia.     Social History   Tobacco Use  Smoking Status Current Every Day Smoker  . Packs/day: 0.50  . Types: Cigarettes  Smokeless Tobacco Never Used  Tobacco Comment   working on quitting     ROS see history of present illness  Objective  Physical Exam Vitals:   06/11/19 1029  BP: 110/70  Pulse: 100  Temp: (!) 97.4 F (36.3 C)  SpO2: 95%    BP Readings from Last 3 Encounters:  06/11/19 110/70  02/28/19 118/70  05/29/18 104/66   Wt Readings from Last 3 Encounters:  06/11/19 167 lb 3.2 oz (75.8 kg)  02/28/19 172 lb 3.2 oz (78.1 kg)  02/13/19 172 lb (78 kg)    Physical Exam Constitutional:      General: She is not in acute distress.    Appearance: She is not diaphoretic.  Cardiovascular:     Rate and Rhythm: Normal rate and regular rhythm.     Heart sounds: Normal heart sounds.  Pulmonary:      Effort: Pulmonary effort is normal.     Breath sounds: Normal breath sounds.  Musculoskeletal:     Right lower leg: No edema.     Left lower leg: No edema.  Skin:    General: Skin is warm and dry.  Neurological:     Mental Status: She is alert.    Diabetic Foot Exam - Simple   Simple Foot Form Diabetic Foot exam was performed with the following findings: Yes 06/11/2019 11:01 AM  Visual Inspection No deformities, no ulcerations, no other skin breakdown bilaterally: Yes Sensation Testing Intact to touch and monofilament testing bilaterally: Yes Pulse Check Posterior Tibialis and Dorsalis pulse intact bilaterally: Yes Comments      Assessment/Plan: Please see individual problem list.  Benign essential HTN Well-controlled.  Continue current regimen.  Check BMP next week.  OSA (obstructive sleep apnea) Adequately controlled based on lack of symptoms.  She will continue her CPAP.  Controlled type 2 diabetes mellitus without complication (HCC) Seems to be well controlled.  She is just short of being due for an A1c.  She will come back next week for this.  She will continue her current regimen.  Hyperlipidemia Continue Crestor.  Tobacco abuse Encourage smoking cessation.  She will continue to try to cut down.  Discussed risk of continued smoking.   Orders Placed This Encounter  Procedures  . Hemoglobin A1c    Standing Status:   Future  Standing Expiration Date:   06/10/2020  . Basic Metabolic Panel (BMET)    Standing Status:   Future    Standing Expiration Date:   06/10/2020    No orders of the defined types were placed in this encounter.  Patient will check on coverage of shingles vaccine with her insurance.  Plan to give this in 1 week when she comes in for labs.  This visit occurred during the SARS-CoV-2 public health emergency.  Safety protocols were in place, including screening questions prior to the visit, additional usage of staff PPE, and extensive cleaning of  exam room while observing appropriate contact time as indicated for disinfecting solutions.    Tommi Rumps, MD [place]

## 2019-06-17 ENCOUNTER — Other Ambulatory Visit: Payer: Self-pay | Admitting: Family Medicine

## 2019-06-19 ENCOUNTER — Other Ambulatory Visit: Payer: Self-pay

## 2019-06-19 ENCOUNTER — Other Ambulatory Visit (INDEPENDENT_AMBULATORY_CARE_PROVIDER_SITE_OTHER): Payer: Managed Care, Other (non HMO)

## 2019-06-19 ENCOUNTER — Ambulatory Visit: Payer: Managed Care, Other (non HMO)

## 2019-06-19 ENCOUNTER — Telehealth: Payer: Self-pay

## 2019-06-19 DIAGNOSIS — I10 Essential (primary) hypertension: Secondary | ICD-10-CM | POA: Diagnosis not present

## 2019-06-19 DIAGNOSIS — E119 Type 2 diabetes mellitus without complications: Secondary | ICD-10-CM | POA: Diagnosis not present

## 2019-06-19 LAB — BASIC METABOLIC PANEL
BUN: 21 mg/dL (ref 6–23)
CO2: 29 mEq/L (ref 19–32)
Calcium: 9.8 mg/dL (ref 8.4–10.5)
Chloride: 101 mEq/L (ref 96–112)
Creatinine, Ser: 0.73 mg/dL (ref 0.40–1.20)
GFR: 82.92 mL/min (ref >60.00)
Glucose, Bld: 175 mg/dL — ABNORMAL HIGH (ref 70–99)
Potassium: 4.4 mEq/L (ref 3.5–5.1)
Sodium: 136 mEq/L (ref 135–145)

## 2019-06-19 LAB — HEMOGLOBIN A1C: Hgb A1c MFr Bld: 8.1 % — ABNORMAL HIGH (ref 4.6–6.5)

## 2019-06-19 NOTE — Telephone Encounter (Signed)
I received a fax stating the current manufacturer for levothyroxine is unavailable  and they wanted to switch to a different manufacturer, I called the patient and she stated it was ok to chang and I called to [facility] and informed them it was ok to change the manufacturer.  Brenda Farmer,cma

## 2019-07-03 ENCOUNTER — Other Ambulatory Visit: Payer: Self-pay | Admitting: Family Medicine

## 2019-07-03 MED ORDER — TRULICITY 1.5 MG/0.5ML ~~LOC~~ SOAJ: 1.5000 mg | SUBCUTANEOUS | 1 refills | Status: DC

## 2019-07-06 ENCOUNTER — Other Ambulatory Visit: Payer: Self-pay | Admitting: Family

## 2019-07-20 ENCOUNTER — Encounter: Payer: Self-pay | Admitting: Family Medicine

## 2019-07-23 ENCOUNTER — Other Ambulatory Visit: Payer: Self-pay

## 2019-07-23 DIAGNOSIS — E782 Mixed hyperlipidemia: Secondary | ICD-10-CM

## 2019-07-23 DIAGNOSIS — E039 Hypothyroidism, unspecified: Secondary | ICD-10-CM

## 2019-07-23 DIAGNOSIS — I10 Essential (primary) hypertension: Secondary | ICD-10-CM

## 2019-07-23 DIAGNOSIS — R059 Cough, unspecified: Secondary | ICD-10-CM

## 2019-07-23 DIAGNOSIS — R05 Cough: Secondary | ICD-10-CM

## 2019-07-23 DIAGNOSIS — M542 Cervicalgia: Secondary | ICD-10-CM

## 2019-07-23 DIAGNOSIS — E119 Type 2 diabetes mellitus without complications: Secondary | ICD-10-CM

## 2019-07-23 MED ORDER — ROSUVASTATIN CALCIUM 40 MG PO TABS: 40.0000 mg | ORAL_TABLET | Freq: Every day | ORAL | 0 refills | Status: DC

## 2019-07-23 MED ORDER — METFORMIN HCL 500 MG PO TABS: 500.0000 mg | ORAL_TABLET | Freq: Two times a day (BID) | ORAL | 3 refills | Status: DC

## 2019-07-23 MED ORDER — TRULICITY 1.5 MG/0.5ML ~~LOC~~ SOAJ: 1.5000 mg | SUBCUTANEOUS | 1 refills | Status: DC

## 2019-07-23 MED ORDER — GLIMEPIRIDE 4 MG PO TABS: ORAL_TABLET | ORAL | 0 refills | Status: DC

## 2019-07-23 MED ORDER — HYDROCHLOROTHIAZIDE 25 MG PO TABS: 25.0000 mg | ORAL_TABLET | Freq: Every day | ORAL | 0 refills | Status: DC

## 2019-07-23 MED ORDER — LISINOPRIL 20 MG PO TABS: 20.0000 mg | ORAL_TABLET | Freq: Every day | ORAL | 0 refills | Status: DC

## 2019-07-23 MED ORDER — EZETIMIBE 10 MG PO TABS: 10.0000 mg | ORAL_TABLET | Freq: Every day | ORAL | 3 refills | Status: DC

## 2019-07-23 MED ORDER — JARDIANCE 25 MG PO TABS: 25.0000 mg | ORAL_TABLET | Freq: Every day | ORAL | 1 refills | Status: DC

## 2019-07-23 MED ORDER — ALBUTEROL SULFATE HFA 108 (90 BASE) MCG/ACT IN AERS: 1.0000 | INHALATION_SPRAY | Freq: Four times a day (QID) | RESPIRATORY_TRACT | 0 refills | Status: DC | PRN

## 2019-07-23 MED ORDER — LEVOTHYROXINE SODIUM 125 MCG PO TABS: 125.0000 ug | ORAL_TABLET | Freq: Every day | ORAL | 3 refills | Status: DC

## 2019-07-23 MED ORDER — DICLOFENAC SODIUM 75 MG PO TBEC: DELAYED_RELEASE_TABLET | ORAL | 0 refills | Status: DC

## 2019-07-23 NOTE — Progress Notes (Signed)
Medication sent to new pharmacy per provider and patient request.  Merriel Zinger,cma

## 2019-09-29 ENCOUNTER — Other Ambulatory Visit: Payer: Self-pay | Admitting: Family Medicine

## 2019-09-29 DIAGNOSIS — I10 Essential (primary) hypertension: Secondary | ICD-10-CM

## 2019-10-03 ENCOUNTER — Other Ambulatory Visit: Payer: Self-pay | Admitting: Family Medicine

## 2019-10-03 DIAGNOSIS — M542 Cervicalgia: Secondary | ICD-10-CM

## 2019-10-03 DIAGNOSIS — E782 Mixed hyperlipidemia: Secondary | ICD-10-CM

## 2019-10-03 DIAGNOSIS — E119 Type 2 diabetes mellitus without complications: Secondary | ICD-10-CM

## 2019-10-10 ENCOUNTER — Encounter: Payer: Self-pay | Admitting: Family Medicine

## 2019-10-10 ENCOUNTER — Other Ambulatory Visit: Payer: Self-pay

## 2019-10-10 ENCOUNTER — Ambulatory Visit (INDEPENDENT_AMBULATORY_CARE_PROVIDER_SITE_OTHER): Payer: BC Managed Care – PPO | Admitting: Family Medicine

## 2019-10-10 VITALS — BP 130/70 | HR 89 | Temp 95.9°F | Ht 58.5 in | Wt 162.8 lb

## 2019-10-10 DIAGNOSIS — Z720 Tobacco use: Secondary | ICD-10-CM

## 2019-10-10 DIAGNOSIS — E669 Obesity, unspecified: Secondary | ICD-10-CM | POA: Diagnosis not present

## 2019-10-10 DIAGNOSIS — E039 Hypothyroidism, unspecified: Secondary | ICD-10-CM

## 2019-10-10 DIAGNOSIS — I10 Essential (primary) hypertension: Secondary | ICD-10-CM | POA: Diagnosis not present

## 2019-10-10 DIAGNOSIS — E119 Type 2 diabetes mellitus without complications: Secondary | ICD-10-CM

## 2019-10-10 LAB — TSH: TSH: 0.76 u[IU]/mL (ref 0.35–4.50)

## 2019-10-10 LAB — BASIC METABOLIC PANEL
BUN: 19 mg/dL (ref 6–23)
CO2: 28 mEq/L (ref 19–32)
Calcium: 10.1 mg/dL (ref 8.4–10.5)
Chloride: 104 mEq/L (ref 96–112)
Creatinine, Ser: 0.65 mg/dL (ref 0.40–1.20)
GFR: 94.7 mL/min (ref >60.00)
Glucose, Bld: 107 mg/dL — ABNORMAL HIGH (ref 70–99)
Potassium: 4.4 mEq/L (ref 3.5–5.1)
Sodium: 136 mEq/L (ref 135–145)

## 2019-10-10 LAB — HEMOGLOBIN A1C: Hgb A1c MFr Bld: 7.2 % — ABNORMAL HIGH (ref 4.6–6.5)

## 2019-10-10 NOTE — Assessment & Plan Note (Addendum)
Seems to be well controlled.  Check A1c.  Continue current medication.

## 2019-10-10 NOTE — Assessment & Plan Note (Signed)
Check TSH 

## 2019-10-10 NOTE — Assessment & Plan Note (Signed)
At goal today.  Continue current regimen.  Check labs.

## 2019-10-10 NOTE — Assessment & Plan Note (Signed)
Encouraged continued dietary changes.  Encouraged to be active over the summer.

## 2019-10-10 NOTE — Assessment & Plan Note (Signed)
Advised to quit smoking °

## 2019-10-10 NOTE — Progress Notes (Signed)
  Tommi Rumps, MD Phone: [phone]  Brenda Farmer is a 54 y.o. female who presents today for f/u.  HYPERTENSION  Disease Monitoring  Home BP Monitoring not checking Chest pain- no    Dyspnea- no Medications  Compliance-  Taking HCTZ, lisinopril.  Edema- no  DIABETES Disease Monitoring: Blood Sugar ranges-130-140 am, 120-130 2 hr post prandial in the afternoon Polyuria/phagia/dipsia- no      Optho- due soon Medications: Compliance- taking jardiance, trulicity, glimeperide, metformin Hypoglycemic symptoms- no  Tobacco abuse: Patient is working on quitting.  She is cut down to 1/4 pack a day.  Patient not quite ready to quit though is working on it.  Obesity: Patient has continued to lose weight.  She is worked on cutting down on portion sizes and decreasing carbs.  No fried foods.  She is try to stay active and is going to be in the pool this summer.  Social History   Tobacco Use  Smoking Status Current Every Day Smoker  . Packs/day: 0.50  . Types: Cigarettes  Smokeless Tobacco Never Used  Tobacco Comment   working on quitting     ROS see history of present illness  Objective  Physical Exam Vitals:   10/10/19 0806  BP: 130/70  Pulse: 89  Temp: (!) 95.9 F (35.5 C)  SpO2: 94%    BP Readings from Last 3 Encounters:  10/10/19 130/70  06/11/19 110/70  02/28/19 118/70   Wt Readings from Last 3 Encounters:  10/10/19 162 lb 12.8 oz (73.8 kg)  06/11/19 167 lb 3.2 oz (75.8 kg)  02/28/19 172 lb 3.2 oz (78.1 kg)    Physical Exam Constitutional:      General: She is not in acute distress.    Appearance: She is not diaphoretic.  Cardiovascular:     Rate and Rhythm: Normal rate and regular rhythm.     Heart sounds: Normal heart sounds.  Pulmonary:     Effort: Pulmonary effort is normal.     Breath sounds: Normal breath sounds.  Musculoskeletal:     Right lower leg: No edema.     Left lower leg: No edema.  Skin:    General: Skin is warm and dry.    Neurological:     Mental Status: She is alert.      Assessment/Plan: Please see individual problem list.  Benign essential HTN At goal today.  Continue current regimen.  Check labs.  Controlled type 2 diabetes mellitus without complication (HCC) Seems to be well controlled.  Check A1c.  Continue current medication.  Hypothyroidism Check TSH.  Obesity (BMI 30.0-34.9) Encouraged continued dietary changes.  Encouraged to be active over the summer.  Tobacco abuse Advised to quit smoking.   Encouraged to see ophthalmology when she is due.  Orders Placed This Encounter  Procedures  . TSH  . HgB A1c  . Basic Metabolic Panel (BMET)    No orders of the defined types were placed in this encounter.   This visit occurred during the SARS-CoV-2 public health emergency.  Safety protocols were in place, including screening questions prior to the visit, additional usage of staff PPE, and extensive cleaning of exam room while observing appropriate contact time as indicated for disinfecting solutions.    Tommi Rumps, MD [place]

## 2019-10-10 NOTE — Patient Instructions (Signed)
Nice to see you. We will check lab work today and contact you with the results. 

## 2019-10-28 ENCOUNTER — Ambulatory Visit: Payer: BC Managed Care – PPO | Admitting: Family Medicine

## 2019-10-28 ENCOUNTER — Encounter: Payer: Self-pay | Admitting: Family Medicine

## 2019-10-28 ENCOUNTER — Other Ambulatory Visit: Payer: Self-pay

## 2019-10-28 DIAGNOSIS — R202 Paresthesia of skin: Secondary | ICD-10-CM

## 2019-10-28 DIAGNOSIS — R200 Anesthesia of skin: Secondary | ICD-10-CM

## 2019-10-28 MED ORDER — PREDNISONE 20 MG PO TABS
40.0000 mg | ORAL_TABLET | Freq: Every day | ORAL | 0 refills | Status: DC
Start: 2019-10-28 — End: 2020-09-23

## 2019-10-28 NOTE — Progress Notes (Addendum)
  Tommi Rumps, MD Phone: [phone]  Brenda Farmer is a 54 y.o. female who presents today for same-day visit.  Radiculopathy: Patient notes this has been an ongoing issue in her right leg. Has had symptoms like this for several years. Notes it is worsened recently.  She has persistent numb sensation in her right lateral thigh.  It will go completely numb in that area if she stands or walks for more than 15 minutes.  She will trouble walking due to the numbness.  No back pain.  No incontinence.  No weakness.  Social History   Tobacco Use  Smoking Status Current Every Day Smoker  . Packs/day: 0.50  . Types: Cigarettes  Smokeless Tobacco Never Used  Tobacco Comment   working on quitting     ROS see history of present illness  Objective  Physical Exam Vitals:   10/28/19 1157  BP: 115/60  Pulse: 95  Temp: (!) 97.3 F (36.3 C)  SpO2: 95%    BP Readings from Last 3 Encounters:  10/28/19 115/60  10/10/19 130/70  06/11/19 110/70   Wt Readings from Last 3 Encounters:  10/28/19 162 lb 6.4 oz (73.7 kg)  10/10/19 162 lb 12.8 oz (73.8 kg)  06/11/19 167 lb 3.2 oz (75.8 kg)    Physical Exam Musculoskeletal:     Comments: No midline spine tenderness, no midline spine step-off, no muscular back tenderness  Neurological:     Comments: 5/5 strength bilateral quads, hamstring, plantar flexion, and dorsiflexion, sensation light touch slightly decreased over her right lateral and anterior thigh, intact light touch sensation in the rest of her bilateral lower extremities, 2+ patellar reflexes      Assessment/Plan: Please see individual problem list.  Numbness and tingling of right distal lateral thigh Chronic issue.  Worsened recently.  Will treat with a course of prednisone.  MRI ordered.  Given reasons to seek medical attention including bowel or bladder incontinence, worsening numbness, or weakness.  Letter with work restrictions given to the patient.   Orders  Placed This Encounter  Procedures  . MR Lumbar Spine Wo Contrast    Standing Status:   Future    Standing Expiration Date:   10/27/2020    Order Specific Question:   What is the patient's sedation requirement?    Answer:   No Sedation    Order Specific Question:   Does the patient have a pacemaker or implanted devices?    Answer:   No    Order Specific Question:   Preferred imaging location?    Answer:   [facility] (table limit - [phone])    Order Specific Question:   Radiology Contrast Protocol - do NOT remove file path    Answer:   \\[email or web address]    Meds ordered this encounter  Medications  . predniSONE (DELTASONE) 20 MG tablet    Sig: Take 2 tablets (40 mg total) by mouth daily with breakfast.    Dispense:  10 tablet    Refill:  0    This visit occurred during the SARS-CoV-2 public health emergency.  Safety protocols were in place, including screening questions prior to the visit, additional usage of staff PPE, and extensive cleaning of exam room while observing appropriate contact time as indicated for disinfecting solutions.    Tommi Rumps, MD [place]

## 2019-10-28 NOTE — Assessment & Plan Note (Signed)
Chronic issue.  Worsened recently.  Will treat with a course of prednisone.  MRI ordered.  Given reasons to seek medical attention including bowel or bladder incontinence, worsening numbness, or weakness.

## 2019-10-28 NOTE — Patient Instructions (Signed)
Nice to see you. We will get an MRI ordered. We will start you on prednisone to help with the symptoms. If you develop worsening numbness, or develop weakness or bowel or bladder incontinence please seek medical attention in the emergency room.

## 2019-11-04 ENCOUNTER — Telehealth: Payer: Self-pay | Admitting: Family Medicine

## 2019-11-04 NOTE — Telephone Encounter (Signed)
Left vm for pt to call ofc to sch MRI

## 2019-11-04 NOTE — Telephone Encounter (Signed)
Pt returned your call.  

## 2019-11-05 ENCOUNTER — Telehealth: Payer: Self-pay | Admitting: Family Medicine

## 2019-11-05 NOTE — Telephone Encounter (Signed)
Pt was returning call from yesterday

## 2019-11-05 NOTE — Telephone Encounter (Signed)
Ok thanks 

## 2019-11-19 ENCOUNTER — Ambulatory Visit
Admission: RE | Admit: 2019-11-19 | Discharge: 2019-11-19 | Disposition: A | Discharge status: Home / Self Care | Payer: BC Managed Care – PPO | Source: Ambulatory Visit | Attending: Family Medicine | Admitting: Family Medicine

## 2019-11-19 ENCOUNTER — Other Ambulatory Visit: Payer: Self-pay

## 2019-11-19 DIAGNOSIS — R200 Anesthesia of skin: Secondary | ICD-10-CM

## 2019-11-19 DIAGNOSIS — R202 Paresthesia of skin: Secondary | ICD-10-CM | POA: Diagnosis present

## 2019-11-21 ENCOUNTER — Other Ambulatory Visit: Payer: Self-pay | Admitting: Family Medicine

## 2019-11-21 DIAGNOSIS — M5136 Other intervertebral disc degeneration, lumbar region: Secondary | ICD-10-CM

## 2019-12-01 ENCOUNTER — Other Ambulatory Visit: Payer: Self-pay

## 2019-12-01 ENCOUNTER — Ambulatory Visit: Payer: BC Managed Care – PPO | Admitting: Family Medicine

## 2019-12-01 ENCOUNTER — Encounter: Payer: Self-pay | Admitting: Family Medicine

## 2019-12-01 VITALS — BP 120/80 | HR 98 | Temp 98.3°F | Ht 58.5 in | Wt 161.4 lb

## 2019-12-01 DIAGNOSIS — R232 Flushing: Secondary | ICD-10-CM

## 2019-12-01 DIAGNOSIS — R202 Paresthesia of skin: Secondary | ICD-10-CM

## 2019-12-01 DIAGNOSIS — R200 Anesthesia of skin: Secondary | ICD-10-CM

## 2019-12-01 DIAGNOSIS — F321 Major depressive disorder, single episode, moderate: Secondary | ICD-10-CM | POA: Insufficient documentation

## 2019-12-01 DIAGNOSIS — F39 Unspecified mood [affective] disorder: Secondary | ICD-10-CM | POA: Insufficient documentation

## 2019-12-01 NOTE — Assessment & Plan Note (Signed)
>>  ASSESSMENT AND PLAN FOR DEPRESSION, MAJOR, SINGLE EPISODE, MODERATE (HCC) WRITTEN ON 12/01/2019  4:17 PM BY MARIBETH, ERIC G, MD  New issue.  She notes she does not really have anything to be depressed about.  Could be related to postmenopausal status.  We are checking an [facility].  Discussed the potential for starting an antidepressant though she would like to wait until her lab results return.  Advised to seek medical attention in the emergency room if she develops active suicidal thoughts.

## 2019-12-01 NOTE — Assessment & Plan Note (Signed)
Stable.  Previously referred to neurosurgery.  We will send a message to our referral coordinator to check on the referral for her.

## 2019-12-01 NOTE — Progress Notes (Signed)
Tommi Rumps, MD Phone: [phone]  Brenda Farmer is a 54 y.o. female who presents today for f/u.  Depression/menopause: Patient notes for the last several months she has not felt like herself.  She does not want to be around anybody or do anything.  She does have occasional thoughts that people would be better off without her being around though she has no active suicidal thoughts.  She is status post hysterectomy and has not had menses related to that.  She did previously have some hot flashes.  She notes she described how she feels to her mom and her mom noted it was related to low estrogen.  Lumbar radiculopathy: Patient notes her symptoms are stable.  She took the prednisone and there is no much benefit.  MRI did reveal severe spinal stenosis.  She has not seen neurosurgery yet.  No incontinence.  Occasionally notes some right hip flexion decreased strength.  Social History   Tobacco Use  Smoking Status Current Every Day Smoker  . Packs/day: 0.50  . Types: Cigarettes  Smokeless Tobacco Never Used  Tobacco Comment   working on quitting     ROS see history of present illness  Objective  Physical Exam Vitals:   12/01/19 1545  BP: 120/80  Pulse: 98  Temp: 98.3 F (36.8 C)  SpO2: 95%    BP Readings from Last 3 Encounters:  12/01/19 120/80  10/28/19 115/60  10/10/19 130/70   Wt Readings from Last 3 Encounters:  12/01/19 161 lb 6.4 oz (73.2 kg)  10/28/19 162 lb 6.4 oz (73.7 kg)  10/10/19 162 lb 12.8 oz (73.8 kg)    Physical Exam Constitutional:      General: She is not in acute distress.    Appearance: She is not diaphoretic.  Cardiovascular:     Rate and Rhythm: Normal rate and regular rhythm.     Heart sounds: Normal heart sounds.  Pulmonary:     Effort: Pulmonary effort is normal.     Breath sounds: Normal breath sounds.  Musculoskeletal:     Comments: 5/5 strength bilateral quads, hamstrings, plantar flexion, dorsiflexion, and hip flexion,  sensation to light touch intact bilateral lower extremities  Skin:    General: Skin is warm and dry.  Neurological:     Mental Status: She is alert.      Assessment/Plan: Please see individual problem list.  Numbness and tingling of right distal lateral thigh Stable.  Previously referred to neurosurgery.  We will send a message to our referral coordinator to check on the referral for her.  Hot flashes Patient had these previously.  She may be postmenopausal and lack of estrogen may be contributing to her depression symptoms.  We will check an [place].  I did discuss that her smoking status may limit our ability to treat her with hormone replacement.  Depression, major, single episode, moderate ([place]) New issue.  She notes she does not really have anything to be depressed about.  Could be related to postmenopausal status.  We are checking an [place].  Discussed the potential for starting an antidepressant though she would like to wait until her lab results return.  Advised to seek medical attention in the emergency room if she develops active suicidal thoughts.   Orders Placed This Encounter  Procedures  . [place]    No orders of the defined types were placed in this encounter.   This visit occurred during the SARS-CoV-2 public health emergency.  Safety protocols were in place, including screening  questions prior to the visit, additional usage of staff PPE, and extensive cleaning of exam room while observing appropriate contact time as indicated for disinfecting solutions.    I have spent 36 minutes in the care of this patient regarding history taking, review of her MRI report, review of work-up and treatment strategies for postmenopausal symptoms and depression, documentation, and placing orders.   Tommi Rumps, MD [place]

## 2019-12-01 NOTE — Patient Instructions (Signed)
Nice to see you. We will check an [facility] today.  Once this returns we will make a determination on the next step in management.  Your smoking status will likely limit what we can do for hormone replacement.

## 2019-12-01 NOTE — Assessment & Plan Note (Addendum)
New issue.  She notes she does not really have anything to be depressed about.  Could be related to postmenopausal status.  We are checking an [place].  Discussed the potential for starting an antidepressant though she would like to wait until her lab results return.  Advised to seek medical attention in the emergency room if she develops active suicidal thoughts.

## 2019-12-01 NOTE — Assessment & Plan Note (Signed)
Patient had these previously.  She may be postmenopausal and lack of estrogen may be contributing to her depression symptoms.  We will check an [place].  I did discuss that her smoking status may limit our ability to treat her with hormone replacement.

## 2019-12-02 LAB — FOLLICLE STIMULATING HORMONE: FSH: 64 m[IU]/mL

## 2019-12-08 ENCOUNTER — Telehealth: Payer: Self-pay | Admitting: Family Medicine

## 2019-12-08 NOTE — Telephone Encounter (Signed)
This patient is calling about her labs from 12/02/2019, I saw where you asked Catie about it and she did not respond I will call the patient and inform her that I will get back with her tomorrow.  Khira Cudmore,cma

## 2019-12-08 NOTE — Telephone Encounter (Signed)
Patient called about her 12/02/2019 lab results. She stated no one called her with results. She also stated no has called her about her referral.

## 2019-12-10 NOTE — Telephone Encounter (Signed)
Please let the patient know that I heard back from the clinical pharmacist. We both feel that hormone replacement would place her at increased risk for stroke, heart attack, and blood clots and she is already at an increased risk due to her smoking. The safest option would be to treat with an antidepressant to help with her symptoms and see if that is beneficial. If this does not provide a good benefit after an adequate trial we could consider hormone replacement though she would have to be willing to accept the risks listed above. I can send in an antidepressant once you speak with her. She will need to follow-up in 4-6 weeks after starting the medication.   Message below received from clinical pharmacist.   "Hey,   It appears to be more of a relative contraindication, related to her overall ASCVD risk.   I calculated 10 year ASCVD risk (fortunately/unfortunately, her lipids are so well controlled that I cannot calculate a true score, I had to plug in the minimum LDL of 70 and total of 130). With her smoking status, her ASCVD risk of 11.8%. Again though, hers is technically a bit lower because of her better cholesterol (however, better cholesterol is not necessarily protective for the CV/VTE risk of HRT).   Additionally, per her documented family history, she does have a family history of heart disease/strokes.   My recommendation would be to explain to the patient that HRT increases her risk of heart disease, strokes, and VTE, and she already has an increased risk d/t tobacco use. The safest option in light of CV risk would be to try a SSRI/SNRI first for postmenopausal symptoms. If this does not provide desired benefit (in an appropriate trial of 6-8 weeks) or if patient is not amenable to antidepressant and is willing to accept the increased risk of CV/VTE, then low dose transdermal HRT would be my recommendation.   Catie"

## 2019-12-10 NOTE — Telephone Encounter (Signed)
I called the patient and informed her that the provider does not recommend Estoven supplement and the patient stated she is okay with the antidepressant and she scheduled to see you in 6 weeks.  You can send the RX to [facility] on [address].  Viktorya Arguijo,cma

## 2019-12-10 NOTE — Telephone Encounter (Signed)
Pt called returning your call 

## 2019-12-10 NOTE — Telephone Encounter (Signed)
LVM for the patient to cal back.  Mazen Marcin,cma

## 2019-12-10 NOTE — Telephone Encounter (Signed)
Hoy Register appears to be a supplement. I typically do not recommend supplements as they are not regulated and typically do not have data on how they will interact with other medications. I can not find any safety data or any data indicating that this type of supplement would be beneficial, so I would not recommend it.

## 2019-12-10 NOTE — Telephone Encounter (Signed)
I called and spoke with the patient and informed her of what the pharmacist and provider said, she understood but wants to know  what about estroven, a medication her mother and sister takes she wants to know if that could be an option. Also she stated she still has not heard from he specialist you referred her to a spine specialist.  Levell Tavano,cma

## 2019-12-12 MED ORDER — ESCITALOPRAM OXALATE 10 MG PO TABS
10.0000 mg | ORAL_TABLET | Freq: Every day | ORAL | 1 refills | Status: DC
Start: 2019-12-12 — End: 2020-01-08

## 2019-12-12 NOTE — Addendum Note (Signed)
Addended by: Leone Haven on: 12/12/2019 03:20 PM   Modules accepted: Orders

## 2019-12-12 NOTE — Telephone Encounter (Signed)
Lexapro sent to pharmacy. 

## 2019-12-24 ENCOUNTER — Other Ambulatory Visit: Payer: Self-pay | Admitting: Family Medicine

## 2019-12-24 DIAGNOSIS — E119 Type 2 diabetes mellitus without complications: Secondary | ICD-10-CM

## 2020-01-01 ENCOUNTER — Other Ambulatory Visit: Payer: Self-pay | Admitting: Family Medicine

## 2020-01-01 DIAGNOSIS — E119 Type 2 diabetes mellitus without complications: Secondary | ICD-10-CM

## 2020-01-08 ENCOUNTER — Other Ambulatory Visit: Payer: Self-pay

## 2020-01-08 MED ORDER — ESCITALOPRAM OXALATE 10 MG PO TABS: 10.0000 mg | ORAL_TABLET | Freq: Every day | ORAL | 1 refills | Status: DC

## 2020-01-08 NOTE — Telephone Encounter (Signed)
Sent to pharmacy 

## 2020-01-20 ENCOUNTER — Ambulatory Visit: Payer: BC Managed Care – PPO | Admitting: Family Medicine

## 2020-02-13 ENCOUNTER — Other Ambulatory Visit: Payer: Self-pay

## 2020-02-13 ENCOUNTER — Ambulatory Visit: Payer: BC Managed Care – PPO | Admitting: Family Medicine

## 2020-02-13 ENCOUNTER — Encounter: Payer: Self-pay | Admitting: Family Medicine

## 2020-02-13 VITALS — BP 122/70 | HR 82 | Temp 98.4°F | Ht 58.5 in | Wt 159.0 lb

## 2020-02-13 DIAGNOSIS — R200 Anesthesia of skin: Secondary | ICD-10-CM

## 2020-02-13 DIAGNOSIS — F321 Major depressive disorder, single episode, moderate: Secondary | ICD-10-CM | POA: Diagnosis not present

## 2020-02-13 DIAGNOSIS — Z720 Tobacco use: Secondary | ICD-10-CM

## 2020-02-13 DIAGNOSIS — I10 Essential (primary) hypertension: Secondary | ICD-10-CM | POA: Diagnosis not present

## 2020-02-13 DIAGNOSIS — Z23 Encounter for immunization: Secondary | ICD-10-CM

## 2020-02-13 DIAGNOSIS — Z1231 Encounter for screening mammogram for malignant neoplasm of breast: Secondary | ICD-10-CM

## 2020-02-13 DIAGNOSIS — E119 Type 2 diabetes mellitus without complications: Secondary | ICD-10-CM | POA: Diagnosis not present

## 2020-02-13 DIAGNOSIS — R202 Paresthesia of skin: Secondary | ICD-10-CM

## 2020-02-13 NOTE — Patient Instructions (Signed)
Nice to see you. Please call to schedule your mammogram. We will contact you with your lab results.

## 2020-02-13 NOTE — Assessment & Plan Note (Signed)
Much improved.  Continue Lexapro 10 mg once daily.

## 2020-02-13 NOTE — Progress Notes (Signed)
Tommi Rumps, MD Phone: [phone]  MATSUKO KRETZ is a 54 y.o. female who presents today for f/u.  HYPERTENSION  Disease Monitoring  Home BP Monitoring not checking Chest pain- no    Dyspnea- no Medications  Compliance-  Taking HCTZ, lisinopril  Edema- no  DIABETES Disease Monitoring: Blood Sugar ranges-150 fasting, 120 1 hour post prandial Polyuria/phagia/dipsia- no      Optho- due Medications: Compliance- taking trulicity, jardiance, glimeperide, metformin Hypoglycemic symptoms- occasional, gets jittery and eats something and improves.  Has been exercising and eating smaller portions.   Depression: no symptoms of depression at this time. No SI. lexapro has been very beneficial.   Tobacco abuse: she is completing a 6 week smoking cessation program through work.   Right leg pain/numbness: Patient saw neurosurgery and they were concerned that the MRI did not explain her symptoms fully.  They referred her to orthopedics and she had an injection for possible meralgia paresthetica.  She notes the injection did not help.  She is done physical therapy exercises with no significant improvement either.  At this point she would like to monitor this given that it is stable and ongoing.     Social History   Tobacco Use  Smoking Status Current Every Day Smoker  . Packs/day: 0.50  . Types: Cigarettes  Smokeless Tobacco Never Used  Tobacco Comment   working on quitting     ROS see history of present illness  Objective  Physical Exam Vitals:   02/13/20 1411  BP: 122/70  Pulse: 82  Temp: 98.4 F (36.9 C)  SpO2: 99%    BP Readings from Last 3 Encounters:  02/13/20 122/70  12/01/19 120/80  10/28/19 115/60   Wt Readings from Last 3 Encounters:  02/13/20 159 lb (72.1 kg)  12/01/19 161 lb 6.4 oz (73.2 kg)  10/28/19 162 lb 6.4 oz (73.7 kg)    Physical Exam Constitutional:      General: She is not in acute distress.    Appearance: She is not diaphoretic.    Cardiovascular:     Rate and Rhythm: Normal rate and regular rhythm.     Heart sounds: Normal heart sounds.  Pulmonary:     Effort: Pulmonary effort is normal.     Breath sounds: Normal breath sounds.  Musculoskeletal:     Right lower leg: No edema.     Left lower leg: No edema.  Skin:    General: Skin is warm and dry.  Neurological:     Mental Status: She is alert.      Assessment/Plan: Please see individual problem list.  Depression, major, single episode, moderate (HCC) Much improved.  Continue Lexapro 10 mg once daily.  Controlled type 2 diabetes mellitus without complication (HCC) Check A1c.  Continue Trulicity 1.5 mg once daily, metformin 500 mg twice daily, Jardiance 25 mg once daily, and glimepiride 4 mg once daily.  Benign essential HTN Well-controlled.  Continue HCTZ 25 mg once daily and lisinopril 20 mg once daily.  Tobacco abuse She will continue with her counseling for this through work.  Numbness and tingling of right distal lateral thigh Chronic issue.  Has been evaluated by neurosurgery and orthopedics.  Interventions thus far been unremarkable.  She prefers to monitor at this time.  If worsening she will let us know.   Health Maintenance: She will call to schedule her mammogram.  Orders Placed This Encounter  Procedures  . MM 3D SCREEN BREAST BILATERAL    Standing Status:   Future  Standing Expiration Date:   02/12/2021    Order Specific Question:   Reason for Exam (SYMPTOM  OR DIAGNOSIS REQUIRED)    Answer:   Breast cancer screening    Order Specific Question:   Is the patient pregnant?    Answer:   No    Order Specific Question:   Preferred imaging location?    Answer:   [facility]  . Flu Vaccine QUAD 36+ mos IM  . Comp Met (CMET)  . Lipid panel  . HgB A1c    No orders of the defined types were placed in this encounter.   Antaniya was seen today for follow-up.  Diagnoses and all orders for this visit:  Depression, major,  single episode, moderate ([place])  Need for immunization against influenza -     Flu Vaccine QUAD 36+ mos IM  Controlled type 2 diabetes mellitus without complication, without long-term current use of insulin (HCC) -     HgB A1c  Benign essential HTN -     Comp Met (CMET) -     Lipid panel  Tobacco abuse  Numbness and tingling of right distal lateral thigh  Encounter for screening mammogram for malignant neoplasm of breast -     MM 3D SCREEN BREAST BILATERAL; Future     This visit occurred during the SARS-CoV-2 public health emergency.  Safety protocols were in place, including screening questions prior to the visit, additional usage of staff PPE, and extensive cleaning of exam room while observing appropriate contact time as indicated for disinfecting solutions.    Tommi Rumps, MD [place]

## 2020-02-13 NOTE — Assessment & Plan Note (Signed)
>>  ASSESSMENT AND PLAN FOR DEPRESSION, MAJOR, SINGLE EPISODE, MODERATE (HCC) WRITTEN ON 02/13/2020  2:33 PM BY SONNENBERG, ERIC G, MD  Much improved.  Continue Lexapro  10 mg once daily.

## 2020-02-13 NOTE — Assessment & Plan Note (Signed)
Check A1c.  Continue Trulicity 1.5 mg once daily, metformin 500 mg twice daily, Jardiance 25 mg once daily, and glimepiride 4 mg once daily.

## 2020-02-13 NOTE — Assessment & Plan Note (Signed)
She will continue with her counseling for this through work.

## 2020-02-13 NOTE — Assessment & Plan Note (Signed)
Well-controlled.  Continue HCTZ 25 mg once daily and lisinopril 20 mg once daily.

## 2020-02-13 NOTE — Assessment & Plan Note (Signed)
Chronic issue.  Has been evaluated by neurosurgery and orthopedics.  Interventions thus far been unremarkable.  She prefers to monitor at this time.  If worsening she will let us know.

## 2020-02-14 LAB — LIPID PANEL
Cholesterol: 87 mg/dL (ref <200)
HDL: 36 mg/dL — ABNORMAL LOW (ref >=50)
LDL Cholesterol (Calc): 31 mg/dL (calc)
Non-HDL Cholesterol (Calc): 51 mg/dL (calc) (ref <130)
Total CHOL/HDL Ratio: 2.4 (calc) (ref <5.0)
Triglycerides: 116 mg/dL (ref <150)

## 2020-02-14 LAB — COMPREHENSIVE METABOLIC PANEL
AG Ratio: 2.2 (calc) (ref 1.0–2.5)
ALT: 45 U/L — ABNORMAL HIGH (ref 6–29)
AST: 23 U/L (ref 10–35)
Albumin: 4.4 g/dL (ref 3.6–5.1)
Alkaline phosphatase (APISO): 57 U/L (ref 37–153)
BUN: 24 mg/dL (ref 7–25)
CO2: 28 mmol/L (ref 20–32)
Calcium: 10 mg/dL (ref 8.6–10.4)
Chloride: 102 mmol/L (ref 98–110)
Creat: 0.76 mg/dL (ref 0.50–1.05)
Globulin: 2 g/dL (calc) (ref 1.9–3.7)
Glucose, Bld: 127 mg/dL — ABNORMAL HIGH (ref 65–99)
Potassium: 4.1 mmol/L (ref 3.5–5.3)
Sodium: 139 mmol/L (ref 135–146)
Total Bilirubin: 0.3 mg/dL (ref 0.2–1.2)
Total Protein: 6.4 g/dL (ref 6.1–8.1)

## 2020-02-14 LAB — HEMOGLOBIN A1C
Hgb A1c MFr Bld: 6.9 % of total Hgb — ABNORMAL HIGH (ref <5.7)
Mean Plasma Glucose: 151 (calc)
eAG (mmol/L): 8.4 (calc)

## 2020-03-02 ENCOUNTER — Other Ambulatory Visit: Payer: Self-pay | Admitting: Family Medicine

## 2020-03-02 DIAGNOSIS — K760 Fatty (change of) liver, not elsewhere classified: Secondary | ICD-10-CM

## 2020-03-18 LAB — HM DIABETES EYE EXAM

## 2020-03-31 ENCOUNTER — Other Ambulatory Visit: Payer: Self-pay | Admitting: Family Medicine

## 2020-03-31 DIAGNOSIS — R059 Cough, unspecified: Secondary | ICD-10-CM

## 2020-04-05 ENCOUNTER — Ambulatory Visit
Admission: RE | Admit: 2020-04-05 | Discharge: 2020-04-05 | Disposition: A | Discharge status: Home / Self Care | Payer: BC Managed Care – PPO | Source: Ambulatory Visit | Attending: Family Medicine | Admitting: Family Medicine

## 2020-04-05 ENCOUNTER — Other Ambulatory Visit: Payer: Self-pay

## 2020-04-05 DIAGNOSIS — Z1231 Encounter for screening mammogram for malignant neoplasm of breast: Secondary | ICD-10-CM

## 2020-06-17 ENCOUNTER — Other Ambulatory Visit: Payer: Self-pay | Admitting: Gastroenterology

## 2020-06-17 DIAGNOSIS — K760 Fatty (change of) liver, not elsewhere classified: Secondary | ICD-10-CM

## 2020-06-18 ENCOUNTER — Ambulatory Visit: Payer: BC Managed Care – PPO | Admitting: Family Medicine

## 2020-07-14 ENCOUNTER — Other Ambulatory Visit: Payer: Self-pay

## 2020-07-16 ENCOUNTER — Other Ambulatory Visit: Payer: Self-pay

## 2020-07-16 ENCOUNTER — Encounter: Payer: Self-pay | Admitting: Family Medicine

## 2020-07-16 ENCOUNTER — Ambulatory Visit: Payer: BC Managed Care – PPO | Admitting: Family Medicine

## 2020-07-16 DIAGNOSIS — K7581 Nonalcoholic steatohepatitis (NASH): Secondary | ICD-10-CM | POA: Diagnosis not present

## 2020-07-16 DIAGNOSIS — E119 Type 2 diabetes mellitus without complications: Secondary | ICD-10-CM | POA: Diagnosis not present

## 2020-07-16 DIAGNOSIS — I10 Essential (primary) hypertension: Secondary | ICD-10-CM | POA: Diagnosis not present

## 2020-07-16 DIAGNOSIS — F17210 Nicotine dependence, cigarettes, uncomplicated: Secondary | ICD-10-CM

## 2020-07-16 LAB — POCT GLYCOSYLATED HEMOGLOBIN (HGB A1C): Hemoglobin A1C: 7.9 % — AB (ref 4.0–5.6)

## 2020-07-16 NOTE — Patient Instructions (Addendum)
Nice to see you. Please work on diet and exercise as we discussed. Somebody from the cancer center will contact you to get you scheduled for a screening CT for lung cancer.

## 2020-07-16 NOTE — Assessment & Plan Note (Addendum)
A1c is uncontrolled.  She had lots of dietary indiscretions over the holidays.  She would prefer to continue with her more recent dietary changes and recheck in 3 months.  She will continue Jardiance 25 mg daily, glimepiride 4 mg daily with breakfast, Metformin 500 mg twice daily, and Trulicity 1.5 mg once weekly.

## 2020-07-16 NOTE — Assessment & Plan Note (Signed)
I encouraged continued efforts to quit smoking.  We will also send our note to Brenda Farmer at the cancer center to get her set up with screening for lung cancer.

## 2020-07-16 NOTE — Assessment & Plan Note (Signed)
The patient saw GI and they did recommend a ultrasound with elastography though the patient declined this as she notes the GI PA stated to her that he did not quite understand why she was there though they would proceed with the work-up anyway.  Viral hepatitis panel was negative.

## 2020-07-16 NOTE — Progress Notes (Signed)
[name], MD Phone: [phone]  Brenda Farmer is a 55 y.o. female who presents today for f/u.  DIABETES Disease Monitoring: Blood Sugar ranges-120-130 Polyuria/phagia/dipsia- no      Optho- UTD, she will bring a copy of the note Medications: Compliance- taking jardiance, glimeperide, metformin, trulicity Hypoglycemic symptoms- no  HYPERTENSION  Disease Monitoring  Home BP Monitoring not checking Chest pain- no    Dyspnea- no Medications  Compliance-  Taking HCTZ, lisinopril  Edema- no  Nicotine dependence, cigarettes: Patient is working on quitting smoking.  She is down to 9 cigarettes/day.  She has smoked since age 56 and was typically smoking 1 pack/day.    Social History   Tobacco Use  Smoking Status Current Every Day Smoker  . Packs/day: 0.50  . Types: Cigarettes  Smokeless Tobacco Never Used  Tobacco Comment   working on quitting    Current Outpatient Medications on File Prior to Visit  Medication Sig Dispense Refill  . Blood Glucose Monitoring Suppl (CONTOUR BLOOD GLUCOSE SYSTEM) w/Device KIT Used to check blood glucose 3 times daily, diagnosis code E11.9 1 each 0  . diclofenac (VOLTAREN) 75 MG EC tablet TAKE 1 TABLET TWICE A DAY AS NEEDED FOR MILD PAIN 180 tablet 3  . escitalopram (LEXAPRO) 10 MG tablet Take 1 tablet (10 mg total) by mouth daily. 90 tablet 1  . ezetimibe (ZETIA) 10 MG tablet Take 1 tablet (10 mg total) by mouth daily. 90 tablet 3  . glimepiride (AMARYL) 4 MG tablet TAKE 1 TABLET DAILY WITH BREAKFAST 90 tablet 3  . glucose blood (CONTOUR TEST) test strip Use as instructed 100 each 12  . glucose blood test strip once daily.    . hydrochlorothiazide (HYDRODIURIL) 25 MG tablet TAKE 1 TABLET DAILY 90 tablet 3  . JARDIANCE 25 MG TABS tablet TAKE 1 TABLET DAILY 90 tablet 3  . levothyroxine (SYNTHROID) 125 MCG tablet Take 1 tablet (125 mcg total) by mouth daily. 90 tablet 3  . lisinopril (ZESTRIL) 20 MG tablet TAKE 1 TABLET DAILY 90 tablet  3  . metFORMIN (GLUCOPHAGE) 500 MG tablet Take 1 tablet (500 mg total) by mouth 2 (two) times daily with a meal. 180 tablet 3  . Multiple Vitamin (MULTIVITAMIN) tablet Take 1 tablet by mouth daily.    . predniSONE (DELTASONE) 20 MG tablet Take 2 tablets (40 mg total) by mouth daily with breakfast. 10 tablet 0  . rosuvastatin (CRESTOR) 40 MG tablet TAKE 1 TABLET DAILY 90 tablet 3  . TRULICITY 1.5 MG/0.5ML SOPN INJECT 1.5 MG UNDER THE SKIN ONCE A WEEK 6 mL 3  . VENTOLIN HFA 108 (90 Base) MCG/ACT inhaler USE 1 INHALATION EVERY 6 HOURS AS NEEDED FOR WHEEZING OR SHORTNESS OF BREATH 108 g 3   No current facility-administered medications on file prior to visit.     ROS see history of present illness  Objective  Physical Exam Vitals:   07/16/20 1605  BP: 100/60  Pulse: 91  Temp: 98.7 F (37.1 C)  SpO2: 95%    BP Readings from Last 3 Encounters:  07/16/20 100/60  02/13/20 122/70  12/01/19 120/80   Wt Readings from Last 3 Encounters:  07/16/20 165 lb 3.2 oz (74.9 kg)  02/13/20 159 lb (72.1 kg)  12/01/19 161 lb 6.4 oz (73.2 kg)    Physical Exam Constitutional:      General: She is not in acute distress.    Appearance: She is not diaphoretic.  Cardiovascular:     Rate and Rhythm:  Normal rate and regular rhythm.     Heart sounds: Normal heart sounds.  Pulmonary:     Effort: Pulmonary effort is normal.     Breath sounds: Normal breath sounds.  Musculoskeletal:        General: No edema.  Skin:    General: Skin is warm and dry.  Neurological:     Mental Status: She is alert.      Assessment/Plan: Please see individual problem list.  Problem List Items Addressed This Visit    Benign essential HTN    Well-controlled.  Continue HCTZ 25 mg once daily and lisinopril 20 mg daily.  Metabolic panel recently checked through GI.      Controlled type 2 diabetes mellitus without complication (HCC)    J6R is uncontrolled.  She had lots of dietary indiscretions over the holidays.   She would prefer to continue with her more recent dietary changes and recheck in 3 months.  She will continue Jardiance 25 mg daily, glimepiride 4 mg daily with breakfast, Metformin 500 mg twice daily, and Trulicity 1.5 mg once weekly.      Relevant Orders   POCT HgB A1C (Completed)   NASH (nonalcoholic steatohepatitis)    The patient saw GI and they did recommend a ultrasound with elastography though the patient declined this as she notes the GI PA stated to her that he did not quite understand why she was there though they would proceed with the work-up anyway.  Viral hepatitis panel was negative.      Nicotine dependence, cigarettes, uncomplicated    I encouraged continued efforts to quit smoking.  We will also send our note to Burgess Estelle at the cancer center to get her set up with screening for lung cancer.         This visit occurred during the SARS-CoV-2 public health emergency.  Safety protocols were in place, including screening questions prior to the visit, additional usage of staff PPE, and extensive cleaning of exam room while observing appropriate contact time as indicated for disinfecting solutions.    Tommi Rumps, MD [place]

## 2020-07-16 NOTE — Assessment & Plan Note (Signed)
Well-controlled.  Continue HCTZ 25 mg once daily and lisinopril 20 mg daily.  Metabolic panel recently checked through GI.

## 2020-07-28 ENCOUNTER — Telehealth: Payer: Self-pay | Admitting: *Deleted

## 2020-07-28 DIAGNOSIS — F17200 Nicotine dependence, unspecified, uncomplicated: Secondary | ICD-10-CM

## 2020-07-28 DIAGNOSIS — Z122 Encounter for screening for malignant neoplasm of respiratory organs: Secondary | ICD-10-CM

## 2020-07-28 DIAGNOSIS — Z87891 Personal history of nicotine dependence: Secondary | ICD-10-CM

## 2020-07-28 NOTE — Telephone Encounter (Signed)
Received referral for initial lung cancer screening scan. Contacted patient and obtained smoking history,(current, 42 pack year) as well as answering questions related to screening process. Patient denies signs of lung cancer such as weight loss or hemoptysis. Patient denies comorbidity that would prevent curative treatment if lung cancer were found. Patient is scheduled for shared decision making visit and CT scan on 08/10/20 at 2pm.

## 2020-08-02 ENCOUNTER — Other Ambulatory Visit: Payer: Self-pay | Admitting: Family Medicine

## 2020-08-02 DIAGNOSIS — E039 Hypothyroidism, unspecified: Secondary | ICD-10-CM

## 2020-08-10 ENCOUNTER — Telehealth: Payer: BC Managed Care – PPO | Admitting: Nurse Practitioner

## 2020-08-10 ENCOUNTER — Ambulatory Visit: Payer: BC Managed Care – PPO

## 2020-08-11 ENCOUNTER — Ambulatory Visit
Admission: RE | Admit: 2020-08-11 | Discharge: 2020-08-11 | Disposition: A | Discharge status: Home / Self Care | Payer: BC Managed Care – PPO | Source: Ambulatory Visit | Attending: Nurse Practitioner | Admitting: Nurse Practitioner

## 2020-08-11 ENCOUNTER — Inpatient Hospital Stay: Payer: BC Managed Care – PPO | Attending: Oncology | Admitting: Hospice and Palliative Medicine

## 2020-08-11 ENCOUNTER — Other Ambulatory Visit: Payer: Self-pay

## 2020-08-11 DIAGNOSIS — Z87891 Personal history of nicotine dependence: Secondary | ICD-10-CM | POA: Diagnosis not present

## 2020-08-11 DIAGNOSIS — Z122 Encounter for screening for malignant neoplasm of respiratory organs: Secondary | ICD-10-CM | POA: Diagnosis present

## 2020-08-11 DIAGNOSIS — F17210 Nicotine dependence, cigarettes, uncomplicated: Secondary | ICD-10-CM | POA: Diagnosis not present

## 2020-08-11 DIAGNOSIS — F17200 Nicotine dependence, unspecified, uncomplicated: Secondary | ICD-10-CM | POA: Diagnosis present

## 2020-08-11 NOTE — Progress Notes (Signed)
Virtual Visit via Video Note  I connected with@ on 08/11/20 at@ by a video enabled telemedicine application and verified that I am speaking with the correct person using two identifiers.   I discussed the limitations of evaluation and management by telemedicine and the availability of in person appointments. The patient expressed understanding and agreed to proceed.  In accordance with CMS guidelines, patient has met eligibility criteria including age, absence of signs or symptoms of lung cancer.  Social History   Tobacco Use  . Smoking status: Current Every Day Smoker    Packs/day: 1.00    Years: 42.00    Pack years: 42.00    Types: Cigarettes  . Smokeless tobacco: Never Used  . Tobacco comment: working on quitting  Vaping Use  . Vaping Use: Never used  Substance Use Topics  . Alcohol use: Yes    Alcohol/week: 0.0 - 1.0 standard drinks    Comment: Rare usage   . Drug use: No      A shared decision-making session was conducted prior to the performance of CT scan. This includes one or more decision aids, includes benefits and harms of screening, follow-up diagnostic testing, over-diagnosis, false positive rate, and total radiation exposure.   Counseling on the importance of adherence to annual lung cancer LDCT screening, impact of co-morbidities, and ability or willingness to undergo diagnosis and treatment is imperative for compliance of the program.   Counseling on the importance of continued smoking cessation for former smokers; the importance of smoking cessation for current smokers, and information about tobacco cessation interventions have been given to patient including [place] and 1800 quit [place] programs.   Written order for lung cancer screening with LDCT has been given to the patient and any and all questions have been answered to the best of my abilities.    Yearly follow up will be coordinated by Burgess Estelle, Thoracic Navigator.  Time Total: 15  minutes  Visit consisted of counseling and education dealing with complex health screening. Greater than 50%  of this time was spent counseling and coordinating care related to the above assessment and plan.  Signed by: Altha Harm, PhD, NP-C

## 2020-08-17 ENCOUNTER — Encounter: Payer: Self-pay | Admitting: *Deleted

## 2020-08-27 ENCOUNTER — Other Ambulatory Visit: Payer: Self-pay | Admitting: Family Medicine

## 2020-08-27 DIAGNOSIS — E119 Type 2 diabetes mellitus without complications: Secondary | ICD-10-CM

## 2020-08-27 DIAGNOSIS — E782 Mixed hyperlipidemia: Secondary | ICD-10-CM

## 2020-08-31 ENCOUNTER — Other Ambulatory Visit: Payer: Self-pay | Admitting: Family Medicine

## 2020-08-31 MED ORDER — ALBUTEROL SULFATE HFA 108 (90 BASE) MCG/ACT IN AERS
1.0000 | INHALATION_SPRAY | Freq: Four times a day (QID) | RESPIRATORY_TRACT | 0 refills | Status: DC | PRN
Start: 2020-08-31 — End: 2022-11-24

## 2020-09-06 ENCOUNTER — Ambulatory Visit
Admission: EM | Admit: 2020-09-06 | Discharge: 2020-09-06 | Disposition: A | Discharge status: Home / Self Care | Payer: BC Managed Care – PPO | Attending: Emergency Medicine | Admitting: Emergency Medicine

## 2020-09-06 DIAGNOSIS — M545 Low back pain, unspecified: Secondary | ICD-10-CM

## 2020-09-06 MED ORDER — METHOCARBAMOL 500 MG PO TABS: 500.0000 mg | ORAL_TABLET | Freq: Two times a day (BID) | ORAL | 0 refills | Status: DC | PRN

## 2020-09-06 NOTE — ED Triage Notes (Signed)
Patient presents to Urgent Care with complaints of right lower back pain that is related from bending and coughing at same time. She states she heard a "popping sound" and pain has not resolved. Treating pain with Advil last dose at 0700.  Denies urinary symptoms, fever, n/v, or diarrhea.

## 2020-09-06 NOTE — ED Provider Notes (Signed)
Roderic Palau    CSN: [id number] Arrival date & time: 09/06/20  7425      History   Chief Complaint Chief Complaint  Patient presents with  . Back Pain    HPI Brenda Farmer is a 55 y.o. female.   Patient presents with right lower back pain x1 day.  She states she was making up a bed at home, bending over and coughed at the same time.  She heard a pop in her right lower back and has pain in that area.  She states she has mild tingling sensation in her right foot; none in her leg.  She denies weakness, numbness, saddle anesthesia, loss of bowel/bladder control, abdominal pain, dysuria, hematuria, vaginal discharge, pelvic pain, or other symptoms.  Treatment attempted at home with Advil with good relief.  Patient had a chest CT/screening for lung cancer on 08/12/2020 which was "benign appearance or behavior"; also showed emphysema and aortic atherosclerosis.  History includes COPD, OSA, hypertension, diabetes, nonalcoholic steatohepatitis, current everyday smoker, obesity.  The history is provided by the patient and medical records.    Past Medical History:  Diagnosis Date  . Arthritis   . Chicken pox   . Diabetes mellitus without complication ([place])   . Diabetic eye exam ([place])   . History of mammogram 08/16/2012   neg  . History of Papanicolaou smear of cervix 06/21/2012   neg/neg  . Hyperlipidemia   . Hypertension   . Hypothyroidism (acquired)   . Obesity   . Sleep apnea   . Thyroid disease     Patient Active Problem List   Diagnosis Date Noted  . Depression, major, single episode, moderate ([place]) 12/01/2019  . Cervical radiculopathy 02/28/2019  . Skin nodule 10/19/2017  . Urine frequency 11/08/2016  . Obesity (BMI 30.0-34.9) 07/26/2016  . Low back pain 06/21/2016  . Hot flashes 01/26/2016  . Nicotine dependence, cigarettes, uncomplicated 95/63/8756  . Numbness and tingling of right distal lateral thigh 01/26/2016  . Tendinopathy of left rotator cuff  10/21/2015  . Hyperlipidemia 10/21/2015  . Controlled type 2 diabetes mellitus without complication ([place]) 43/32/9518  . NASH (nonalcoholic steatohepatitis) 11/09/2014  . Benign essential HTN 10/05/2014  . COPD (chronic obstructive pulmonary disease) ([place]) 10/27/2009  . Hypothyroidism 08/15/2006  . OSA (obstructive sleep apnea) 08/15/2006    Past Surgical History:  Procedure Laterality Date  . ABDOMINAL HYSTERECTOMY  2012   Cervix & ovaries intact  . APPENDECTOMY    . CESAREAN SECTION    . COLONOSCOPY WITH PROPOFOL N/A 11/05/2015   Procedure: COLONOSCOPY WITH PROPOFOL;  Surgeon: Lollie Sails, MD;  Location: [facility] ENDOSCOPY;  Service: Endoscopy;  Laterality: N/A;    OB History   No obstetric history on file.      Home Medications    Prior to Admission medications   Medication Sig Start Date End Date Taking? Authorizing Provider  methocarbamol (ROBAXIN) 500 MG tablet Take 1 tablet (500 mg total) by mouth 2 (two) times daily as needed for muscle spasms. 09/06/20  Yes Sharion Balloon, NP  albuterol (VENTOLIN HFA) 108 (90 Base) MCG/ACT inhaler Inhale 1 puff into the lungs every 6 (six) hours as needed for wheezing or shortness of breath. 08/31/20   Leone Haven, MD  Blood Glucose Monitoring Suppl [facility] BLOOD GLUCOSE SYSTEM) w/Device KIT Used to check blood glucose 3 times daily, diagnosis code E11.9 11/21/17   Leone Haven, MD  diclofenac (VOLTAREN) 75 MG EC tablet TAKE 1 TABLET TWICE A  DAY AS NEEDED FOR MILD PAIN 10/03/19   Leone Haven, MD  escitalopram (LEXAPRO) 10 MG tablet TAKE 1 TABLET DAILY 08/27/20   Leone Haven, MD  ezetimibe (ZETIA) 10 MG tablet TAKE 1 TABLET DAILY 08/27/20   Leone Haven, MD  glimepiride (AMARYL) 4 MG tablet TAKE 1 TABLET DAILY WITH BREAKFAST 10/03/19   Leone Haven, MD  glucose blood (CONTOUR TEST) test strip Use as instructed 11/21/17   Leone Haven, MD  glucose blood test strip once daily. 09/25/14   [provider]  hydrochlorothiazide (HYDRODIURIL) 25 MG tablet TAKE 1 TABLET DAILY 09/29/19   Leone Haven, MD  JARDIANCE 25 MG TABS tablet TAKE 1 TABLET DAILY 01/01/20   Leone Haven, MD  levothyroxine (SYNTHROID) 125 MCG tablet TAKE 1 TABLET DAILY 08/03/20   Leone Haven, MD  lisinopril (ZESTRIL) 20 MG tablet TAKE 1 TABLET DAILY 10/03/19   Leone Haven, MD  metFORMIN (GLUCOPHAGE) 500 MG tablet TAKE 1 TABLET TWICE A DAY WITH MEALS 08/27/20   Leone Haven, MD  Multiple Vitamin (MULTIVITAMIN) tablet Take 1 tablet by mouth daily.    [provider]  predniSONE (DELTASONE) 20 MG tablet Take 2 tablets (40 mg total) by mouth daily with breakfast. 10/28/19   Leone Haven, MD  rosuvastatin (CRESTOR) 40 MG tablet TAKE 1 TABLET DAILY 10/03/19   Leone Haven, MD  TRULICITY 1.5 WY/6.1UO SOPN INJECT 1.5 MG UNDER THE SKIN ONCE A WEEK 12/24/19   Leone Haven, MD    Family History Family History  Problem Relation Age of Onset  . Arthritis Mother   . Hyperlipidemia Mother   . Cervical cancer Mother 34       Malignant  . Alcohol abuse Father   . Hyperlipidemia Father   . Heart disease Father   . Hypertension Father   . Stroke Maternal Grandmother   . Diabetes Maternal Grandmother   . Arthritis Maternal Grandfather   . Diabetes Maternal Grandfather        DM Type 2  . Asthma Paternal Grandmother   . Heart disease Paternal Grandmother   . Lung cancer Paternal Grandmother        Malignant  . Prostate cancer Paternal Grandfather   . Hyperlipidemia Paternal Grandfather   . Hypertension Paternal Grandfather   . Heart disease Paternal Grandfather   . Lung cancer Paternal Grandfather   . Breast cancer Neg Hx     Social History Social History   Tobacco Use  . Smoking status: Current Every Day Smoker    Packs/day: 1.00    Years: 42.00    Pack years: 42.00    Types: Cigarettes  . Smokeless tobacco: Never Used  . Tobacco comment: working on quitting  Vaping Use   . Vaping Use: Never used  Substance Use Topics  . Alcohol use: Yes    Alcohol/week: 0.0 - 1.0 standard drinks    Comment: Rare usage   . Drug use: No     Allergies   Codeine   Review of Systems Review of Systems  Constitutional: Negative for chills and fever.  HENT: Negative for ear pain and sore throat.   Eyes: Negative for pain and visual disturbance.  Respiratory: Negative for cough and shortness of breath.   Cardiovascular: Negative for chest pain and palpitations.  Gastrointestinal: Negative for abdominal pain, constipation, diarrhea, nausea and vomiting.  Genitourinary: Negative for dysuria, hematuria, pelvic pain and vaginal discharge.  Musculoskeletal: Positive  for back pain. Negative for arthralgias and gait problem.  Skin: Negative for color change and rash.  Neurological: Negative for syncope, weakness and numbness.  All other systems reviewed and are negative.    Physical Exam Triage Vital Signs ED Triage Vitals  Enc Vitals Group     BP 09/06/20 0954 119/78     Pulse Rate 09/06/20 0954 88     Resp 09/06/20 0954 16     Temp 09/06/20 0954 99.1 F (37.3 C)     Temp Source 09/06/20 0954 Oral     SpO2 09/06/20 0954 95 %     Weight 09/06/20 0953 163 lb (73.9 kg)     Height --      Head Circumference --      Peak Flow --      Pain Score 09/06/20 0952 9     Pain Loc --      Pain Edu? --      Excl. in [place]? --    No data found.  Updated Vital Signs BP 119/78 (BP Location: Left Arm)   Pulse 88   Temp 99.1 F (37.3 C) (Oral)   Resp 16   Wt 163 lb (73.9 kg)   SpO2 95%   BMI 34.07 kg/m   Visual Acuity Right Eye Distance:   Left Eye Distance:   Bilateral Distance:    Right Eye Near:   Left Eye Near:    Bilateral Near:     Physical Exam Vitals and nursing note reviewed.  Constitutional:      General: She is not in acute distress.    Appearance: She is well-developed. She is not ill-appearing.  HENT:     Head: Normocephalic and atraumatic.      Mouth/Throat:     Mouth: Mucous membranes are moist.  Eyes:     Conjunctiva/sclera: Conjunctivae normal.  Cardiovascular:     Rate and Rhythm: Normal rate and regular rhythm.     Heart sounds: Normal heart sounds.  Pulmonary:     Effort: Pulmonary effort is normal. No respiratory distress.     Breath sounds: Normal breath sounds.  Abdominal:     Palpations: Abdomen is soft.     Tenderness: There is no abdominal tenderness.  Musculoskeletal:        General: Tenderness present. No swelling or deformity. Normal range of motion.     Cervical back: Neck supple.       Back:     Comments: Spine nontender.  Skin:    General: Skin is warm and dry.     Capillary Refill: Capillary refill takes less than 2 seconds.     Findings: No bruising, erythema, lesion or rash.  Neurological:     General: No focal deficit present.     Mental Status: She is alert and oriented to person, place, and time.     Sensory: No sensory deficit.     Motor: No weakness.     Gait: Gait normal.     Comments: Negative straight leg raise.  Psychiatric:        Mood and Affect: Mood normal.        Behavior: Behavior normal.      UC Treatments / Results  Labs (all labs ordered are listed, but only abnormal results are displayed) Labs Reviewed - No data to display  EKG   Radiology No results found.  Procedures Procedures (including critical care time)  Medications Ordered in UC Medications - No data to display  Initial Impression / Assessment and Plan / UC Course  I have reviewed the triage vital signs and the nursing notes.  Pertinent labs & imaging results that were available during my care of the patient were reviewed by me and considered in my medical decision making (see chart for details).   Acute right lower back pain without sciatica.  Patient has been getting good relief with ibuprofen at home.  Treating with Robaxin; precautions for drowsiness with this medication discussed.  Instructed her  to follow-up with her PCP or an orthopedist if her symptoms or not improving.  She agrees to plan of care.   Final Clinical Impressions(s) / UC Diagnoses   Final diagnoses:  Acute right-sided low back pain without sciatica     Discharge Instructions     Continue taking ibuprofen as needed for discomfort.  Take the muscle relaxer as needed for muscle spasm; Do not drive, operate machinery, or drink alcohol with this medication as it can cause drowsiness.   Follow up with your primary care provider or an orthopedist if your symptoms are not improving.          ED Prescriptions    Medication Sig Dispense Auth. Provider   methocarbamol (ROBAXIN) 500 MG tablet Take 1 tablet (500 mg total) by mouth 2 (two) times daily as needed for muscle spasms. 10 tablet Sharion Balloon, NP     I have reviewed the PDMP during this encounter.   Sharion Balloon, NP 09/06/20 1024

## 2020-09-06 NOTE — Discharge Instructions (Addendum)
Continue taking ibuprofen as needed for discomfort.  Take the muscle relaxer as needed for muscle spasm; Do not drive, operate machinery, or drink alcohol with this medication as it can cause drowsiness.   Follow up with your primary care provider or an orthopedist if your symptoms are not improving.

## 2020-09-22 ENCOUNTER — Other Ambulatory Visit: Payer: Self-pay | Admitting: Family Medicine

## 2020-09-22 DIAGNOSIS — I10 Essential (primary) hypertension: Secondary | ICD-10-CM

## 2020-09-23 ENCOUNTER — Telehealth (INDEPENDENT_AMBULATORY_CARE_PROVIDER_SITE_OTHER): Payer: BC Managed Care – PPO | Admitting: Internal Medicine

## 2020-09-23 ENCOUNTER — Encounter: Payer: Self-pay | Admitting: Internal Medicine

## 2020-09-23 ENCOUNTER — Other Ambulatory Visit: Payer: Self-pay

## 2020-09-23 VITALS — Ht <= 58 in | Wt 165.0 lb

## 2020-09-23 DIAGNOSIS — J441 Chronic obstructive pulmonary disease with (acute) exacerbation: Secondary | ICD-10-CM

## 2020-09-23 MED ORDER — DM-GUAIFENESIN ER 60-1200 MG PO TB12: 1.0000 | ORAL_TABLET | Freq: Two times a day (BID) | ORAL | 0 refills | Status: DC

## 2020-09-23 MED ORDER — PREDNISONE 20 MG PO TABS: 40.0000 mg | ORAL_TABLET | Freq: Every day | ORAL | 0 refills | Status: DC

## 2020-09-23 MED ORDER — AMOXICILLIN-POT CLAVULANATE 875-125 MG PO TABS
1.0000 | ORAL_TABLET | Freq: Two times a day (BID) | ORAL | 0 refills | Status: DC
Start: 2020-09-23 — End: 2021-02-28

## 2020-09-23 MED ORDER — FLUCONAZOLE 150 MG PO TABS: 150.0000 mg | ORAL_TABLET | Freq: Once | ORAL | 0 refills | Status: AC

## 2020-09-23 MED ORDER — BENZONATATE 200 MG PO CAPS: 200.0000 mg | ORAL_CAPSULE | Freq: Three times a day (TID) | ORAL | 0 refills | Status: DC | PRN

## 2020-09-23 MED ORDER — ADVAIR HFA 230-21 MCG/ACT IN AERO: 2.0000 | INHALATION_SPRAY | Freq: Two times a day (BID) | RESPIRATORY_TRACT | 12 refills | Status: DC

## 2020-09-23 NOTE — Patient Instructions (Signed)
You also may want to try these supplements  Your insurance would not covid symbicort do Advair is a cousin which they prefer   Mucinex dm green label for cough.  Vitamin C 1000 mg daily.  Vitamin D3 4000 Iu (units) daily.  Zinc 100 mg daily.  Quercetin 250-500 mg 2 times per day   Elderberry  Oil of oregano  cepacol or chloroseptic spray  Warm tea with honey and lemon/warm salt water gargles Hydration  Try to eat though you dont feel like it   Tylenol or Advil  Nasal saline  Flonase for nasal congestion     Monitor pulse oximeter, buy from [place] if oxygen is less than 90 please go to the hospital.        Are you feeling really sick? Shortness of breath, cough, chest pain?, dizziness? Confusion   If so let me know  If worsening, go to hospital or [facility] clinic Urgent care for further treatment

## 2020-09-23 NOTE — Progress Notes (Signed)
Patient has history of bronchitis. Having burning scratchy throat, coughing at time productive, itchy ears, chest discomfort.   Several people at work are sick, coworker diagnosed with contagious bronchitis. Patient tested negative with home test for Covid. Coworker was negative as well.

## 2020-09-23 NOTE — Progress Notes (Signed)
Virtual Visit via Video Note  I connected with@  on 09/23/20 at  8:30 AM EDT by a video enabled telemedicine application and verified that I am speaking with the correct person using two identifiers.  Location patient: home, [place] Location provider:work or home office Persons participating in the virtual visit: patient, provider  I discussed the limitations of evaluation and management by telemedicine and the availability of in person appointments. The patient expressed understanding and agreed to proceed.   HPI:  Acute telemedicine visit for : 08/11/20 CT + COPD and h/o bronchitis. Having burning scratchy throat, coughing at time productive, itchy ears, chest discomfort.  Several people at work are sick, coworker diagnosed with contagious bronchitis but negative covid test with MD and her home test for Covid negative. Symptoms started this week tried otc alkaseltzer daytime and night ricola cough drops smoking 2 cig qd cut back.temp has been 100 F max and she is wheezing as well  Her coworker was dx'ed bronchitis   -COVID-19 vaccine status: 3/3 moderna  ROS: See pertinent positives and negatives per HPI.  Past Medical History:  Diagnosis Date  . Arthritis   . Chicken pox   . Diabetes mellitus without complication ([place])   . Diabetic eye exam ([place])   . History of mammogram 08/16/2012   neg  . History of Papanicolaou smear of cervix 06/21/2012   neg/neg  . Hyperlipidemia   . Hypertension   . Hypothyroidism (acquired)   . Obesity   . Sleep apnea   . Thyroid disease     Past Surgical History:  Procedure Laterality Date  . ABDOMINAL HYSTERECTOMY  2012   Cervix & ovaries intact  . APPENDECTOMY    . CESAREAN SECTION    . COLONOSCOPY WITH PROPOFOL N/A 11/05/2015   Procedure: COLONOSCOPY WITH PROPOFOL;  Surgeon: Lollie Sails, MD;  Location: [facility] ENDOSCOPY;  Service: Endoscopy;  Laterality: N/A;     Current Outpatient Medications:  .  albuterol (VENTOLIN HFA) 108 (90 Base)  MCG/ACT inhaler, Inhale 1 puff into the lungs every 6 (six) hours as needed for wheezing or shortness of breath., Disp: 8 g, Rfl: 0 .  amoxicillin-clavulanate (AUGMENTIN) 875-125 MG tablet, Take 1 tablet by mouth 2 (two) times daily. With food, Disp: 14 tablet, Rfl: 0 .  benzonatate (TESSALON) 200 MG capsule, Take 1 capsule (200 mg total) by mouth 3 (three) times daily as needed for cough., Disp: 30 capsule, Rfl: 0 .  Blood Glucose Monitoring Suppl (CONTOUR BLOOD GLUCOSE SYSTEM) w/Device KIT, Used to check blood glucose 3 times daily, diagnosis code E11.9, Disp: 1 each, Rfl: 0 .  Dextromethorphan-Guaifenesin 60-1200 MG 12hr tablet, Take 1 tablet by mouth every 12 (twelve) hours., Disp: 60 tablet, Rfl: 0 .  diclofenac (VOLTAREN) 75 MG EC tablet, TAKE 1 TABLET TWICE A DAY AS NEEDED FOR MILD PAIN, Disp: 180 tablet, Rfl: 3 .  escitalopram (LEXAPRO) 10 MG tablet, TAKE 1 TABLET DAILY, Disp: 90 tablet, Rfl: 3 .  ezetimibe (ZETIA) 10 MG tablet, TAKE 1 TABLET DAILY, Disp: 90 tablet, Rfl: 3 .  fluconazole (DIFLUCAN) 150 MG tablet, Take 1 tablet (150 mg total) by mouth once for 1 dose., Disp: 1 tablet, Rfl: 0 .  fluticasone-salmeterol (ADVAIR HFA) 230-21 MCG/ACT inhaler, Inhale 2 puffs into the lungs 2 (two) times daily. Rinse mouth, Disp: 1 each, Rfl: 12 .  glimepiride (AMARYL) 4 MG tablet, TAKE 1 TABLET DAILY WITH BREAKFAST, Disp: 90 tablet, Rfl: 3 .  glucose blood (CONTOUR TEST) test strip, Use  as instructed, Disp: 100 each, Rfl: 12 .  glucose blood test strip, once daily., Disp: , Rfl:  .  hydrochlorothiazide (HYDRODIURIL) 25 MG tablet, TAKE 1 TABLET DAILY, Disp: 90 tablet, Rfl: 3 .  JARDIANCE 25 MG TABS tablet, TAKE 1 TABLET DAILY, Disp: 90 tablet, Rfl: 3 .  levothyroxine (SYNTHROID) 125 MCG tablet, TAKE 1 TABLET DAILY, Disp: 90 tablet, Rfl: 0 .  lisinopril (ZESTRIL) 20 MG tablet, TAKE 1 TABLET DAILY, Disp: 90 tablet, Rfl: 3 .  metFORMIN (GLUCOPHAGE) 500 MG tablet, TAKE 1 TABLET TWICE A DAY WITH MEALS,  Disp: 180 tablet, Rfl: 3 .  Multiple Vitamin (MULTIVITAMIN) tablet, Take 1 tablet by mouth daily., Disp: , Rfl:  .  predniSONE (DELTASONE) 20 MG tablet, Take 2 tablets (40 mg total) by mouth daily with breakfast., Disp: 14 tablet, Rfl: 0 .  rosuvastatin (CRESTOR) 40 MG tablet, TAKE 1 TABLET DAILY, Disp: 90 tablet, Rfl: 3 .  TRULICITY 1.5 MH/9.6QI SOPN, INJECT 1.5 MG UNDER THE SKIN ONCE A WEEK, Disp: 6 mL, Rfl: 3 .  methocarbamol (ROBAXIN) 500 MG tablet, Take 1 tablet (500 mg total) by mouth 2 (two) times daily as needed for muscle spasms. (Patient not taking: Reported on 09/23/2020), Disp: 10 tablet, Rfl: 0  EXAM:  VITALS per patient if applicable:  GENERAL: alert, oriented, appears well and in no acute distress  HEENT: atraumatic, conjunttiva clear, no obvious abnormalities on inspection of external nose and ears  NECK: normal movements of the head and neck  LUNGS: on inspection no signs of respiratory distress, breathing rate appears normal, no obvious gross SOB, gasping or wheezing +coughing on exam   CV: no obvious cyanosis  MS: moves all visible extremities without noticeable abnormality  PSYCH/NEURO: pleasant and cooperative, no obvious depression or anxiety, speech and thought processing grossly intact  ASSESSMENT AND PLAN:  Discussed the following assessment and plan:  Likely COPD exacerbation (HCC) with h/o bronchitis - Plan: amoxicillin-clavulanate (AUGMENTIN) 875-125 MG tablet bid x 7days, fluconazole (DIFLUCAN) 150 MG tablet, predniSONE (DELTASONE) 40 MG tablet x 7 days, benzonatate (TESSALON) 200 MG capsule, fluticasone-salmeterol (ADVAIR HFA) 230-21 MCG/ACT inhaler, Dextromethorphan-Guaifenesin 60-1200 MG 12hr tablet rec smoking cessation  Declines covid testing had home test negative   You also may want to try these supplements  Your insurance would not covid symbicort do Advair is a cousin which they prefer   Mucinex dm green label for cough.  Vitamin C 1000 mg  daily.  Vitamin D3 4000 Iu (units) daily.  Zinc 100 mg daily.  Quercetin 250-500 mg 2 times per day   Elderberry  Oil of oregano  cepacol or chloroseptic spray  Warm tea with honey and lemon/warm salt water gargles Hydration  Try to eat though you dont feel like it   Tylenol or Advil  Nasal saline  Flonase for nasal congestion     Monitor pulse oximeter, buy from [place] if oxygen is less than 90 please go to the hospital.        Are you feeling really sick? Shortness of breath, cough, chest pain?, dizziness? Confusion   If so let me know  If worsening, go to hospital or [facility] clinic Urgent care for further treatment     -we discussed possible serious and likely etiologies, options for evaluation and workup, limitations of telemedicine visit vs in person visit, treatment, treatment risks and precautions.    Discussed options for inperson care if PCP office not available. Did let this patient know that I only do telemedicine  on Tuesdays and Thursdays for [place]. Advised to schedule follow up visit with PCP or UCC if any further questions or concerns to avoid delays in care.   I discussed the assessment and treatment plan with the patient. The patient was provided an opportunity to ask questions and all were answered. The patient agreed with the plan and demonstrated an understanding of the instructions.    Time spent 20 min.  Nino Glow McLean-Scocuzza, MD

## 2020-10-06 LAB — HM COLONOSCOPY

## 2020-10-22 ENCOUNTER — Ambulatory Visit: Payer: BC Managed Care – PPO | Admitting: Family Medicine

## 2020-10-25 ENCOUNTER — Other Ambulatory Visit: Payer: Self-pay | Admitting: Family Medicine

## 2020-10-25 DIAGNOSIS — E119 Type 2 diabetes mellitus without complications: Secondary | ICD-10-CM

## 2020-11-01 ENCOUNTER — Other Ambulatory Visit: Payer: Self-pay | Admitting: Family Medicine

## 2020-11-01 DIAGNOSIS — E039 Hypothyroidism, unspecified: Secondary | ICD-10-CM

## 2020-11-12 ENCOUNTER — Ambulatory Visit: Payer: BC Managed Care – PPO | Admitting: Family Medicine

## 2020-11-12 ENCOUNTER — Other Ambulatory Visit: Payer: Self-pay

## 2020-11-12 ENCOUNTER — Ambulatory Visit (INDEPENDENT_AMBULATORY_CARE_PROVIDER_SITE_OTHER): Payer: BC Managed Care – PPO

## 2020-11-12 ENCOUNTER — Encounter: Payer: Self-pay | Admitting: Family Medicine

## 2020-11-12 VITALS — BP 121/70 | HR 92 | Temp 98.6°F | Ht 58.5 in | Wt 169.2 lb

## 2020-11-12 DIAGNOSIS — M545 Low back pain, unspecified: Secondary | ICD-10-CM

## 2020-11-12 DIAGNOSIS — G8929 Other chronic pain: Secondary | ICD-10-CM

## 2020-11-12 DIAGNOSIS — I10 Essential (primary) hypertension: Secondary | ICD-10-CM

## 2020-11-12 DIAGNOSIS — E119 Type 2 diabetes mellitus without complications: Secondary | ICD-10-CM | POA: Diagnosis not present

## 2020-11-12 DIAGNOSIS — F17210 Nicotine dependence, cigarettes, uncomplicated: Secondary | ICD-10-CM

## 2020-11-12 DIAGNOSIS — F321 Major depressive disorder, single episode, moderate: Secondary | ICD-10-CM

## 2020-11-12 NOTE — Patient Instructions (Signed)
Nice to see you.  Please continue to work on smoking cessation.  You can try adding nicotine gum or lozenges for cravings. Please stop your glimepiride. You can taper down on the Lexapro to 5 mg once daily for 2 weeks and then discontinue this. We will contact you with your lab results and x-ray results.

## 2020-11-12 NOTE — Assessment & Plan Note (Signed)
>>  ASSESSMENT AND PLAN FOR DEPRESSION, MAJOR, SINGLE EPISODE, MODERATE (HCC) WRITTEN ON 11/12/2020  4:13 PM BY SONNENBERG, ERIC G, MD  Asymptomatic.  We will have her taper off of Lexapro .  She will take Lexapro  5 mg once daily and then discontinue this medication.  If she has recurrence of depression or any anxiety or any other new symptoms with this taper she will let us  know right away.

## 2020-11-12 NOTE — Assessment & Plan Note (Signed)
Well-controlled.  She will continue hydrochlorothiazide 25 mg once daily and lisinopril 20 mg daily.  Check BMP.

## 2020-11-12 NOTE — Assessment & Plan Note (Signed)
Asymptomatic.  We will have her taper off of Lexapro.  She will take Lexapro 5 mg once daily and then discontinue this medication.  If she has recurrence of depression or any anxiety or any other new symptoms with this taper she will let us know right away.

## 2020-11-12 NOTE — Assessment & Plan Note (Addendum)
Check A1c.  Fasting sugars are elevated.  She has made good diet and exercise changes and I encouraged her to continue those.  She will discontinue glimepiride as she has been on this for many years.  It is likely not providing any benefit at this time.  She will continue Jardiance 25 mg daily, Trulicity 1.5 mg once weekly, and metformin 500 mg twice daily.

## 2020-11-12 NOTE — Progress Notes (Signed)
Tommi Rumps, MD Phone: [phone]  Brenda Farmer is a 55 y.o. female who presents today for follow-up.  Diabetes: Has been running around 160 fasting.  She has been on Jardiance, metformin, Trulicity, and glimepiride.  She reports has been on glimepiride for years.  No polyuria or polydipsia.  No hypoglycemia.  She has seen ophthalmology.  She does not eat carbs.  She has cut down significantly fast food and fried foods.  Eating more vegetables.  She is exercising 4-5 times a week.  Depression: No depressive symptoms.  She has a been on Lexapro.  No SI.  Hypertension: Not checking blood pressures at home.  Taking lisinopril and HCTZ.  No chest pain, shortness of breath, or edema.  Nicotine dependence: She is trying to quit smoking cigarettes.  She is using patches though notes she has a craving right when she wakes up in the morning.  She is thinking about using a patch overnight.  Low back pain: This is a chronic issue.  She notes 4 months ago she was bending over changing sheets on her bed and sneezed and had excruciating right low back pain.  She was seen at the walk-in clinic and prescribed a steroid and muscle relaxer.  Since then she has had intermittent bilateral leg numbness.  She notes some stress incontinence though no other significant incontinence of bowel or bladder.  No saddle anesthesia.  Social History   Tobacco Use  Smoking Status Every Day   Packs/day: 1.00   Years: 42.00   Pack years: 42.00   Types: Cigarettes  Smokeless Tobacco Never  Tobacco Comments   working on quitting    Current Outpatient Medications on File Prior to Visit  Medication Sig Dispense Refill   albuterol (VENTOLIN HFA) 108 (90 Base) MCG/ACT inhaler Inhale 1 puff into the lungs every 6 (six) hours as needed for wheezing or shortness of breath. 8 g 0   Blood Glucose Monitoring Suppl (CONTOUR BLOOD GLUCOSE SYSTEM) w/Device KIT Used to check blood glucose 3 times daily, diagnosis code  E11.9 1 each 0   diclofenac (VOLTAREN) 75 MG EC tablet TAKE 1 TABLET TWICE A DAY AS NEEDED FOR MILD PAIN 180 tablet 3   ezetimibe (ZETIA) 10 MG tablet TAKE 1 TABLET DAILY 90 tablet 3   fluticasone-salmeterol (ADVAIR HFA) 230-21 MCG/ACT inhaler Inhale 2 puffs into the lungs 2 (two) times daily. Rinse mouth 1 each 12   glucose blood (CONTOUR TEST) test strip Use as instructed 100 each 12   glucose blood test strip once daily.     hydrochlorothiazide (HYDRODIURIL) 25 MG tablet TAKE 1 TABLET DAILY 90 tablet 3   JARDIANCE 25 MG TABS tablet TAKE 1 TABLET DAILY 90 tablet 3   levothyroxine (SYNTHROID) 125 MCG tablet TAKE 1 TABLET DAILY 90 tablet 3   lisinopril (ZESTRIL) 20 MG tablet TAKE 1 TABLET DAILY 90 tablet 3   metFORMIN (GLUCOPHAGE) 500 MG tablet TAKE 1 TABLET TWICE A DAY WITH MEALS 180 tablet 3   Multiple Vitamin (MULTIVITAMIN) tablet Take 1 tablet by mouth daily.     rosuvastatin (CRESTOR) 40 MG tablet TAKE 1 TABLET DAILY 90 tablet 3   TRULICITY 1.5 VE/9.3YB SOPN INJECT 1.5 MG UNDER THE SKIN ONCE A WEEK 6 mL 3   amoxicillin-clavulanate (AUGMENTIN) 875-125 MG tablet Take 1 tablet by mouth 2 (two) times daily. With food (Patient not taking: Reported on 11/12/2020) 14 tablet 0   benzonatate (TESSALON) 200 MG capsule Take 1 capsule (200 mg total) by mouth  3 (three) times daily as needed for cough. (Patient not taking: Reported on 11/12/2020) 30 capsule 0   Dextromethorphan-Guaifenesin 60-1200 MG 12hr tablet Take 1 tablet by mouth every 12 (twelve) hours. (Patient not taking: Reported on 11/12/2020) 60 tablet 0   methocarbamol (ROBAXIN) 500 MG tablet Take 1 tablet (500 mg total) by mouth 2 (two) times daily as needed for muscle spasms. (Patient not taking: Reported on 11/12/2020) 10 tablet 0   predniSONE (DELTASONE) 20 MG tablet Take 2 tablets (40 mg total) by mouth daily with breakfast. (Patient not taking: Reported on 11/12/2020) 14 tablet 0   No current facility-administered medications on file prior to  visit.     ROS see history of present illness  Objective  Physical Exam Vitals:   11/12/20 1525  BP: 121/70  Pulse: 92  Temp: 98.6 F (37 C)  SpO2: 94%    BP Readings from Last 3 Encounters:  11/12/20 121/70  09/06/20 119/78  07/16/20 100/60   Wt Readings from Last 3 Encounters:  11/12/20 169 lb 3.2 oz (76.7 kg)  09/23/20 165 lb (74.8 kg)  09/06/20 163 lb (73.9 kg)    Physical Exam Constitutional:      General: She is not in acute distress.    Appearance: She is not diaphoretic.  Cardiovascular:     Rate and Rhythm: Normal rate and regular rhythm.     Heart sounds: Normal heart sounds.  Pulmonary:     Effort: Pulmonary effort is normal.     Breath sounds: Normal breath sounds.  Musculoskeletal:     Comments: No midline spine tenderness, no midline spine step-off, no muscular back tenderness  Skin:    General: Skin is warm and dry.  Neurological:     Mental Status: She is alert.     Comments: 5/5 strength bilateral quads, hamstrings, plantar flexion, and dorsiflexion, sensation to light touch intact bilateral lower extremities, 2+ patellar reflexes     Assessment/Plan: Please see individual problem list.  Problem List Items Addressed This Visit     Benign essential HTN - Primary    Well-controlled.  She will continue hydrochlorothiazide 25 mg once daily and lisinopril 20 mg daily.  Check BMP.       Relevant Orders   Basic Metabolic Panel (BMET)   Controlled type 2 diabetes mellitus without complication (HCC)    Check A1c.  Fasting sugars are elevated.  She has made good diet and exercise changes and I encouraged her to continue those.  She will discontinue glimepiride as she has been on this for many years.  It is likely not providing any benefit at this time.  She will continue Jardiance 25 mg daily, Trulicity 1.5 mg once weekly, and metformin 500 mg twice daily.       Relevant Orders   HgB A1c   Depression, major, single episode, moderate (HCC)     Asymptomatic.  We will have her taper off of Lexapro.  She will take Lexapro 5 mg once daily and then discontinue this medication.  If she has recurrence of depression or any anxiety or any other new symptoms with this taper she will let us know right away.       Low back pain    Recent exacerbation of her chronic low back pain.  She has had intermittent numbness in both of her legs since this occurred.  Discussed getting an x-ray today.  May need to get an MRI as well or consider physical therapy.  Relevant Orders   DG Lumbar Spine Complete   Nicotine dependence, cigarettes, uncomplicated    Congratulated on smoking cessation attempt.  She will continue nicotine patches.  I advised against wearing this at night.  Discussed adding in nicotine gum or lozenges for breakthrough cravings.        Return in about 3 months (around 02/12/2021).  This visit occurred during the SARS-CoV-2 public health emergency.  Safety protocols were in place, including screening questions prior to the visit, additional usage of staff PPE, and extensive cleaning of exam room while observing appropriate contact time as indicated for disinfecting solutions.    Tommi Rumps, MD [place]

## 2020-11-12 NOTE — Assessment & Plan Note (Signed)
Congratulated on smoking cessation attempt.  She will continue nicotine patches.  I advised against wearing this at night.  Discussed adding in nicotine gum or lozenges for breakthrough cravings.

## 2020-11-12 NOTE — Assessment & Plan Note (Signed)
Recent exacerbation of her chronic low back pain.  She has had intermittent numbness in both of her legs since this occurred.  Discussed getting an x-ray today.  May need to get an MRI as well or consider physical therapy.

## 2020-11-13 LAB — BASIC METABOLIC PANEL
BUN: 23 mg/dL (ref 7–25)
CO2: 28 mmol/L (ref 20–32)
Calcium: 10.1 mg/dL (ref 8.6–10.4)
Chloride: 103 mmol/L (ref 98–110)
Creat: 0.79 mg/dL (ref 0.50–1.05)
Glucose, Bld: 207 mg/dL — ABNORMAL HIGH (ref 65–99)
Potassium: 4.3 mmol/L (ref 3.5–5.3)
Sodium: 139 mmol/L (ref 135–146)

## 2020-11-13 LAB — HEMOGLOBIN A1C
Hgb A1c MFr Bld: 8.5 % of total Hgb — ABNORMAL HIGH (ref <5.7)
Mean Plasma Glucose: 197 mg/dL
eAG (mmol/L): 10.9 mmol/L

## 2020-11-15 ENCOUNTER — Other Ambulatory Visit: Payer: Self-pay

## 2020-11-15 DIAGNOSIS — E119 Type 2 diabetes mellitus without complications: Secondary | ICD-10-CM

## 2020-11-15 MED ORDER — TRULICITY 3 MG/0.5ML ~~LOC~~ SOAJ: 3.0000 mg | SUBCUTANEOUS | 1 refills | Status: DC

## 2020-11-17 ENCOUNTER — Encounter: Payer: Self-pay | Admitting: Family Medicine

## 2020-11-17 MED ORDER — BLOOD GLUCOSE MONITOR KIT: PACK | 0 refills | Status: DC

## 2020-11-20 ENCOUNTER — Other Ambulatory Visit: Payer: Self-pay | Admitting: Family Medicine

## 2020-11-20 DIAGNOSIS — M5416 Radiculopathy, lumbar region: Secondary | ICD-10-CM

## 2020-11-25 ENCOUNTER — Other Ambulatory Visit: Payer: Self-pay | Admitting: Family Medicine

## 2020-11-25 DIAGNOSIS — E782 Mixed hyperlipidemia: Secondary | ICD-10-CM

## 2020-11-25 DIAGNOSIS — M542 Cervicalgia: Secondary | ICD-10-CM

## 2020-12-04 ENCOUNTER — Other Ambulatory Visit: Payer: Self-pay | Admitting: Family Medicine

## 2020-12-04 DIAGNOSIS — M4316 Spondylolisthesis, lumbar region: Secondary | ICD-10-CM

## 2020-12-14 ENCOUNTER — Other Ambulatory Visit: Payer: Self-pay

## 2020-12-14 ENCOUNTER — Ambulatory Visit
Admission: RE | Admit: 2020-12-14 | Discharge: 2020-12-14 | Disposition: A | Discharge status: Home / Self Care | Payer: BC Managed Care – PPO | Source: Ambulatory Visit | Attending: Family Medicine | Admitting: Family Medicine

## 2020-12-14 DIAGNOSIS — M4316 Spondylolisthesis, lumbar region: Secondary | ICD-10-CM | POA: Diagnosis present

## 2021-01-04 ENCOUNTER — Other Ambulatory Visit: Payer: Self-pay | Admitting: Neurosurgery

## 2021-01-11 ENCOUNTER — Encounter
Admission: RE | Admit: 2021-01-11 | Discharge: 2021-01-11 | Disposition: A | Discharge status: Home / Self Care | Payer: BC Managed Care – PPO | Source: Ambulatory Visit | Attending: Neurosurgery | Admitting: Neurosurgery

## 2021-01-11 ENCOUNTER — Other Ambulatory Visit: Payer: Self-pay

## 2021-01-11 DIAGNOSIS — Z01818 Encounter for other preprocedural examination: Secondary | ICD-10-CM | POA: Insufficient documentation

## 2021-01-11 LAB — TYPE AND SCREEN
ABO/RH(D): O POS
Antibody Screen: NEGATIVE

## 2021-01-11 LAB — BASIC METABOLIC PANEL
Anion gap: 13 (ref 5–15)
BUN: 17 mg/dL (ref 6–20)
CO2: 27 mmol/L (ref 22–32)
Calcium: 9.9 mg/dL (ref 8.9–10.3)
Chloride: 100 mmol/L (ref 98–111)
Creatinine, Ser: 0.67 mg/dL (ref 0.44–1.00)
GFR, Estimated: >60 mL/min (ref >60)
Glucose, Bld: 145 mg/dL — ABNORMAL HIGH (ref 70–99)
Potassium: 3.8 mmol/L (ref 3.5–5.1)
Sodium: 140 mmol/L (ref 135–145)

## 2021-01-11 LAB — CBC
HCT: 45.1 % (ref 36.0–46.0)
Hemoglobin: 15.3 g/dL — ABNORMAL HIGH (ref 12.0–15.0)
MCH: 29.9 pg (ref 26.0–34.0)
MCHC: 33.9 g/dL (ref 30.0–36.0)
MCV: 88.1 fL (ref 80.0–100.0)
Platelets: 262 10*3/uL (ref 150–400)
RBC: 5.12 MIL/uL — ABNORMAL HIGH (ref 3.87–5.11)
RDW: 14.6 % (ref 11.5–15.5)
WBC: 9.3 10*3/uL (ref 4.0–10.5)
nRBC: 0 % (ref 0.0–0.2)

## 2021-01-11 LAB — SURGICAL PCR SCREEN
MRSA, PCR: NEGATIVE
Staphylococcus aureus: NEGATIVE

## 2021-01-11 LAB — APTT: aPTT: 32 seconds (ref 24–36)

## 2021-01-11 NOTE — Patient Instructions (Addendum)
Your procedure is scheduled on: 01/17/2021 Report to the Registration Desk on the 1st floor of the [facility]. To find out your arrival time, please call [phone] between 1PM - 3PM on: 01/14/2021  REMEMBER: Instructions that are not followed completely may result in serious medical risk, up to and including death; or upon the discretion of your surgeon and anesthesiologist your surgery may need to be rescheduled.  Do not eat food after midnight the night before surgery.  No gum chewing, lozengers or hard candies.  You may however, drink water up to 2 hours before you are scheduled to arrive for your surgery. Do not drink anything within 2 hours of your scheduled arrival time.  Type 1 and Type 2 diabetics should only drink water.  TAKE THESE MEDICATIONS THE MORNING OF SURGERY WITH A SIP OF WATER: Zetia Levothyroxine crestor   Use advair  HFA inhaler at home and bring albuterol inhaler to the hospital on day of surgery in case you need it.   Stop Metformin  2 days prior to surgery. Last dose will be  01/14/2021 Stop Jardiance 3 days prior to surgery . Last dose will be 01/13/2021  You were instructed by surgeons 'office to stop diclofenac on week prior to surgery.   One week prior to surgery: Stop Anti-inflammatories (NSAIDS) such as Advil, Aleve, Ibuprofen, Motrin, Naproxen, Naprosyn and Aspirin based products such as Excedrin, Goodys Powder, BC Powder, diclofenac,  Stop ANY OVER THE COUNTER supplements until after surgery. multivitamin, You may however, continue to take Tylenol if needed for pain up until the day of surgery.  No Alcohol for 24 hours before or after surgery.  No Smoking including e-cigarettes for 24 hours prior to surgery.  No chewable tobacco products for at least 6 hours prior to surgery.  No nicotine patches on the day of surgery.  Do not use any "recreational" drugs for at least a week prior to your surgery.  Please be advised that the combination of  cocaine and anesthesia may have negative outcomes, up to and including death. If you test positive for cocaine, your surgery will be cancelled.  On the morning of surgery brush your teeth with toothpaste and water, you may rinse your mouth with mouthwash if you wish. Do not swallow any toothpaste or mouthwash.  Do not wear jewelry, make-up, hairpins, clips or nail polish.  Do not wear lotions, powders, or perfumes.   Do not shave body from the neck down 48 hours prior to surgery just in case you cut yourself which could leave a site for infection.  Also, freshly shaved skin may become irritated if using the CHG soap.  Contact lenses, hearing aids and dentures may not be worn into surgery.  Bring your CPAP to the hospital on day of surgery.  Do not bring valuables to the hospital. [facility] is not responsible for any missing/lost belongings or valuables.   Use CHG Soap or wipes as directed on instruction sheet.  Notify your doctor if there is any change in your medical condition (cold, fever, infection).  Wear comfortable clothing (specific to your surgery type) to the hospital.  After surgery, you can help prevent lung complications by doing breathing exercises.  Take deep breaths and cough every 1-2 hours. Your doctor may order a device called an Incentive Spirometer to help you take deep breaths.  If you are being admitted to the hospital overnight, leave your suitcase in the car. After surgery it may be brought to your  room.  If you are being discharged the day of surgery, you will not be allowed to drive home. You will need a responsible adult (18 years or older) to drive you home and stay with you that night.   If you are taking public transportation, you will need to have a responsible adult (18 years or older) with you. Please confirm with your physician that it is acceptable to use public transportation.   Please call the [place] [facility] at [phone]  if you have any questions about these instructions.  Surgery Visitation Policy:  Patients undergoing a surgery or procedure may have one family member or support person with them as long as that person is not COVID-19 positive or experiencing its symptoms.  That person may remain in the waiting area during the procedure.  Inpatient Visitation:    Visiting hours are 7 a.m. to 8 p.m. Inpatients will be allowed two visitors daily. The visitors may change each day during the patient's stay. No visitors under the age of 66. Any visitor under the age of 60 must be accompanied by an adult. The visitor must pass COVID-19 screenings, use hand sanitizer when entering and exiting the patient's room and wear a mask at all times, including in the patient's room. Patients must also wear a mask when staff or their visitor are in the room. Masking is required regardless of vaccination status.

## 2021-01-13 ENCOUNTER — Other Ambulatory Visit
Admission: RE | Admit: 2021-01-13 | Discharge: 2021-01-13 | Disposition: A | Discharge status: Home / Self Care | Payer: BC Managed Care – PPO | Source: Ambulatory Visit | Attending: Neurosurgery | Admitting: Neurosurgery

## 2021-01-13 ENCOUNTER — Other Ambulatory Visit: Payer: Self-pay

## 2021-01-13 DIAGNOSIS — U071 COVID-19: Secondary | ICD-10-CM | POA: Insufficient documentation

## 2021-01-13 DIAGNOSIS — Z01812 Encounter for preprocedural laboratory examination: Secondary | ICD-10-CM | POA: Diagnosis present

## 2021-01-13 DIAGNOSIS — Z8616 Personal history of COVID-19: Secondary | ICD-10-CM

## 2021-01-13 HISTORY — DX: Personal history of COVID-19: Z86.16

## 2021-01-14 ENCOUNTER — Encounter: Payer: Self-pay | Admitting: Urgent Care

## 2021-01-14 ENCOUNTER — Telehealth: Payer: Self-pay | Admitting: Urgent Care

## 2021-01-14 LAB — SARS CORONAVIRUS 2 (TAT 6-24 HRS): SARS Coronavirus 2: POSITIVE — AB

## 2021-01-14 NOTE — Progress Notes (Signed)
  Perioperative Services Pre-Admission/Anesthesia Testing    Date: 01/14/21  Name: Brenda Farmer MRN:   [id number]  Re: Postpone surgery  Planned Surgical Procedure(s):     Clinical Notes:  Patient scheduled for the above procedure on 01/17/2021 with Dr. Deetta Perla, MD.  In preparation for her procedure, patient presented to PAT clinic on your 01/13/2021 for preoperative testing.  Preoperative SARS-CoV-2 testing resulted as (+), which will unfortunately means that patient's schedule procedure will need to be postponed.  Per policy, elective cases are deferred for 10 days following (+) result, and extended to 21 days if patient remains symptomatic at the 10-day mark.  Out of an abundance of caution, [facility] neurosurgery generally defers elective procedures for 6 weeks following SARS-CoV-2 diagnosis.  Covid-19 Nucleic Acid Test Results Lab Results  Component Value Date   SARSCOV2NAA POSITIVE (A) 01/13/2021   Impression and Plan:  Brenda Farmer tested (+) for SARS-CoV-2 on 01/13/2021.  Primary attending surgeons office has been notified of the result. Office to contact patient to discuss results and changes and her surgical plan of care.  Patient's planned surgical case with Dr. Lacinda Axon is being rescheduled for 02/28/2021.  Honor Loh, MSN, APRN, FNP-C, CEN [facility]  Peri-operative Services Nurse Practitioner Phone: [phone] 01/14/21 10:01 AM  NOTE: This note has been prepared using Dragon dictation software. Despite my best ability to proofread, there is always the potential that unintentional transcriptional errors may still occur from this process.

## 2021-02-14 ENCOUNTER — Ambulatory Visit: Payer: BC Managed Care – PPO | Admitting: Family Medicine

## 2021-02-14 ENCOUNTER — Other Ambulatory Visit: Payer: Self-pay | Admitting: Family Medicine

## 2021-02-14 DIAGNOSIS — E119 Type 2 diabetes mellitus without complications: Secondary | ICD-10-CM

## 2021-02-18 ENCOUNTER — Other Ambulatory Visit: Payer: BC Managed Care – PPO

## 2021-02-28 ENCOUNTER — Encounter
Admission: RE | Admit: 2021-02-28 | Discharge: 2021-02-28 | Disposition: A | Discharge status: Home / Self Care | Payer: BC Managed Care – PPO | Source: Ambulatory Visit | Attending: Neurosurgery | Admitting: Neurosurgery

## 2021-02-28 ENCOUNTER — Other Ambulatory Visit: Payer: Self-pay

## 2021-02-28 NOTE — Pre-Procedure Instructions (Signed)
Spoke to patient, denies any changes from pre-op in August for this rescheduled procedure. Reviewed medications, medical and surgical history. Patient stated she had CHG soap and copy of previous instructions. Reviewed all instructions and patient verbalized understanding. She is aware a copy of instructions from today will be in her My Chart acct as an After Visit Summary and to call for any questions.

## 2021-02-28 NOTE — Patient Instructions (Addendum)
Your procedure is scheduled on: 03/07/21 Report to [facility]. To find out your arrival time please call [phone] between 1PM - 3PM on 03/04/21.  Remember: Instructions that are not followed completely may result in serious medical risk, up to and including death, or upon the discretion of your surgeon and anesthesiologist your surgery may need to be rescheduled.     _X__ 1. Do not eat food after midnight the night before your procedure.                 No gum chewing or hard candies. You may drink clear liquids up to 2 hours                 before you are scheduled to arrive for your surgery-                  Diabetics water only  __X__2.  On the morning of surgery brush your teeth with toothpaste and water, you                 may rinse your mouth with mouthwash if you wish.  Do not swallow any              toothpaste of mouthwash.     _X__ 3.  No Alcohol for 24 hours before or after surgery.   _X__ 4.  Do Not Smoke or use e-cigarettes For 24 Hours Prior to Your Surgery.                 Do not use any chewable tobacco products for at least 6 hours prior to                 surgery.  ____  5.  Bring all medications with you on the day of surgery if instructed.   __X__  6.  Notify your doctor if there is any change in your medical condition      (cold, fever, infections).     Do not wear jewelry, make-up, hairpins, clips or nail polish. Do not wear lotions, powders, or perfumes.  Do not shave body hair 48 hours prior to surgery. Men may shave face and neck. Do not bring valuables to the hospital.    [facility] is not responsible for any belongings or valuables.  Contacts, dentures/partials or body piercings may not be worn into surgery. Bring a case for your contacts, glasses or hearing aids, a denture cup will be supplied. Leave your suitcase in the car. After surgery it may be brought to your room. For patients admitted  to the hospital, discharge time is determined by your treatment team.   Patients discharged the day of surgery will not be allowed to drive home.   Please read over the following fact sheets that you were given:   (Pt received at previous pre admit visit)  __X__ Take these medicines the morning of surgery with A SIP OF WATER:    1. ezetimibe (ZETIA) 10 MG tablet  2. levothyroxine (SYNTHROID) 125 MCG tablet  3. rosuvastatin (CRESTOR) 40 MG tablet  4.  5.  6.  ____ Fleet Enema (as directed)   __X__ Use CHG Soap/SAGE wipes as directed  __X__ Use inhalers on the day of surgery  __X__ Stop metformin 2 DAYS BEFORE SURGERY. Stop Jardiance for 3 days before surgery    ____ Take 1/2 of usual insulin dose the night before surgery. No insulin the  morning          of surgery.   ____ Stop Blood Thinners Coumadin/Plavix/Xarelto/Pleta/Pradaxa/Eliquis/Effient/Aspirin  on   Or contact your Surgeon, Cardiologist or Medical Doctor regarding  ability to stop your blood thinners  __X__ Stop Anti-inflammatories 7 days before surgery such as Advil, Ibuprofen, Motrin,  BC or Goodies Powder, Naprosyn, Naproxen, Aleve, Aspirin    __X__ Stop all herbal supplements, fish oil or vitamin E until after surgery.    __X__ Bring C-Pap to the hospital.    Notes from Dr Jonathon Jordan office  We have instructed the patient to hold their blood thinner(s) for surgery as listed below:  stop diclofenac 1 week prior to surgery, can resume 3 months after surgery

## 2021-03-06 MED ORDER — CEFAZOLIN SODIUM-DEXTROSE 2-4 GM/100ML-% IV SOLN
2.0000 g | INTRAVENOUS | Status: AC
  Administered 2021-03-07: 2 g via INTRAVENOUS

## 2021-03-06 MED ORDER — SODIUM CHLORIDE 0.9 % IV SOLN: INTRAVENOUS | Status: DC

## 2021-03-06 MED ORDER — FAMOTIDINE 20 MG PO TABS: 20.0000 mg | ORAL_TABLET | Freq: Once | ORAL | Status: AC

## 2021-03-06 NOTE — Progress Notes (Signed)
Pharmacy Antibiotic Note  DEENA SHAUB is a 55 y.o. female admitted on (Not on file) with surgical prophylaxis.  Pharmacy has been consulted for Cefazolin  dosing.  TBW = 75.4 kg (01/11/21)   Plan: Cefazolin 2 gm IV X 1 60 min pre-op ordered for 10/17 @ 0500.      No data recorded.  No results for input(s): WBC, CREATININE, LATICACIDVEN, VANCOTROUGH, VANCOPEAK, VANCORANDOM, GENTTROUGH, GENTPEAK, GENTRANDOM, TOBRATROUGH, TOBRAPEAK, TOBRARND, AMIKACINPEAK, AMIKACINTROU, AMIKACIN in the last 168 hours.  CrCl cannot be calculated (Patient's most recent lab result is older than the maximum 21 days allowed.).    Allergies  Allergen Reactions   Codeine Itching    Antimicrobials this admission:   >>    >>   Dose adjustments this admission:   Microbiology results:  BCx:   UCx:    Sputum:    MRSA PCR:   Thank you for allowing pharmacy to be a part of this patient's care.  Ingrid Shifrin D 03/06/2021 11:14 PM

## 2021-03-07 ENCOUNTER — Inpatient Hospital Stay: Payer: BC Managed Care – PPO | Admitting: Urgent Care

## 2021-03-07 ENCOUNTER — Inpatient Hospital Stay
Admission: RE | Admit: 2021-03-07 | Discharge: 2021-03-08 | DRG: 455 | Disposition: A | Discharge status: Home / Self Care | Payer: BC Managed Care – PPO | Source: Ambulatory Visit | Attending: Neurosurgery | Admitting: Neurosurgery

## 2021-03-07 ENCOUNTER — Inpatient Hospital Stay: Payer: BC Managed Care – PPO

## 2021-03-07 ENCOUNTER — Other Ambulatory Visit: Payer: Self-pay

## 2021-03-07 ENCOUNTER — Encounter
Admission: RE | Disposition: A | Discharge status: Home / Self Care | Payer: Self-pay | Source: Ambulatory Visit | Attending: Neurosurgery

## 2021-03-07 ENCOUNTER — Encounter: Payer: Self-pay | Admitting: Neurosurgery

## 2021-03-07 DIAGNOSIS — Z8616 Personal history of COVID-19: Secondary | ICD-10-CM

## 2021-03-07 DIAGNOSIS — M5416 Radiculopathy, lumbar region: Secondary | ICD-10-CM | POA: Diagnosis present

## 2021-03-07 DIAGNOSIS — J449 Chronic obstructive pulmonary disease, unspecified: Secondary | ICD-10-CM | POA: Diagnosis present

## 2021-03-07 DIAGNOSIS — Z8249 Family history of ischemic heart disease and other diseases of the circulatory system: Secondary | ICD-10-CM

## 2021-03-07 DIAGNOSIS — Z7985 Long-term (current) use of injectable non-insulin antidiabetic drugs: Secondary | ICD-10-CM | POA: Diagnosis not present

## 2021-03-07 DIAGNOSIS — Z7984 Long term (current) use of oral hypoglycemic drugs: Secondary | ICD-10-CM

## 2021-03-07 DIAGNOSIS — Z79890 Hormone replacement therapy (postmenopausal): Secondary | ICD-10-CM | POA: Diagnosis not present

## 2021-03-07 DIAGNOSIS — E785 Hyperlipidemia, unspecified: Secondary | ICD-10-CM | POA: Diagnosis present

## 2021-03-07 DIAGNOSIS — Z79899 Other long term (current) drug therapy: Secondary | ICD-10-CM | POA: Diagnosis not present

## 2021-03-07 DIAGNOSIS — Z885 Allergy status to narcotic agent status: Secondary | ICD-10-CM | POA: Diagnosis not present

## 2021-03-07 DIAGNOSIS — Z90710 Acquired absence of both cervix and uterus: Secondary | ICD-10-CM | POA: Diagnosis not present

## 2021-03-07 DIAGNOSIS — Z833 Family history of diabetes mellitus: Secondary | ICD-10-CM

## 2021-03-07 DIAGNOSIS — Z6834 Body mass index (BMI) 34.0-34.9, adult: Secondary | ICD-10-CM

## 2021-03-07 DIAGNOSIS — E669 Obesity, unspecified: Secondary | ICD-10-CM | POA: Diagnosis present

## 2021-03-07 DIAGNOSIS — Z8261 Family history of arthritis: Secondary | ICD-10-CM

## 2021-03-07 DIAGNOSIS — M1990 Unspecified osteoarthritis, unspecified site: Secondary | ICD-10-CM | POA: Diagnosis present

## 2021-03-07 DIAGNOSIS — I10 Essential (primary) hypertension: Secondary | ICD-10-CM | POA: Diagnosis present

## 2021-03-07 DIAGNOSIS — Z825 Family history of asthma and other chronic lower respiratory diseases: Secondary | ICD-10-CM

## 2021-03-07 DIAGNOSIS — E039 Hypothyroidism, unspecified: Secondary | ICD-10-CM | POA: Diagnosis present

## 2021-03-07 DIAGNOSIS — Z83438 Family history of other disorder of lipoprotein metabolism and other lipidemia: Secondary | ICD-10-CM

## 2021-03-07 DIAGNOSIS — G4730 Sleep apnea, unspecified: Secondary | ICD-10-CM | POA: Diagnosis present

## 2021-03-07 DIAGNOSIS — F17210 Nicotine dependence, cigarettes, uncomplicated: Secondary | ICD-10-CM | POA: Diagnosis present

## 2021-03-07 DIAGNOSIS — E119 Type 2 diabetes mellitus without complications: Secondary | ICD-10-CM | POA: Diagnosis present

## 2021-03-07 DIAGNOSIS — M4316 Spondylolisthesis, lumbar region: Secondary | ICD-10-CM | POA: Diagnosis present

## 2021-03-07 DIAGNOSIS — M48061 Spinal stenosis, lumbar region without neurogenic claudication: Principal | ICD-10-CM | POA: Diagnosis present

## 2021-03-07 DIAGNOSIS — M4726 Other spondylosis with radiculopathy, lumbar region: Secondary | ICD-10-CM | POA: Diagnosis present

## 2021-03-07 DIAGNOSIS — Z419 Encounter for procedure for purposes other than remedying health state, unspecified: Secondary | ICD-10-CM

## 2021-03-07 HISTORY — PX: MAXIMUM ACCESS (MAS) TRANSFORAMINAL LUMBAR INTERBODY FUSION (TLIF) 1 LEVEL: SHX6392

## 2021-03-07 LAB — TYPE AND SCREEN
ABO/RH(D): O POS
Antibody Screen: NEGATIVE

## 2021-03-07 LAB — GLUCOSE, CAPILLARY
Glucose-Capillary: 188 mg/dL — ABNORMAL HIGH (ref 70–99)
Glucose-Capillary: 217 mg/dL — ABNORMAL HIGH (ref 70–99)
Glucose-Capillary: 362 mg/dL — ABNORMAL HIGH (ref 70–99)
Glucose-Capillary: 259 mg/dL — ABNORMAL HIGH (ref 70–99)

## 2021-03-07 LAB — PROTIME-INR
INR: 0.9 (ref 0.8–1.2)
Prothrombin Time: 12.2 seconds (ref 11.4–15.2)

## 2021-03-07 SURGERY — MAXIMUM ACCESS (MAS) TRANSFORAMINAL LUMBAR INTERBODY FUSION (TLIF) 1 LEVEL
Anesthesia: General

## 2021-03-07 MED ORDER — ADULT MULTIVITAMIN W/MINERALS CH
1.0000 | ORAL_TABLET | Freq: Every day | ORAL | Status: DC
  Administered 2021-03-08: 1 via ORAL
  Filled 2021-03-07: qty 1

## 2021-03-07 MED ORDER — INSULIN ASPART 100 UNIT/ML IJ SOLN
0.0000 [IU] | Freq: Three times a day (TID) | INTRAMUSCULAR | Status: DC
  Administered 2021-03-07: 15 [IU] via SUBCUTANEOUS
  Administered 2021-03-08: 2 [IU] via SUBCUTANEOUS
  Filled 2021-03-07 (×2): qty 1

## 2021-03-07 MED ORDER — LEVOTHYROXINE SODIUM 125 MCG PO TABS
125.0000 ug | ORAL_TABLET | Freq: Every day | ORAL | Status: DC
  Administered 2021-03-08: 125 ug via ORAL
  Filled 2021-03-07: qty 1

## 2021-03-07 MED ORDER — LISINOPRIL 20 MG PO TABS
20.0000 mg | ORAL_TABLET | Freq: Every day | ORAL | Status: DC
  Administered 2021-03-08: 20 mg via ORAL
  Filled 2021-03-07: qty 1

## 2021-03-07 MED ORDER — PROPOFOL 10 MG/ML IV BOLUS
INTRAVENOUS | Status: AC
  Filled 2021-03-07: qty 20

## 2021-03-07 MED ORDER — HYDROMORPHONE HCL 1 MG/ML IJ SOLN
INTRAMUSCULAR | Status: AC
  Filled 2021-03-07: qty 1

## 2021-03-07 MED ORDER — ONDANSETRON HCL 4 MG/2ML IJ SOLN
INTRAMUSCULAR | Status: DC | PRN
  Administered 2021-03-07: 4 mg via INTRAVENOUS

## 2021-03-07 MED ORDER — 0.9 % SODIUM CHLORIDE (POUR BTL) OPTIME
TOPICAL | Status: DC | PRN
  Administered 2021-03-07: 1000 mL

## 2021-03-07 MED ORDER — BUPIVACAINE LIPOSOME 1.3 % IJ SUSP
INTRAMUSCULAR | Status: AC
  Filled 2021-03-07: qty 20

## 2021-03-07 MED ORDER — PROPOFOL 1000 MG/100ML IV EMUL
INTRAVENOUS | Status: AC
  Filled 2021-03-07: qty 100

## 2021-03-07 MED ORDER — ACETAMINOPHEN 650 MG RE SUPP
650.0000 mg | RECTAL | Status: DC | PRN
  Filled 2021-03-07: qty 1

## 2021-03-07 MED ORDER — FENTANYL CITRATE (PF) 100 MCG/2ML IJ SOLN
INTRAMUSCULAR | Status: AC
  Administered 2021-03-07: 50 ug via INTRAVENOUS
  Filled 2021-03-07: qty 2

## 2021-03-07 MED ORDER — BUPIVACAINE-EPINEPHRINE (PF) 0.5% -1:200000 IJ SOLN
INTRAMUSCULAR | Status: AC
  Filled 2021-03-07: qty 30

## 2021-03-07 MED ORDER — METHOCARBAMOL 1000 MG/10ML IJ SOLN
500.0000 mg | Freq: Four times a day (QID) | INTRAVENOUS | Status: DC
  Administered 2021-03-07: 500 mg via INTRAVENOUS
  Filled 2021-03-07: qty 500

## 2021-03-07 MED ORDER — MENTHOL 3 MG MT LOZG
1.0000 | LOZENGE | OROMUCOSAL | Status: DC | PRN
  Filled 2021-03-07: qty 9

## 2021-03-07 MED ORDER — PHENOL 1.4 % MT LIQD
1.0000 | OROMUCOSAL | Status: DC | PRN
  Filled 2021-03-07: qty 177

## 2021-03-07 MED ORDER — METHYLPREDNISOLONE ACETATE 40 MG/ML IJ SUSP
INTRAMUSCULAR | Status: AC
  Filled 2021-03-07: qty 1

## 2021-03-07 MED ORDER — ONDANSETRON HCL 4 MG PO TABS: 4.0000 mg | ORAL_TABLET | Freq: Four times a day (QID) | ORAL | Status: DC | PRN

## 2021-03-07 MED ORDER — CHLORHEXIDINE GLUCONATE 0.12 % MT SOLN: 15.0000 mL | Freq: Once | OROMUCOSAL | Status: AC

## 2021-03-07 MED ORDER — SODIUM CHLORIDE (PF) 0.9 % IJ SOLN
INTRAMUSCULAR | Status: DC | PRN
  Administered 2021-03-07: 60 mL

## 2021-03-07 MED ORDER — SENNA 8.6 MG PO TABS
1.0000 | ORAL_TABLET | Freq: Two times a day (BID) | ORAL | Status: DC
  Administered 2021-03-07 – 2021-03-08 (×2): 8.6 mg via ORAL
  Filled 2021-03-07 (×2): qty 1

## 2021-03-07 MED ORDER — FENTANYL CITRATE (PF) 100 MCG/2ML IJ SOLN
25.0000 ug | INTRAMUSCULAR | Status: DC | PRN
  Administered 2021-03-07 (×2): 50 ug via INTRAVENOUS

## 2021-03-07 MED ORDER — BISACODYL 10 MG RE SUPP: 10.0000 mg | Freq: Every day | RECTAL | Status: DC | PRN

## 2021-03-07 MED ORDER — DEXAMETHASONE SODIUM PHOSPHATE 10 MG/ML IJ SOLN
INTRAMUSCULAR | Status: DC | PRN
  Administered 2021-03-07: 10 mg via INTRAVENOUS

## 2021-03-07 MED ORDER — EZETIMIBE 10 MG PO TABS
10.0000 mg | ORAL_TABLET | Freq: Every day | ORAL | Status: DC
  Administered 2021-03-08: 10 mg via ORAL
  Filled 2021-03-07 (×2): qty 1

## 2021-03-07 MED ORDER — CEFAZOLIN SODIUM-DEXTROSE 2-4 GM/100ML-% IV SOLN
INTRAVENOUS | Status: AC
  Filled 2021-03-07: qty 100

## 2021-03-07 MED ORDER — ACETAMINOPHEN 10 MG/ML IV SOLN: 1000.0000 mg | Freq: Once | INTRAVENOUS | Status: DC | PRN

## 2021-03-07 MED ORDER — PROPOFOL 500 MG/50ML IV EMUL
INTRAVENOUS | Status: DC | PRN
  Administered 2021-03-07: 40 mg via INTRAVENOUS
  Administered 2021-03-07: 50 mg via INTRAVENOUS
  Administered 2021-03-07: 200 mg via INTRAVENOUS

## 2021-03-07 MED ORDER — ONDANSETRON HCL 4 MG/2ML IJ SOLN: 4.0000 mg | Freq: Four times a day (QID) | INTRAMUSCULAR | Status: DC | PRN

## 2021-03-07 MED ORDER — SODIUM CHLORIDE 0.9% FLUSH: 3.0000 mL | INTRAVENOUS | Status: DC | PRN

## 2021-03-07 MED ORDER — ROSUVASTATIN CALCIUM 20 MG PO TABS
40.0000 mg | ORAL_TABLET | Freq: Every day | ORAL | Status: DC
  Administered 2021-03-08: 40 mg via ORAL
  Filled 2021-03-07 (×2): qty 2
  Filled 2021-03-07: qty 4

## 2021-03-07 MED ORDER — FAMOTIDINE 20 MG PO TABS
ORAL_TABLET | ORAL | Status: AC
  Administered 2021-03-07: 20 mg via ORAL
  Filled 2021-03-07: qty 1

## 2021-03-07 MED ORDER — OXYCODONE HCL 5 MG PO TABS
5.0000 mg | ORAL_TABLET | ORAL | Status: DC | PRN
  Administered 2021-03-07: 5 mg via ORAL
  Filled 2021-03-07 (×3): qty 1

## 2021-03-07 MED ORDER — MORPHINE SULFATE (PF) 2 MG/ML IV SOLN: 2.0000 mg | INTRAVENOUS | Status: DC | PRN

## 2021-03-07 MED ORDER — FENTANYL CITRATE (PF) 100 MCG/2ML IJ SOLN
INTRAMUSCULAR | Status: DC | PRN
  Administered 2021-03-07: 100 ug via INTRAVENOUS

## 2021-03-07 MED ORDER — ACETAMINOPHEN 10 MG/ML IV SOLN
INTRAVENOUS | Status: DC | PRN
  Administered 2021-03-07: 1000 mg via INTRAVENOUS

## 2021-03-07 MED ORDER — HYDROMORPHONE HCL 1 MG/ML IJ SOLN
0.5000 mg | INTRAMUSCULAR | Status: DC | PRN
  Administered 2021-03-07: 0.5 mg via INTRAVENOUS

## 2021-03-07 MED ORDER — OXYCODONE HCL 5 MG PO TABS
10.0000 mg | ORAL_TABLET | ORAL | Status: DC | PRN
Start: 2021-03-07 — End: 2021-03-08
  Administered 2021-03-07 – 2021-03-08 (×3): 10 mg via ORAL
  Filled 2021-03-07 (×2): qty 2

## 2021-03-07 MED ORDER — VANCOMYCIN HCL 1000 MG IV SOLR
INTRAVENOUS | Status: DC | PRN
  Administered 2021-03-07: 1000 mg via TOPICAL

## 2021-03-07 MED ORDER — SUCCINYLCHOLINE CHLORIDE 200 MG/10ML IV SOSY
PREFILLED_SYRINGE | INTRAVENOUS | Status: DC | PRN
  Administered 2021-03-07: 100 mg via INTRAVENOUS

## 2021-03-07 MED ORDER — MIDAZOLAM HCL 2 MG/2ML IJ SOLN
INTRAMUSCULAR | Status: AC
  Filled 2021-03-07: qty 2

## 2021-03-07 MED ORDER — PHENYLEPHRINE HCL-NACL 20-0.9 MG/250ML-% IV SOLN
INTRAVENOUS | Status: DC | PRN
Start: 2021-03-07 — End: 2021-03-07
  Administered 2021-03-07: 50 ug/min via INTRAVENOUS

## 2021-03-07 MED ORDER — SODIUM CHLORIDE 0.9% FLUSH
3.0000 mL | Freq: Two times a day (BID) | INTRAVENOUS | Status: DC
  Administered 2021-03-07: 3 mL via INTRAVENOUS

## 2021-03-07 MED ORDER — ONDANSETRON HCL 4 MG/2ML IJ SOLN
INTRAMUSCULAR | Status: AC
  Filled 2021-03-07: qty 2

## 2021-03-07 MED ORDER — ACETAMINOPHEN 325 MG PO TABS: 650.0000 mg | ORAL_TABLET | ORAL | Status: DC | PRN

## 2021-03-07 MED ORDER — METHOCARBAMOL 500 MG PO TABS
500.0000 mg | ORAL_TABLET | Freq: Four times a day (QID) | ORAL | Status: DC
  Administered 2021-03-07 – 2021-03-08 (×3): 500 mg via ORAL
  Filled 2021-03-07 (×3): qty 1

## 2021-03-07 MED ORDER — REMIFENTANIL HCL 1 MG IV SOLR
INTRAVENOUS | Status: AC
  Filled 2021-03-07: qty 1000

## 2021-03-07 MED ORDER — VANCOMYCIN HCL 1000 MG IV SOLR
INTRAVENOUS | Status: AC
  Filled 2021-03-07: qty 20

## 2021-03-07 MED ORDER — HEMOSTATIC AGENTS (NO CHARGE) OPTIME
TOPICAL | Status: DC | PRN
  Administered 2021-03-07: 1 via TOPICAL

## 2021-03-07 MED ORDER — PHENYLEPHRINE HCL (PRESSORS) 10 MG/ML IV SOLN
INTRAVENOUS | Status: DC | PRN
  Administered 2021-03-07 (×2): 100 ug via INTRAVENOUS
  Administered 2021-03-07: 200 ug via INTRAVENOUS
  Administered 2021-03-07 (×2): 100 ug via INTRAVENOUS

## 2021-03-07 MED ORDER — ORAL CARE MOUTH RINSE: 15.0000 mL | Freq: Once | OROMUCOSAL | Status: AC

## 2021-03-07 MED ORDER — LACTATED RINGERS IV SOLN: INTRAVENOUS | Status: DC | PRN

## 2021-03-07 MED ORDER — BUPIVACAINE-EPINEPHRINE (PF) 0.5% -1:200000 IJ SOLN
INTRAMUSCULAR | Status: DC | PRN
  Administered 2021-03-07: 20 mL

## 2021-03-07 MED ORDER — LACTATED RINGERS IV SOLN: INTRAVENOUS | Status: DC

## 2021-03-07 MED ORDER — HYDROCHLOROTHIAZIDE 25 MG PO TABS
25.0000 mg | ORAL_TABLET | Freq: Every day | ORAL | Status: DC
  Administered 2021-03-08: 25 mg via ORAL
  Filled 2021-03-07: qty 1

## 2021-03-07 MED ORDER — IPRATROPIUM-ALBUTEROL 0.5-2.5 (3) MG/3ML IN SOLN: 3.0000 mL | Freq: Once | RESPIRATORY_TRACT | Status: DC | PRN

## 2021-03-07 MED ORDER — SODIUM CHLORIDE 0.9 % IV SOLN: 250.0000 mL | INTRAVENOUS | Status: DC

## 2021-03-07 MED ORDER — ALBUTEROL SULFATE (2.5 MG/3ML) 0.083% IN NEBU: 2.5000 mg | INHALATION_SOLUTION | Freq: Four times a day (QID) | RESPIRATORY_TRACT | Status: DC | PRN

## 2021-03-07 MED ORDER — BUPIVACAINE HCL (PF) 0.5 % IJ SOLN
INTRAMUSCULAR | Status: AC
  Filled 2021-03-07: qty 30

## 2021-03-07 MED ORDER — FLEET ENEMA 7-19 GM/118ML RE ENEM: 1.0000 | ENEMA | Freq: Once | RECTAL | Status: DC | PRN

## 2021-03-07 MED ORDER — DEXAMETHASONE SODIUM PHOSPHATE 10 MG/ML IJ SOLN
INTRAMUSCULAR | Status: AC
  Filled 2021-03-07: qty 1

## 2021-03-07 MED ORDER — PROPOFOL 10 MG/ML IV BOLUS
INTRAVENOUS | Status: DC | PRN
  Administered 2021-03-07: 125 ug/kg/min via INTRAVENOUS

## 2021-03-07 MED ORDER — CHLORHEXIDINE GLUCONATE 0.12 % MT SOLN
OROMUCOSAL | Status: AC
  Administered 2021-03-07: 15 mL via OROMUCOSAL
  Filled 2021-03-07: qty 15

## 2021-03-07 MED ORDER — PHENYLEPHRINE HCL (PRESSORS) 10 MG/ML IV SOLN
INTRAVENOUS | Status: AC
  Filled 2021-03-07: qty 1

## 2021-03-07 MED ORDER — SODIUM CHLORIDE FLUSH 0.9 % IV SOLN
INTRAVENOUS | Status: AC
  Filled 2021-03-07: qty 20

## 2021-03-07 MED ORDER — REMIFENTANIL HCL 1 MG IV SOLR
INTRAVENOUS | Status: DC | PRN
  Administered 2021-03-07: .1 ug/kg/min via INTRAVENOUS

## 2021-03-07 MED ORDER — SODIUM CHLORIDE 0.9 % IV SOLN: INTRAVENOUS | Status: DC

## 2021-03-07 MED ORDER — EMPAGLIFLOZIN 25 MG PO TABS
25.0000 mg | ORAL_TABLET | Freq: Every day | ORAL | Status: DC
  Administered 2021-03-08: 25 mg via ORAL
  Filled 2021-03-07 (×2): qty 1

## 2021-03-07 MED ORDER — ACETAMINOPHEN 10 MG/ML IV SOLN
INTRAVENOUS | Status: AC
  Filled 2021-03-07: qty 100

## 2021-03-07 MED ORDER — METFORMIN HCL 500 MG PO TABS
500.0000 mg | ORAL_TABLET | Freq: Two times a day (BID) | ORAL | Status: DC
  Administered 2021-03-07 – 2021-03-08 (×2): 500 mg via ORAL
  Filled 2021-03-07 (×2): qty 1

## 2021-03-07 MED ORDER — INSULIN ASPART 100 UNIT/ML IJ SOLN
0.0000 [IU] | Freq: Every day | INTRAMUSCULAR | Status: DC
  Administered 2021-03-07: 3 [IU] via SUBCUTANEOUS
  Filled 2021-03-07: qty 1

## 2021-03-07 MED ORDER — FENTANYL CITRATE (PF) 100 MCG/2ML IJ SOLN
INTRAMUSCULAR | Status: AC
  Filled 2021-03-07: qty 2

## 2021-03-07 MED ORDER — ONDANSETRON HCL 4 MG/2ML IJ SOLN: 4.0000 mg | Freq: Once | INTRAMUSCULAR | Status: DC | PRN

## 2021-03-07 MED ORDER — MIDAZOLAM HCL 2 MG/2ML IJ SOLN
INTRAMUSCULAR | Status: DC | PRN
  Administered 2021-03-07: 2 mg via INTRAVENOUS

## 2021-03-07 MED ORDER — POLYETHYLENE GLYCOL 3350 17 G PO PACK
17.0000 g | PACK | Freq: Every day | ORAL | Status: DC | PRN
Start: 2021-03-07 — End: 2021-03-08

## 2021-03-07 SURGICAL SUPPLY — 91 items
ADH SKN CLS APL DERMABOND .7 (GAUZE/BANDAGES/DRESSINGS) ×1
AGENT HMST KT MTR STRL THRMB (HEMOSTASIS) ×1
APL PRP STRL LF DISP 70% ISPRP (MISCELLANEOUS) ×2
APL SRG 60D 8 XTD TIP BNDBL (TIP)
BLADE BOVIE TIP EXT 4 (BLADE) ×2 IMPLANT
BUR NEURO DRILL SOFT 3.0X3.8M (BURR) ×2 IMPLANT
CHLORAPREP W/TINT 26 (MISCELLANEOUS) ×4 IMPLANT
CLIP NEUROVISION LG (CLIP) ×2 IMPLANT
CNTNR SPEC 2.5X3XGRAD LEK (MISCELLANEOUS) ×1
CONT SPEC 4OZ STER OR WHT (MISCELLANEOUS) ×1
CONT SPEC 4OZ STRL OR WHT (MISCELLANEOUS) ×1
CONTAINER SPEC 2.5X3XGRAD LEK (MISCELLANEOUS) ×1 IMPLANT
COUNTER NEEDLE 20/40 LG (NEEDLE) ×2 IMPLANT
COVER BACK TABLE REUSABLE LG (DRAPES) ×2 IMPLANT
COVER LIGHT HANDLE STERIS (MISCELLANEOUS) ×4 IMPLANT
CUP MEDICINE 2OZ PLAST GRAD ST (MISCELLANEOUS) ×2 IMPLANT
DERMABOND ADVANCED (GAUZE/BANDAGES/DRESSINGS) ×1
DERMABOND ADVANCED .7 DNX12 (GAUZE/BANDAGES/DRESSINGS) ×1 IMPLANT
DRAPE C-ARM 42X70 (DRAPES) ×4 IMPLANT
DRAPE C-ARMOR (DRAPES) ×2 IMPLANT
DRAPE LAPAROTOMY 100X77 ABD (DRAPES) ×2 IMPLANT
DRAPE MICROSCOPE SPINE 48X150 (DRAPES) ×2 IMPLANT
DRAPE SURG 17X11 SM STRL (DRAPES) ×2 IMPLANT
DRSG TEGADERM 4X4.75 (GAUZE/BANDAGES/DRESSINGS) ×2 IMPLANT
DRSG TEGADERM 8X12 (GAUZE/BANDAGES/DRESSINGS) ×2 IMPLANT
DURASEAL APPLICATOR TIP (TIP) IMPLANT
DURASEAL SPINE SEALANT 3ML (MISCELLANEOUS) IMPLANT
ELECT CAUTERY BLADE TIP 2.5 (TIP) ×2
ELECT EZSTD 165MM 6.5IN (MISCELLANEOUS) ×2
ELECT REM PT RETURN 9FT ADLT (ELECTROSURGICAL) ×2
ELECTRODE CAUTERY BLDE TIP 2.5 (TIP) ×1 IMPLANT
ELECTRODE EZSTD 165MM 6.5IN (MISCELLANEOUS) ×1 IMPLANT
ELECTRODE REM PT RTRN 9FT ADLT (ELECTROSURGICAL) ×1 IMPLANT
GAUZE 4X4 16PLY ~~LOC~~+RFID DBL (SPONGE) ×4 IMPLANT
GAUZE SPONGE 4X4 12PLY STRL (GAUZE/BANDAGES/DRESSINGS) ×2 IMPLANT
GLOVE SRG 8 PF TXTR STRL LF DI (GLOVE) ×1 IMPLANT
GLOVE SURG SYN 6.5 ES PF (GLOVE) ×4 IMPLANT
GLOVE SURG SYN 8.0 (GLOVE) ×4 IMPLANT
GLOVE SURG UNDER POLY LF SZ6.5 (GLOVE) ×4 IMPLANT
GLOVE SURG UNDER POLY LF SZ8 (GLOVE) ×2
GOWN SRG LRG LVL 4 IMPRV REINF (GOWNS) ×2 IMPLANT
GOWN STRL REIN LRG LVL4 (GOWNS) ×4
GOWN STRL REUS W/ TWL XL LVL3 (GOWN DISPOSABLE) ×2 IMPLANT
GOWN STRL REUS W/TWL XL LVL3 (GOWN DISPOSABLE) ×4
GRADUATE 1200CC STRL 31836 (MISCELLANEOUS) ×2 IMPLANT
HEMOVAC 400ML (MISCELLANEOUS) ×2
IMPL TLX20 9X11X26 20D (Cage) ×1 IMPLANT
K-WIRE NITOL BLUNT THRD (WIRE) ×8
KIT ACCESS MAXCESS MAS TLIF 2 (KITS) ×2 IMPLANT
KIT DRAIN HEMOVAC JP 7FR 400ML (MISCELLANEOUS) ×1 IMPLANT
KIT NEEDLE NVM5 EMG ELECT (KITS) ×2 IMPLANT
KIT SPINAL PRONEVIEW (KITS) ×2 IMPLANT
KIT TURNOVER KIT A (KITS) ×2 IMPLANT
KWIRE NITOL BLUNT THRD (WIRE) ×4 IMPLANT
MANIFOLD NEPTUNE II (INSTRUMENTS) ×2 IMPLANT
MARKER SKIN DUAL TIP RULER LAB (MISCELLANEOUS) ×4 IMPLANT
NDL SAFETY ECLIPSE 18X1.5 (NEEDLE) ×1 IMPLANT
NEEDLE HYPO 18GX1.5 SHARP (NEEDLE) ×2
NEEDLE HYPO 22GX1.5 SAFETY (NEEDLE) ×4 IMPLANT
NEEDLE I PASS (NEEDLE) ×2 IMPLANT
NS IRRIG 1000ML POUR BTL (IV SOLUTION) ×2 IMPLANT
PACK LAMINECTOMY NEURO (CUSTOM PROCEDURE TRAY) ×2 IMPLANT
PAD ARMBOARD 7.5X6 YLW CONV (MISCELLANEOUS) ×2 IMPLANT
PROBE BALL TIP NVM5 SNG USE (BALLOONS) ×2 IMPLANT
PUTTY DBM PROPEL MEDIUM (Putty) ×2 IMPLANT
REDUCTION EXT RELINE MAS MOD (Neuro Prosthesis/Implant) ×4 IMPLANT
ROD RELINE MAS LORD 5.5X45MM (Rod) ×2 IMPLANT
ROD RELINE MAS TI LORD 5.5X40 (Rod) ×2 IMPLANT
SCREW LOCK RELINE 5.5 TULIP (Screw) ×8 IMPLANT
SCREW RELINE MAS POLY 6.5X40MM (Screw) ×2 IMPLANT
SCREW RELINE RED 6.5X45MM POLY (Screw) ×2 IMPLANT
SCREW SHANK MAS MOD 6.5X40MM (Screw) ×2 IMPLANT
SCREW SHANK RELINE 6.5X45MM 2C (Screw) ×2 IMPLANT
SPONGE DRAIN TRACH 4X4 STRL 2S (GAUZE/BANDAGES/DRESSINGS) ×2 IMPLANT
SPONGE KITTNER 5P (MISCELLANEOUS) ×2 IMPLANT
SURGIFLO W/THROMBIN 8M KIT (HEMOSTASIS) ×2 IMPLANT
SUT ETHILON 3-0 FS-10 30 BLK (SUTURE) ×2
SUT NURALON 4 0 TR CR/8 (SUTURE) IMPLANT
SUT POLYSORB 2-0 5X18 GS-10 (SUTURE) ×6 IMPLANT
SUT VIC AB 0 CT1 18XCR BRD 8 (SUTURE) IMPLANT
SUT VIC AB 0 CT1 8-18 (SUTURE)
SUT VIC AB 3-0 SH 8-18 (SUTURE) IMPLANT
SUTURE EHLN 3-0 FS-10 30 BLK (SUTURE) ×1 IMPLANT
SYR 10ML LL (SYRINGE) ×4 IMPLANT
SYR 20ML LL LF (SYRINGE) ×2 IMPLANT
SYR 30ML LL (SYRINGE) ×4 IMPLANT
TLX20 IMPLANT 9X11X26 20D (Cage) ×2 IMPLANT
TOWEL OR 17X26 4PK STRL BLUE (TOWEL DISPOSABLE) ×4 IMPLANT
TRAY FOLEY MTR SLVR 16FR STAT (SET/KITS/TRAYS/PACK) ×2 IMPLANT
TUBING CONNECTING 10 (TUBING) ×2 IMPLANT
WATER STERILE IRR 500ML POUR (IV SOLUTION) ×2 IMPLANT

## 2021-03-07 NOTE — Anesthesia Preprocedure Evaluation (Signed)
Anesthesia Evaluation  Patient identified by MRN, date of birth, ID band Patient awake    Reviewed: Allergy & Precautions, NPO status , Patient's Chart, lab work & pertinent test results  History of Anesthesia Complications Negative for: history of anesthetic complications  Airway Mallampati: IV   Neck ROM: Full    Dental   Missing few molars:   Pulmonary asthma , sleep apnea , COPD, Current Smoker (1/2 ppd) and Patient abstained from smoking.,  COVID 12/2020, asymptomatic   Pulmonary exam normal breath sounds clear to auscultation       Cardiovascular hypertension, Normal cardiovascular exam Rhythm:Regular Rate:Normal  ECG 01/11/21: normal   Neuro/Psych PSYCHIATRIC DISORDERS Depression negative neurological ROS     GI/Hepatic negative GI ROS, (+) Hepatitis - (NASH)  Endo/Other  diabetesObesity   Renal/GU negative Renal ROS     Musculoskeletal   Abdominal   Peds  Hematology negative hematology ROS (+)   Anesthesia Other Findings   Reproductive/Obstetrics                             Anesthesia Physical Anesthesia Plan  ASA: 3  Anesthesia Plan: General   Post-op Pain Management:    Induction: Intravenous  PONV Risk Score and Plan: 2 and Ondansetron, Dexamethasone and Treatment may vary due to age or medical condition  Airway Management Planned: Oral ETT  Additional Equipment:   Intra-op Plan:   Post-operative Plan: Extubation in OR  Informed Consent: I have reviewed the patients History and Physical, chart, labs and discussed the procedure including the risks, benefits and alternatives for the proposed anesthesia with the patient or authorized representative who has indicated his/her understanding and acceptance.       Plan Discussed with: CRNA  Anesthesia Plan Comments:         Anesthesia Quick Evaluation

## 2021-03-07 NOTE — Progress Notes (Signed)
Inpatient Diabetes Program Recommendations  AACE/ADA: New Consensus Statement on Inpatient Glycemic Control (2015)  Target Ranges:  Prepandial:   less than 140 mg/dL      Peak postprandial:   less than 180 mg/dL (1-2 hours)      Critically ill patients:  140 - 180 mg/dL   Lab Results  Component Value Date   GLUCAP 217 (H) 03/07/2021   HGBA1C 8.5 (H) 11/12/2020    Review of Glycemic Control Results for SATIN, BOAL (MRN [id number]) as of 03/07/2021 14:07  Ref. Range 03/07/2021 06:15 03/07/2021 12:05  Glucose-Capillary Latest Ref Range: 70 - 99 mg/dL 188 (H) 217 (H)   Diabetes history: DM 2 Outpatient Diabetes medications: Jardiance 10 mg Daily, Metformin 886 mg bid, Trulicity 3 mg Weekly Current orders for Inpatient glycemic control:  Jardiance 10 mg Daily metformin 500 mg bid Novolog 0-15 units tid + hs  Note: Pt received decadron 10 mg during surgery  Inpatient Diabetes Program Recommendations:    - agree with current inpt regimen for glucose control. Will follow glucose trends.  Thanks,  Tama Headings RN, MSN, BC-ADM Inpatient Diabetes Coordinator Team Pager [phone] (8a-5p)

## 2021-03-07 NOTE — Anesthesia Procedure Notes (Signed)
Procedure Name: Intubation Date/Time: 03/07/2021 7:26 AM Performed by: Natasha Mead, CRNA Pre-anesthesia Checklist: Patient identified, Emergency Drugs available, Suction available and Patient being monitored Patient Re-evaluated:Patient Re-evaluated prior to induction Oxygen Delivery Method: Circle system utilized Preoxygenation: Pre-oxygenation with 100% oxygen Induction Type: IV induction Ventilation: Mask ventilation without difficulty Laryngoscope Size: Miller and 2 Grade View: Grade II Tube type: Oral Tube size: 6.5 mm Number of attempts: 1 Airway Equipment and Method: Stylet and Oral airway Placement Confirmation: ETT inserted through vocal cords under direct vision, positive ETCO2 and breath sounds checked- equal and bilateral Secured at: 20 cm Tube secured with: Tape Dental Injury: Teeth and Oropharynx as per pre-operative assessment

## 2021-03-07 NOTE — Progress Notes (Signed)
[facility] called to check this pt's. Home CPAP machine.

## 2021-03-07 NOTE — Interval H&P Note (Signed)
History and Physical Interval Note:  03/07/2021 6:43 AM  Brenda Farmer  has presented today for surgery, with the diagnosis of lumbar radiculopathy m54.16 lumbar stenosis m48.061.  The various methods of treatment have been discussed with the patient and family. After consideration of risks, benefits and other options for treatment, the patient has consented to  Procedure(s): L4-5 MAXIMUM ACCESS (MAS) TRANSFORAMINAL LUMBAR INTERBODY FUSION (TLIF) 1 LEVEL (N/A) as a surgical intervention.  The patient's history has been reviewed, patient examined, no change in status, stable for surgery.  I have reviewed the patient's chart and labs.  Questions were answered to the patient's satisfaction.     Deetta Perla

## 2021-03-07 NOTE — Transfer of Care (Signed)
Immediate Anesthesia Transfer of Care Note  Patient: Brenda Farmer  Procedure(s) Performed: L4-5 MAXIMUM ACCESS (MAS) TRANSFORAMINAL LUMBAR INTERBODY FUSION (TLIF) 1 LEVEL  Patient Location: PACU  Anesthesia Type:General  Level of Consciousness: awake, alert  and oriented  Airway & Oxygen Therapy: Patient Spontanous Breathing and Patient connected to face mask oxygen  Post-op Assessment: Report given to RN and Post -op Vital signs reviewed and stable  Post vital signs: stable  Last Vitals:  Vitals Value Taken Time  BP 111/42 03/07/21 1200  Temp    Pulse 96 03/07/21 1208  Resp    SpO2 95 % 03/07/21 1208  Vitals shown include unvalidated device data.  Last Pain:  Vitals:   03/07/21 0610  TempSrc: Temporal  PainSc: 1          Complications: No notable events documented.

## 2021-03-07 NOTE — Discharge Instructions (Signed)
NEUROSURGERY DISCHARGE INSTRUCTIONS  Admission diagnosis: lumbar radiculopathy m54.16 lumbar stenosis m48.061  Operative procedure: L4-5 TLIF   What to do after you leave the hospital:  Recommended diet: diabetic diet. Increase protein intake to promote wound healing.  Recommended activity: no lifting, driving, or strenuous exercise for 4 weeks .You should walk multiple times per day  Special Instructions  No straining, no heavy lifting > 10lbs x 4 weeks.  Keep incision area clean and dry. May shower in 2 days. No baths or pools for 6 weeks.   Your skin is closed with adhesive. This will start to come off on its own in 7-10 days  Please take pain medications as directed. Take a stool softener if on pain medications   Please Report any of the following: Nausea or Vomiting, Temperature is greater than 101.65F (38.1C) degrees, Dizziness, Abdominal Pain, Difficulty Breathing or Shortness of Breath, Inability to Eat, drink Fluids, or Take medications, Bleeding, swelling, or drainage from surgical incision sites, New numbness or weakness, and Bowel or bladder dysfunction to the neurosurgeon on call at [phone]  Additional Follow up appointments Please follow up with Cooper Render PA-C in [place] clinic as scheduled in 2-3 weeks   Please see below for scheduled appointments:  Future Appointments  Date Time Provider [place]  04/01/2021  3:30 PM Leone Haven, MD [facility]

## 2021-03-07 NOTE — Anesthesia Postprocedure Evaluation (Signed)
Anesthesia Post Note  Patient: Brenda Farmer  Procedure(s) Performed: L4-5 MAXIMUM ACCESS (MAS) TRANSFORAMINAL LUMBAR INTERBODY FUSION (TLIF) 1 LEVEL  Patient location during evaluation: PACU Anesthesia Type: General Level of consciousness: awake and alert, oriented and patient cooperative Pain management: pain level controlled Vital Signs Assessment: post-procedure vital signs reviewed and stable Respiratory status: spontaneous breathing, nonlabored ventilation and respiratory function stable Cardiovascular status: blood pressure returned to baseline and stable Postop Assessment: adequate PO intake Anesthetic complications: no   No notable events documented.   Last Vitals:  Vitals:   03/07/21 1322 03/07/21 1339  BP: 117/66 122/65  Pulse: 91 92  Resp: 14 18  Temp: (!) 36.3 C 36.8 C  SpO2: 92% 95%    Last Pain:  Vitals:   03/07/21 1339  TempSrc: Oral  PainSc:     LLE Motor Response: Purposeful movement (03/07/21 1407) LLE Sensation: Full sensation (03/07/21 1407)   RLE Sensation: Full sensation (03/07/21 1407)      Darrin Nipper

## 2021-03-07 NOTE — H&P (Signed)
Brenda Farmer is an 55 y.o. female.   Chief Complaint: Leg pain HPI: Brenda Farmer returns from her last visit with in 2021 where she was having more right leg pain. She states that this time that she is having pain in the back and this goes into both legs, predominantly on the right. There is occasional numbness and tingling. She does comment on some persistent numbness in the right leg. This will go from the lateral side of the thigh down to the lateral side of the foot. She does feel that this has been worsening over the past few months. She has been doing home exercises. She has not had any recent injections. She does feel like the pain will get somewhat better with walking but then worsened again after prolonged walking. She does require bending forward to relieve some of the pain. She does have an MRI scheduled for this afternoon.  Past Medical History:  Diagnosis Date   Arthritis    Chicken pox    Diabetes mellitus without complication ([place])    Diabetic eye exam ([place])    History of 2019 novel coronavirus disease (COVID-19) 01/13/2021   History of mammogram 08/16/2012   neg   History of Papanicolaou smear of cervix 06/21/2012   neg/neg   Hyperlipidemia    Hypertension    Hypothyroidism (acquired)    Obesity    Sleep apnea    Thyroid disease     Past Surgical History:  Procedure Laterality Date   ABDOMINAL HYSTERECTOMY  2012   Cervix & ovaries intact   APPENDECTOMY     CESAREAN SECTION     COLONOSCOPY WITH PROPOFOL N/A 11/05/2015   Procedure: COLONOSCOPY WITH PROPOFOL;  Surgeon: Lollie Sails, MD;  Location: [facility] ENDOSCOPY;  Service: Endoscopy;  Laterality: N/A;    Family History  Problem Relation Age of Onset   Arthritis Mother    Hyperlipidemia Mother    Cervical cancer Mother 59       Malignant   Alcohol abuse Father    Hyperlipidemia Father    Heart disease Father    Hypertension Father    Stroke Maternal Grandmother    Diabetes Maternal Grandmother     Arthritis Maternal Grandfather    Diabetes Maternal Grandfather        DM Type 2   Asthma Paternal Grandmother    Heart disease Paternal Grandmother    Lung cancer Paternal Grandmother        Malignant   Prostate cancer Paternal Grandfather    Hyperlipidemia Paternal Grandfather    Hypertension Paternal Grandfather    Heart disease Paternal Grandfather    Lung cancer Paternal Grandfather    Breast cancer Neg Hx    Social History:  reports that she has been smoking cigarettes. She has a 21.00 pack-year smoking history. She has never used smokeless tobacco. She reports current alcohol use. She reports that she does not use drugs.  Allergies:  Allergies  Allergen Reactions   Codeine Itching    Medications Prior to Admission  Medication Sig Dispense Refill   albuterol (VENTOLIN HFA) 108 (90 Base) MCG/ACT inhaler Inhale 1 puff into the lungs every 6 (six) hours as needed for wheezing or shortness of breath. 8 g 0   diclofenac (VOLTAREN) 75 MG EC tablet TAKE 1 TABLET TWICE A DAY AS NEEDED FOR MILD PAIN (Patient taking differently: Take 75 mg by mouth 2 (two) times daily.) 180 tablet 3   Dulaglutide (TRULICITY) 3 TL/5.7WI SOPN Inject 3 mg  as directed once a week. 6 mL 1   ezetimibe (ZETIA) 10 MG tablet TAKE 1 TABLET DAILY (Patient taking differently: Take 10 mg by mouth daily.) 90 tablet 3   hydrochlorothiazide (HYDRODIURIL) 25 MG tablet TAKE 1 TABLET DAILY (Patient taking differently: Take 25 mg by mouth daily.) 90 tablet 3   levothyroxine (SYNTHROID) 125 MCG tablet TAKE 1 TABLET DAILY (Patient taking differently: Take 125 mcg by mouth daily before breakfast.) 90 tablet 3   lisinopril (ZESTRIL) 20 MG tablet TAKE 1 TABLET DAILY (Patient taking differently: Take 20 mg by mouth daily.) 90 tablet 3   metFORMIN (GLUCOPHAGE) 500 MG tablet TAKE 1 TABLET TWICE A DAY WITH MEALS (Patient taking differently: Take 500 mg by mouth 2 (two) times daily with a meal.) 180 tablet 3   Multiple Vitamin  (MULTIVITAMIN) tablet Take 1 tablet by mouth daily.     rosuvastatin (CRESTOR) 40 MG tablet TAKE 1 TABLET DAILY (Patient taking differently: Take 40 mg by mouth daily.) 90 tablet 3   benzonatate (TESSALON) 200 MG capsule Take 1 capsule (200 mg total) by mouth 3 (three) times daily as needed for cough. (Patient not taking: No sig reported) 30 capsule 0   blood glucose meter kit and supplies KIT Dispense based on patient and insurance preference. Patient requests one touch verio flex system and test strips. Check three times daily. Dx code E11.9. 1 each 0   Blood Glucose Monitoring Suppl (CONTOUR BLOOD GLUCOSE SYSTEM) w/Device KIT Used to check blood glucose 3 times daily, diagnosis code E11.9 1 each 0   glucose blood (CONTOUR TEST) test strip Use as instructed 100 each 12   glucose blood test strip once daily.     JARDIANCE 25 MG TABS tablet TAKE 1 TABLET DAILY 90 tablet 3    Results for orders placed or performed during the hospital encounter of 03/07/21 (from the past 48 hour(s))  Glucose, capillary     Status: Abnormal   Collection Time: 03/07/21  6:15 AM  Result Value Ref Range   Glucose-Capillary 188 (H) 70 - 99 mg/dL    Comment: Glucose reference range applies only to samples taken after fasting for at least 8 hours.   No results found.  Review of Systems General ROS: Negative Respiratory ROS: Negative Cardiovascular ROS: Negative Gastrointestinal ROS: Negative Genito-Urinary ROS: Negative Musculoskeletal ROS: Positive for back pain Neurological ROS: Positive for leg pain Dermatological ROS: Negative There were no vitals taken for this visit. Physical Exam  General appearance: Alert, cooperative, in no acute distress  Neurologic exam:  Mental status: alertness: alert, affect: normal Speech: fluent and clear Motor:strength symmetric 5/5 in bilateral lower extremities Sensory: intact to light touch in bilateral lower extremities Gait: normal    MRI Lumbar Spine: 1. L4-L5  unchanged moderate spinal canal stenosis and right neural foraminal stenosis due to combination of disc bulge and superimposed right foraminal/extraforaminal protrusion. There is mass effect on the right L5 nerve root in the lateral recess. Correlate for right L5 radiculopathy.  Assessment/Plan Proceed with L4/5 TLIF  Deetta Perla, MD 03/07/2021, 6:41 AM

## 2021-03-07 NOTE — Op Note (Signed)
SURGERY DATE:  03/07/2021   PRE-OP DIAGNOSIS: Lumbar Stenosis with Lumbar Radiculopathy(m48.062)    POST-OP DIAGNOSIS: Post-Op Diagnosis Codes:  Lumbar Stenosis with Lumbar Radiculopathy (m48.062)    Procedure(s) with comments:  Right L4/5 Transforaminal Interbody Fusion, Facetectomy, Laminectomy with Discectomy  Pedicle Screw Fixation with Rod Placement  Autograft and Allograft    ARTHRODESIS, COMBINED POSTERIOR OR POSTEROLATERAL TECHNIQUE W/POSTERIOR INTERBODY TECHNIQUE INCL LAMINECTOMY AND/OR DISCECTOMY SUFFICIENT TO PREPARE INTERSPACE, SINGLE INTERSPACE AND SEGMENT; LUMBAR---L4/5 TLIF WITH LAMINECTOMIES  POSTERIOR NONSEGMENTAL INSTRUMENT, TECHNI, PEDICLE FIX 1 INTERSPACE, ATANTOAXIAL TRANSARTIC SCREW FIX, SUBLAMINAR WIRING C1, FACET SCREW FIX) (LIST IN ADDITION CODE FOR PRIMARY PROCEDURE)  INSERTION INTERBODY BIOMECHANICAL DEVICE WITH INTEGRAL ANTERIOR INSTRUMENTATION, TO INTERVERTEBRAL DISC SPACE IN CONJUNCTION INTERBODY ARTHRODESIS, EACH INTERSPACE (LIST CODE FOR PRIMARY PROCEDURE)  ALLOGRAFT, MORSELIZED, OR PLACEMENT OF OSTEOPROMOTIVE MATERIAL, FOR SPINE SURGERY ONLY (LIST IN ADDITION TO PRIMARY PROCEDURE)  AUTOGRAFT FOR SPINE SURGERY ONLY (INCL HARVESTING GRAFT); LOCAL (EG, RIBS, SPINOUS PROCESS, OR LAMINAR FRAGMENT) OBTAINED FROM SAME INCISION (LIST IN ADDITION TO PRIMARY PROCEDURE)    SURGEON:  * Malen Gauze, MD  Cooper Render,  PA Assistant    ANESTHESIA: General    OPERATIVE FINDINGS: Spondylolisthesis and facet hypertrophy at L4/5 with right foraminal stenosis    OPERATIVE REPORT:  Indication: Brenda Farmer presented to the clinic on 7/26 with ongoing right leg pain and back pain. She was found on imaging to have stenosis and sponylolisthesis at L4/5 on MRI.\ Given previously failed conservative management including OTC medications, precription medications, steroid injections, and physical therapy, we discussed need for removal of facet for stenosis and stabilization given  spondylolisthesis. The risks of surgery were explained to include hematoma, infection, damage to nerve roots, CSF leak, weakness, numbness, pain, failure of fusion, need for future surgery, heart attack, and stroke. She elected to proceed with surgery for symptom relief.      Procedure  The patient was brought to the OR after informed consent was obtained. She was given general anesthesia and intubated by the anesthesia service. Vascular access lines and a foley catheter were placed. Neuromonitoring electrodes were placed for EMG in lower extremities. The patient was then placed prone on a Jackson table ensuring all pressure points were padded. A time-out was performed per protocol.    The patient was sterilely prepped and draped. The fluoroscopy was used to identify the L4/5 level and the pedicles were marked. Incisions were planned in paramedian fashion using fluoroscopy to identify the pedicle line.  Local anesthetic with epinephrine was instilled into the planned incisions. The skin was opened sharply and the dissection taken to the fascia. This was incised with cautery.    Using fluoroscopy, an entry point as placed at the pedicle at L4 and a Jamshidi was advanced using monitoring to a depth of 3 cm ensuring we were past the pedicle/body interface. There was no stimulation below 36mA on any screw. Once the Jamshidi was placed, a k-wire was placed through this and then a 5/5 mm tap used with monitoring to prepare for the screw. Once this was performed at the L4 and L5 levels bilaterally, the screws were placed. We placed 6.5 mm x 45 mm screws at L4 and 6.76mm x 7mm at L5. We confirmed placement with fluoroscopy and then stimulated and all screws stimulated above 58mA.    The screws on the right had the retractor blades attached and the TLIF adaptor was attached.  Next, the lamina of L4 was identified and the space distracted. The  muscle was cleared from the facet and lamina. Next, the drill was used to  remove the lamina and working to the inferior facet of L4. The ligament was identified beneath this. Next, The superior facet of L5 was removed and then the ligament removed beneath this to expose the dura medially and the exiting L4 nerve root. The disc space was identified and all ligament overlying the traversing nerve root removed. The disc space was coagulated and then entered sharply.   The dura was retracted medially and the L4 and L5 nerve root protected. The disc space was cleared with combination of shavers and curettes. Once clear, a 9-63mm expandable graft was placed within the disc space and placed in the midline anteriorly to promote arthrodesis. Fluoroscopy confirmed placement.  Bone graft was then placed within the disc space and interbody graft.    The retractors were then removed and the right side screws advanced. Next, a  45 mm rod was placed on right and 74mm rod on the left and set screws were placed and torqued bilaterally. Fluoroscopy confirmed good placement of the rods and adequate length.     The wounds were irrigated with saline and hemostasis was achieved.  A drain was exited through the skin on the right. The fascia was closed with 0 vicryl bilaterally. Vancomycin powder was placed. Long acting anesthetic was placed into the fascia. Next, multiple subcutaneous and dermal layers were closed with 2-0 vicryl until the epidermis was well approximated. The skin was closed with Dermabond. The drain was secured with suture.    The patient was returned to supine position and extubated by the anesthesia service. The patient was then taken to the PACU for post-operative care where she was moving extremities symmetrically. I discussed the results with the family and all questions were answered.    ESTIMATED BLOOD LOSS:  200 cc   IMPLANT   SCREW SHANK RELINE 6.5X45MM 2C - [id number]  Inventory Item: SCREW SHANK RELINE 6.5X45MM 2C Serial no.:  Model/Cat no.: [id number]  Implant name:  Nydia Bouton RELINE 6.5X45MM 2C - [id number] Laterality: N/A Area: Lumbar Level 4-5  Manufacturer: [employer] Date of Manufacture:    Action: Implanted Number Used: 1   Device Identifier:  Device Identifier Type:     SCREW SHANK MAS MOD 6.5X40MM - [id number]  Inventory Item: SCREW SHANK MAS MOD 6.5X40MM Serial no.:  Model/Cat no.: [id number]  Implant name: Nydia Bouton MAS MOD 6.5X40MM - [id number] Laterality: N/A Area: Lumbar Level 4-5  Manufacturer: [employer] Date of Manufacture:    Action: Implanted Number Used: 1   Device Identifier:  Device Identifier Type:     REDUCTION EXT RELINE MAS MOD - [id number]  Inventory Item: REDUCTION EXT RELINE MAS MOD Serial no.:  Model/Cat no.: [id number]  Implant name: REDUCTION EXT RELINE MAS MOD - [id number] Laterality: N/A Area: Lumbar Level 4-5  Manufacturer: [employer] Date of Manufacture:    Action: Implanted Number Used: 2   Device Identifier:  Device Identifier Type:     SCREW RELINE RED 6.5X45MM POLY - [id number]  Inventory Item: SCREW RELINE RED 6.5X45MM POLY Serial no.:  Model/Cat no.: [id number]  Implant name: SCREW RELINE RED 6.5X45MM POLY - [id number] Laterality: N/A Area: Lumbar Level 4-5  Manufacturer: [employer] Date of Manufacture:    Action: Implanted Number Used: 1   Device Identifier:  Device Identifier Type:     SCREW RELINE MAS POLY 6.5X40MM - [id number]  Inventory Item: SCREW RELINE MAS POLY 6.5X40MM Serial no.:  Model/Cat no.: [id number]  Implant name: SCREW RELINE MAS POLY 6.5X40MM - [id number] Laterality: N/A Area: Lumbar Level 4-5  Manufacturer: [employer] Date of Manufacture:    Action: Implanted Number Used: 1   Device Identifier:  Device Identifier Type:     PUTTY PROPEL MEDIUM - [id number]  Inventory Item: PUTTY PROPEL MEDIUM Serial no.:  Model/Cat no.: [id number]  Implant name: Knox Royalty MEDIUM - [id number] Laterality: N/A Area: Lumbar Level 4-5  Manufacturer: [employer] Date of Manufacture:    Action: Implanted  Number Used: 1   Device Identifier:  Device Identifier Type:     TLX20 IMPLANT [id number] 20D - [id number]  Inventory Item: TLX20 IMPLANT [id number] 20D Serial no.:  Model/Cat no.: [id number]  Implant name: TLX20 IMPLANT [id number] 20D - [id number] Laterality: N/A Area: Lumbar Level 4-5  Manufacturer: [employer] Date of Manufacture:    Action: Implanted Number Used: 1   Device Identifier:  Device Identifier Type:     ROD RELINE MAS TI LORD 5.5X40 - [id number]  Inventory Item: ROD RELINE MAS TI LORD 5.5X40 Serial no.:  Model/Cat no.: [id number]  Implant name: ROD RELINE MAS TI LORD 5.5X40 - [id number] Laterality: N/A Area: Lumbar Level 4-5  Manufacturer: [employer] Date of Manufacture:    Action: Implanted Number Used: 1   Device Identifier:  Device Identifier Type:     ROD RELINE MAS LORD 5.5X45MM - [id number]  Inventory Item: ROD RELINE MAS LORD 5.5X45MM Serial no.:  Model/Cat no.: [id number]  Implant name: ROD RELINE MAS LORD 5.5X45MM - [id number] Laterality: N/A Area: Lumbar Level 4-5  Manufacturer: [employer] Date of Manufacture:    Action: Implanted Number Used: 1   Device Identifier:  Device Identifier Type:     SCREW LOCK RELINE 5.5 [place] - [id number]  Inventory Item: SCREW LOCK RELINE 5.5 [place] Serial no.:  Model/Cat no.: [id number]  Implant name: SCREW LOCK RELINE 5.5 [place] - [id number] Laterality: N/A Area: Lumbar Level 4-5  Manufacturer: [employer] Date of Manufacture:    Action: Implanted Number Used: 4   Device Identifier:  Device Identifier Type:       I performed the case in its entirety with assistance of PA, Ernestene Kiel, [place]

## 2021-03-08 ENCOUNTER — Encounter: Payer: Self-pay | Admitting: Neurosurgery

## 2021-03-08 LAB — GLUCOSE, CAPILLARY: Glucose-Capillary: 138 mg/dL — ABNORMAL HIGH (ref 70–99)

## 2021-03-08 MED ORDER — SENNA 8.6 MG PO TABS: 1.0000 | ORAL_TABLET | Freq: Two times a day (BID) | ORAL | 0 refills | Status: DC

## 2021-03-08 MED ORDER — OXYCODONE HCL 5 MG PO TABS: 5.0000 mg | ORAL_TABLET | ORAL | 0 refills | Status: AC | PRN

## 2021-03-08 MED ORDER — METHOCARBAMOL 500 MG PO TABS
500.0000 mg | ORAL_TABLET | Freq: Four times a day (QID) | ORAL | 0 refills | Status: DC
Start: 2021-03-08 — End: 2021-07-15

## 2021-03-08 MED ORDER — CELECOXIB 50 MG PO CAPS: 50.0000 mg | ORAL_CAPSULE | Freq: Two times a day (BID) | ORAL | 0 refills | Status: DC

## 2021-03-08 NOTE — Progress Notes (Addendum)
Occupational Therapy Evaluation Patient Details Name: Brenda Farmer MRN: [id number] DOB: 10-29-1965 Today's Date: 03/08/2021   History of Present Illness Pt is 55 y.o. female s/p L4-L5 Transforaminal Lumbar Fusion.  Pt has PMH of asthma, sleep apnea, COPD, current smoker, HTN, depression, and diabetes.   Clinical Impression   Brenda Farmer was seen for OT evaluation this date. Prior to hospital admission, pt was independent in ADLs with assistance for IADLs from her husband. Pt lives with spouse. Currently pt demonstrates impairments as described below (See OT problem list) which functionally limit her ability to perform ADL/self-care tasks. Pt currently requires MIN A + VCs for new technique while log rolling, supine<>sit. MIN A + RW + VCs toilet t/f, regular height toilet. After MAX VCs and trying different techniques for access, pt required MOD A for perihygeine. Pt MOD I + RW for hand hygeine, standing sinkside ~2 min. Pt would benefit from skilled OT services to address noted impairments and functional limitations (see below for any additional details) in order to maximize safety and independence while minimizing falls risk and caregiver burden. Upon hospital discharge, recommend no OT follow up.     Recommendations for follow up therapy are one component of a multi-disciplinary discharge planning process, led by the attending physician.  Recommendations may be updated based on patient status, additional functional criteria and insurance authorization.   Follow Up Recommendations  No OT follow up    Equipment Recommendations  Other (comment) (RW)    Recommendations for Other Services       Precautions / Restrictions Precautions Precautions: Back Required Braces or Orthoses: Spinal Brace Spinal Brace: Applied in sitting position Restrictions Weight Bearing Restrictions: No      Mobility Bed Mobility Overal bed mobility: Modified Independent Bed Mobility: Supine to Sit;Sit  to Supine               Transfers Overall transfer level: Needs assistance Equipment used: Rolling walker (2 wheeled) Transfers: Sit to/from Stand Sit to Stand: Supervision              Balance Overall balance assessment: Needs assistance Sitting-balance support: Feet supported;No upper extremity supported Sitting balance-Leahy Scale: Normal     Standing balance support: No upper extremity supported;During functional activity Standing balance-Leahy Scale: Good                             ADL either performed or assessed with clinical judgement   ADL Overall ADL's : Needs assistance/impaired                                       General ADL Comments: Pt requires MIN A + VCs for new technique while log rolling, supine<>sit. MIN A + RW + VCs toilet t/f, regular height toilet. After MAX VCs and trying different techniques for access, pt required MOD A for perihygeine. Pt MOD I + RW for hand hygeine, standing sinkside ~2 min.      Pertinent Vitals/Pain Pain Assessment: Faces Faces Pain Scale: Hurts a little bit Pain Location: back Pain Descriptors / Indicators: Discomfort Pain Intervention(s): Limited activity within patient's tolerance     Hand Dominance     Extremity/Trunk Assessment Upper Extremity Assessment Upper Extremity Assessment: Overall WFL for tasks assessed   Lower Extremity Assessment Lower Extremity Assessment: Overall WFL for tasks assessed  Communication Communication Communication: No difficulties   Cognition Arousal/Alertness: Awake/alert Behavior During Therapy: WFL for tasks assessed/performed Overall Cognitive Status: Within Functional Limits for tasks assessed                                     General Comments       Exercises Exercises: Other exercises Other Exercises Other Exercises: Pt educated re: role of OT, don/doff brace, back pcns, importance of mvmt for functional  mobility. Log roll<>sit, sit<>stand + RW, toilet t/f, perihygiene, handwashing.   Shoulder Instructions      Home Living Family/patient expects to be discharged to:: Private residence Living Arrangements: Spouse/significant other Available Help at Discharge: Family Type of Home: House Home Access: Level entry     Home Layout: One level (has one step inside with grab bars installed pre surgery)     Bathroom Shower/Tub: Occupational psychologist: Handicapped height     Home Equipment: None          Prior Functioning/Environment Level of Independence: Independent        Comments: Independent in ADLs, husband assisted with IADLs        OT Problem List: Decreased activity tolerance;Impaired balance (sitting and/or standing);Decreased knowledge of precautions      OT Treatment/Interventions: Self-care/ADL training;Energy conservation;DME and/or AE instruction;Therapeutic activities    OT Goals(Current goals can be found in the care plan section) Acute Rehab OT Goals Patient Stated Goal: to get better OT Goal Formulation: With patient/family Time For Goal Achievement: 03/22/21 Potential to Achieve Goals: Good ADL Goals Pt Will Perform Lower Body Dressing: with supervision;sit to/from stand Pt Will Perform Toileting - Clothing Manipulation and hygiene: with supervision;sit to/from stand Additional ADL Goal #1: Pt will maintain back precautions during ADL tasks with minimal cueing.  OT Frequency: Min 1X/week   Barriers to D/C:            Co-evaluation              AM-PAC OT "6 Clicks" Daily Activity     Outcome Measure Help from another person eating meals?: None Help from another person taking care of personal grooming?: A Little Help from another person toileting, which includes using toliet, bedpan, or urinal?: A Lot Help from another person bathing (including washing, rinsing, drying)?: A Little Help from another person to put on and taking off  regular upper body clothing?: A Little Help from another person to put on and taking off regular lower body clothing?: A Little 6 Click Score: 18   End of Session Equipment Utilized During Treatment: Rolling walker  Activity Tolerance: Patient tolerated treatment well Patient left: in bed;with family/visitor present  OT Visit Diagnosis: Unsteadiness on feet (R26.81)                Time: 9371-6967 OT Time Calculation (min): 29 min Charges:  OT General Charges $OT Visit: 1 Visit OT Evaluation $OT Eval Moderate Complexity: 1 Mod OT Treatments $Self Care/Home Management : 8-22 mins  Nino Glow, Markus Daft 03/08/2021, 11:59 AM

## 2021-03-08 NOTE — Progress Notes (Signed)
DISCHARGE NOTE:  Pt given discharge instructions. Pt verbalized understanding. Walker and brace sent with pt. Pt wheeled out by staff. Husband providing transportation.

## 2021-03-08 NOTE — Progress Notes (Signed)
    Attending Progress Note  History: JAQUESHA BOROFF is s/p L4-5 TLIF for lumbar spondylosis and radiculopathy  POD#1: Acute event overnight.  Patient's pain is well controlled on oral medications and she is eager to discharge home.  She states that she has been able to ambulate and transfer.  Physical Exam: Vitals:   03/08/21 0409 03/08/21 0722  BP: (!) 116/58 122/71  Pulse: 84 80  Resp: 16 14  Temp: 98 F (36.7 C) (!) 97.5 F (36.4 C)  SpO2: 95% 98%    AA Ox3 CNI  Strength:5/5 throughout BLE Incision c/d/I with Dermabond in place  Data:  No results for input(s): NA, K, CL, CO2, BUN, CREATININE, LABGLOM, GLUCOSE, CALCIUM in the last 168 hours. No results for input(s): AST, ALT, ALKPHOS in the last 168 hours.  Invalid input(s): TBILI   No results for input(s): WBC, HGB, HCT, PLT in the last 168 hours. Recent Labs  Lab 03/07/21 0629  INR 0.9         Other tests/results: none   Assessment/Plan:  Aaliah A Pinho is s/p L4-5 TLIF.  - mobilize - pain control - DVT prophylaxis - PTOT; patient declined home health  Cooper Render PA-C Department of Neurosurgery

## 2021-03-08 NOTE — Progress Notes (Signed)
Met with the patient and her husband in the room to discuss DC plan and needs She lives at home with her husband and has a lot of support She has transportation and can afford her meds She has a raised toilet at home, she declines a 3 in 1 due to being "too short" to use it She will need a rolling walker and it will be delivered to the room prior to DC by [employer] She stated she will not need HH PT She has no additional needs

## 2021-03-08 NOTE — Discharge Summary (Signed)
Physician Discharge Summary  Patient ID: Brenda Farmer MRN: [id number] DOB/AGE: 56-Sep-1967 56 y.o.  Admit date: 03/07/2021 Discharge date: 03/08/2021  Admission Diagnoses: Lumbar radiculopathy  Discharge Diagnoses:  Active Problems:   Lumbar radiculopathy   Discharged Condition: good  Hospital Course: LAQUITHA HESLIN is a 55 y.o presenting with lumbar radiculopathy. Her inter-operative course was uncomplicated and she was admitted overnight for observation. Her pain was well controlled with PO medications. Her drain output was monitored and the drain was removed once the output decreased to an acceptable level. She was seen by PT and deemed appropriate for discharge home.  Patient declined home health therapy.  She was discharged after ambulating, urinating, and tolerating PO intake.   Consults: None  Significant Diagnostic Studies: none  Treatments: surgery: L4-5 TLIF. See separately dictated operative report for further details  Discharge Exam: Blood pressure 122/71, pulse 80, temperature (!) 97.5 F (36.4 C), resp. rate 14, height 4' 10"  (1.473 m), weight 76.1 kg, SpO2 98 %. CN II-XII grossly intact 5/5 throughout BLE Incision c/d/I with dermabond in place  Disposition: Discharge disposition: 01-Home or Self Care      Discharge Instructions     Diet - low sodium heart healthy   Complete by: As directed    Incentive spirometry RT   Complete by: As directed    No wound care   Complete by: As directed       Allergies as of 03/08/2021       Reactions   Codeine Itching        Medication List     STOP taking these medications    benzonatate 200 MG capsule Commonly known as: TESSALON   diclofenac 75 MG EC tablet Commonly known as: VOLTAREN       TAKE these medications    albuterol 108 (90 Base) MCG/ACT inhaler Commonly known as: VENTOLIN HFA Inhale 1 puff into the lungs every 6 (six) hours as needed for wheezing or shortness of breath.    blood glucose meter kit and supplies Kit Dispense based on patient and insurance preference. Patient requests one touch verio flex system and test strips. Check three times daily. Dx code E11.9.   celecoxib 50 MG capsule Commonly known as: CeleBREX Take 1 capsule (50 mg total) by mouth 2 (two) times daily.   Contour Blood Glucose System w/Device Kit Used to check blood glucose 3 times daily, diagnosis code E11.9   ezetimibe 10 MG tablet Commonly known as: ZETIA TAKE 1 TABLET DAILY   glucose blood test strip once daily.   glucose blood test strip Commonly known as: Contour Test Use as instructed   hydrochlorothiazide 25 MG tablet Commonly known as: HYDRODIURIL TAKE 1 TABLET DAILY   Jardiance 25 MG Tabs tablet Generic drug: empagliflozin TAKE 1 TABLET DAILY   levothyroxine 125 MCG tablet Commonly known as: SYNTHROID TAKE 1 TABLET DAILY What changed: when to take this   lisinopril 20 MG tablet Commonly known as: ZESTRIL TAKE 1 TABLET DAILY   metFORMIN 500 MG tablet Commonly known as: GLUCOPHAGE TAKE 1 TABLET TWICE A DAY WITH MEALS   methocarbamol 500 MG tablet Commonly known as: ROBAXIN Take 1 tablet (500 mg total) by mouth every 6 (six) hours.   multivitamin tablet Take 1 tablet by mouth daily.   oxyCODONE 5 MG immediate release tablet Commonly known as: Oxy IR/ROXICODONE Take 1 tablet (5 mg total) by mouth every 4 (four) hours as needed for up to 5 days for moderate pain ((score 4  to 6)).   rosuvastatin 40 MG tablet Commonly known as: CRESTOR TAKE 1 TABLET DAILY   senna 8.6 MG Tabs tablet Commonly known as: SENOKOT Take 1 tablet (8.6 mg total) by mouth 2 (two) times daily.   Trulicity 3 NL/2.7KN Sopn Generic drug: Dulaglutide Inject 3 mg as directed once a week.        Follow-up Information     Loleta Dicker, PA Follow up in 2 week(s).   Why: For incison check. This appointment should already be scheduled with the [facility]. Please  call the office with any questions or concerns regarding appointment date or time. Contact information: [place] [address] [phone]                 Signed: Loleta Dicker 03/08/2021, 8:30 AM

## 2021-03-08 NOTE — Evaluation (Signed)
Physical Therapy Evaluation Patient Details Name: Brenda Farmer MRN: [id number] DOB: [birth year] Today's Date: 03/08/2021  History of Present Illness  Pt is 55 y.o. female s/p L4-L5 Transforaminal Lumbar Fusion.  Pt has PMH of asthma, sleep apnea, COPD, current smoker, HTN, depression, and diabetes.    Clinical Impression  Pt received in Semi-Fowler's position and agreeable to therapy.  Pt reports she was educated by OT prior to PT coming to room and knows how to perform log-rolling and how to don the brace.  Pt able to perform bed mobility with good technique and donning of the brace with no complications.  Pt educated on the importance of ambulation during the healing process.  Pt also educated on PT following hospital stay and declined services, but notes she will reach out to Dr. Lacinda Axon if she needs OPPT referral.  Patient is at baseline, all education completed, and time is given to address all questions/concerns. No additional skilled PT services needed at this time, PT signing off. PT recommends daily ambulation ad lib or with nursing staff as needed to prevent deconditioning.       Recommendations for follow up therapy are one component of a multi-disciplinary discharge planning process, led by the attending physician.  Recommendations may be updated based on patient status, additional functional criteria and insurance authorization.  Follow Up Recommendations No PT follow up    Equipment Recommendations  Rolling walker with 5" wheels    Recommendations for Other Services       Precautions / Restrictions Precautions Precautions: Back Required Braces or Orthoses: Spinal Brace Spinal Brace: Applied in sitting position Restrictions Weight Bearing Restrictions: No      Mobility  Bed Mobility Overal bed mobility: Modified Independent Bed Mobility: Supine to Sit;Sit to Supine           General bed mobility comments: Pt with good technique, VC's only for reminding of  precautions.    Transfers Overall transfer level: Needs assistance Equipment used: Rolling walker (2 wheeled) Transfers: Sit to/from Stand Sit to Stand: Supervision            Ambulation/Gait Ambulation/Gait assistance: Supervision   Assistive device: Standard walker Gait Pattern/deviations: WFL(Within Functional Limits) Gait velocity: slightly decreased.   General Gait Details: Pt with good technique during ambulation and uses the FWW effectively for stability.  Stairs            Wheelchair Mobility    Modified Rankin (Stroke Patients Only)       Balance Overall balance assessment: Needs assistance Sitting-balance support: Feet supported;No upper extremity supported Sitting balance-Leahy Scale: Normal     Standing balance support: No upper extremity supported;During functional activity Standing balance-Leahy Scale: Good                               Pertinent Vitals/Pain Pain Assessment: Faces Faces Pain Scale: Hurts a little bit Pain Location: back Pain Descriptors / Indicators: Discomfort Pain Intervention(s): Limited activity within patient's tolerance    Home Living Family/patient expects to be discharged to:: Private residence Living Arrangements: Spouse/significant other Available Help at Discharge: Family Type of Home: House Home Access: Level entry     Home Layout: One level (has one step inside with grab bars installed pre surgery) Home Equipment: None      Prior Function Level of Independence: Independent         Comments: Independent in ADLs, husband assisted with IADLs  Hand Dominance        Extremity/Trunk Assessment   Upper Extremity Assessment Upper Extremity Assessment: Overall WFL for tasks assessed    Lower Extremity Assessment Lower Extremity Assessment: Overall WFL for tasks assessed       Communication   Communication: No difficulties  Cognition Arousal/Alertness: Awake/alert Behavior  During Therapy: WFL for tasks assessed/performed Overall Cognitive Status: Within Functional Limits for tasks assessed                                        General Comments      Exercises Other Exercises Other Exercises: Pt educated on roles of PT and services provided during hospital stay.  Pt also educated and given spinal precautions packet for home use.   Assessment/Plan    PT Assessment Patent does not need any further PT services  PT Problem List         PT Treatment Interventions Gait training;Therapeutic activities;Therapeutic exercise    PT Goals (Current goals can be found in the Care Plan section)  Acute Rehab PT Goals Patient Stated Goal: to get better PT Goal Formulation: With patient Time For Goal Achievement: 03/22/21 Potential to Achieve Goals: Good    Frequency     Barriers to discharge        Co-evaluation               AM-PAC PT "6 Clicks" Mobility  Outcome Measure Help needed turning from your back to your side while in a flat bed without using bedrails?: None Help needed moving from lying on your back to sitting on the side of a flat bed without using bedrails?: None Help needed moving to and from a bed to a chair (including a wheelchair)?: None Help needed standing up from a chair using your arms (e.g., wheelchair or bedside chair)?: None Help needed to walk in hospital room?: None Help needed climbing 3-5 steps with a railing? : A Little 6 Click Score: 23    End of Session Equipment Utilized During Treatment: Gait belt Activity Tolerance: Patient tolerated treatment well Patient left: in bed Nurse Communication: Mobility status PT Visit Diagnosis: Unsteadiness on feet (R26.81)    Time: 1000-1025 PT Time Calculation (min) (ACUTE ONLY): 25 min   Charges:   PT Evaluation $PT Eval Low Complexity: 1 Low PT Treatments $Gait Training: 8-22 mins $Therapeutic Activity: 8-22 mins        Gwenlyn Saran, PT,  DPT 03/08/21, 11:32 AM   Brenda Farmer 03/08/2021, 11:30 AM

## 2021-03-11 ENCOUNTER — Telehealth: Payer: Self-pay

## 2021-03-11 NOTE — Telephone Encounter (Signed)
Transition Care Management Follow-up Telephone Call Date of discharge and from where: 03/08/21 [facility] How have you been since you were released from the hospital? "I am very well, my legs are not going numb anymore" Any questions or concerns? No  Items Reviewed: Did the pt receive and understand the discharge instructions provided? Yes  Medications obtained and verified?  Awaiting Celecoxib from pharmacy . Patient states she will call pharmacy today to see if ready. RNCM encouraged patient to follow up with surgeon if unable to obtain. Other? No  Any new allergies since your discharge? No  Dietary orders reviewed? Yes Do you have support at home? Yes   Home Care and Equipment/Supplies: Were home health services ordered? not applicable If so, what is the name of the agency? No  Has the agency set up a time to come to the patient's home? not applicable Were any new equipment or medical supplies ordered?  No What is the name of the medical supply agency? no Were you able to get the supplies/equipment? not applicable Do you have any questions related to the use of the equipment or supplies? No  Functional Questionnaire: (I = Independent and D = Dependent) ADLs: I limited assistance  Bathing/Dressing- I   Meal Prep- I  Eating- I  Maintaining continence- I  Transferring/Ambulation- I with walker  Managing Meds- I  Follow up appointments reviewed:  PCP Hospital f/u appt confirmed? Yes  Scheduled to see Dr. Caryl Bis on 04/01/21 @ 3:30am. [facility] f/u appt confirmed? Yes  Scheduled to see Dr. Lacinda Axon on 03/15/21 at 10 am and 03/22/21 @ 10:30 am. Are transportation arrangements needed? No  If their condition worsens, is the pt aware to call PCP or go to the Emergency Dept.? Yes Was the patient provided with contact information for the PCP's office or ED? Yes Was to pt encouraged to call back with questions or concerns? Yes    Thea Silversmith, RN, MSN, BSN, [facility]  Management Coordinator [phone]

## 2021-03-27 ENCOUNTER — Telehealth: Payer: Self-pay | Admitting: Family Medicine

## 2021-03-27 NOTE — Telephone Encounter (Signed)
I received a warning that the patient may be taking Celebrex as well as diclofenac.  It looks like she only has Celebrex on her medication list.  Can you contact the patient and make sure she is only taking the Celebrex?  Thanks.

## 2021-03-28 NOTE — Telephone Encounter (Signed)
I called and spoke with the patient and she stated she only is taking the Celebrex, she stated they gave her the Celebrex after her back surgery and the providers stopped the diclofenac and she is not taking both only the Celebrex.  Brenda Farmer,cma

## 2021-03-28 NOTE — Telephone Encounter (Signed)
Noted  

## 2021-03-31 LAB — HM DIABETES EYE EXAM

## 2021-04-01 ENCOUNTER — Ambulatory Visit (INDEPENDENT_AMBULATORY_CARE_PROVIDER_SITE_OTHER): Payer: BC Managed Care – PPO | Admitting: Family Medicine

## 2021-04-01 ENCOUNTER — Other Ambulatory Visit: Payer: Self-pay

## 2021-04-01 ENCOUNTER — Telehealth: Payer: Self-pay

## 2021-04-01 ENCOUNTER — Encounter: Payer: Self-pay | Admitting: Family Medicine

## 2021-04-01 DIAGNOSIS — E039 Hypothyroidism, unspecified: Secondary | ICD-10-CM

## 2021-04-01 DIAGNOSIS — Z981 Arthrodesis status: Secondary | ICD-10-CM | POA: Insufficient documentation

## 2021-04-01 DIAGNOSIS — G4733 Obstructive sleep apnea (adult) (pediatric): Secondary | ICD-10-CM

## 2021-04-01 DIAGNOSIS — E785 Hyperlipidemia, unspecified: Secondary | ICD-10-CM

## 2021-04-01 DIAGNOSIS — I10 Essential (primary) hypertension: Secondary | ICD-10-CM

## 2021-04-01 DIAGNOSIS — E119 Type 2 diabetes mellitus without complications: Secondary | ICD-10-CM | POA: Diagnosis not present

## 2021-04-01 HISTORY — DX: Arthrodesis status: Z98.1

## 2021-04-01 NOTE — Assessment & Plan Note (Signed)
Check TSH.  She will continue Synthroid 125 mcg once daily.

## 2021-04-01 NOTE — Progress Notes (Signed)
Tommi Rumps, MD Phone: [phone]  Brenda Farmer is a 55 y.o. female who presents today for f/u.  DIABETES Disease Monitoring: Blood Sugar ranges-not checking Polyuria/phagia/dipsia- no      Optho- UTD Medications: Compliance- taking jardiance, metformin, trulicity Hypoglycemic symptoms- no  HYPERTENSION Disease Monitoring Home BP Monitoring not checking Chest pain- no    Dyspnea- no Medications Compliance-  taking lisinopril, HCTZ.   Edema- no BMET    Component Value Date/Time   NA 140 01/11/2021 1048   NA 137 06/26/2014 0000   K 3.8 01/11/2021 1048   CL 100 01/11/2021 1048   CO2 27 01/11/2021 1048   GLUCOSE 145 (H) 01/11/2021 1048   BUN 17 01/11/2021 1048   BUN 14 06/26/2014 0000   CREATININE 0.67 01/11/2021 1048   CREATININE 0.79 11/12/2020 1605   CALCIUM 9.9 01/11/2021 1048   GFRNONAA >60 01/11/2021 1048   GFRAA 141 07/15/2007 1610   HYPOTHYROIDISM Disease Monitoring Weight changes: no  Skin Changes: no Heat/Cold intolerance: no  Medication Monitoring Compliance:  taking synthroid   Last TSH:   Lab Results  Component Value Date   TSH 0.76 10/10/2019   OSA CPAP use: nightly the entire night Hypersomnia: no Well rested: yes CPAP company: received from [email or web address] reports her machine advises her she has an AHI of less than 1. Needs CPAP supplies.   The patient is status post lumbar fusion.  She notes the numbness in her leg has resolved.  She is no longer urinating on herself.  She continues to follow with neurosurgery.   Social History   Tobacco Use  Smoking Status Every Day   Packs/day: 0.50   Years: 42.00   Pack years: 21.00   Types: Cigarettes  Smokeless Tobacco Never  Tobacco Comments   working on quitting    Current Outpatient Medications on File Prior to Visit  Medication Sig Dispense Refill   albuterol (VENTOLIN HFA) 108 (90 Base) MCG/ACT inhaler Inhale 1 puff into the lungs every 6 (six) hours as needed for wheezing or  shortness of breath. 8 g 0   celecoxib (CELEBREX) 50 MG capsule Take 1 capsule (50 mg total) by mouth 2 (two) times daily. 60 capsule 0   Dulaglutide (TRULICITY) 3 XK/5.5VZ SOPN Inject 3 mg as directed once a week. 6 mL 1   ezetimibe (ZETIA) 10 MG tablet TAKE 1 TABLET DAILY (Patient taking differently: Take 10 mg by mouth daily.) 90 tablet 3   glucose blood (CONTOUR TEST) test strip Use as instructed 100 each 12   hydrochlorothiazide (HYDRODIURIL) 25 MG tablet TAKE 1 TABLET DAILY (Patient taking differently: Take 25 mg by mouth daily.) 90 tablet 3   JARDIANCE 25 MG TABS tablet TAKE 1 TABLET DAILY 90 tablet 3   lisinopril (ZESTRIL) 20 MG tablet TAKE 1 TABLET DAILY (Patient taking differently: Take 20 mg by mouth daily.) 90 tablet 3   metFORMIN (GLUCOPHAGE) 500 MG tablet TAKE 1 TABLET TWICE A DAY WITH MEALS (Patient taking differently: Take 500 mg by mouth 2 (two) times daily with a meal.) 180 tablet 3   Multiple Vitamin (MULTIVITAMIN) tablet Take 1 tablet by mouth daily.     rosuvastatin (CRESTOR) 40 MG tablet TAKE 1 TABLET DAILY (Patient taking differently: Take 40 mg by mouth daily.) 90 tablet 3   senna (SENOKOT) 8.6 MG TABS tablet Take 1 tablet (8.6 mg total) by mouth 2 (two) times daily. 120 tablet 0   glucose blood test strip once daily.     levothyroxine (SYNTHROID)  125 MCG tablet TAKE 1 TABLET DAILY (Patient not taking: Reported on 04/01/2021) 90 tablet 3   methocarbamol (ROBAXIN) 500 MG tablet Take 1 tablet (500 mg total) by mouth every 6 (six) hours. 60 tablet 0   No current facility-administered medications on file prior to visit.     ROS see history of present illness  Objective  Physical Exam Vitals:   04/01/21 1533  BP: 110/60  Pulse: (!) 102  Temp: 99.1 F (37.3 C)  SpO2: 96%    BP Readings from Last 3 Encounters:  04/01/21 110/60  03/08/21 122/71  01/11/21 109/65   Wt Readings from Last 3 Encounters:  04/01/21 160 lb 9.6 oz (72.8 kg)  03/07/21 167 lb 12.3 oz  (76.1 kg)  01/11/21 166 lb 3.2 oz (75.4 kg)    Physical Exam Constitutional:      General: She is not in acute distress.    Appearance: She is not diaphoretic.  Cardiovascular:     Rate and Rhythm: Normal rate and regular rhythm.     Heart sounds: Normal heart sounds.  Pulmonary:     Effort: Pulmonary effort is normal.     Breath sounds: Normal breath sounds.  Skin:    General: Skin is warm and dry.  Neurological:     Mental Status: She is alert.     Assessment/Plan: Please see individual problem list.  Problem List Items Addressed This Visit     Benign essential HTN    Well-controlled.  She will continue hydrochlorothiazide 25 mg once daily and lisinopril 20 mg daily.      Relevant Orders   Comp Met (CMET)   Controlled type 2 diabetes mellitus without complication ([place])    Check a1c.  She will continue Jardiance 25 mg, metformin 500 mg twice daily, and Trulicity 3 mg weekly.      Relevant Orders   HgB A1c   Hyperlipidemia    Check lipid panel.  She will continue Crestor 40 mg once daily.      Relevant Orders   Lipid panel   Hypothyroidism    Check TSH.  She will continue Synthroid 125 mcg once daily.      Relevant Orders   TSH   OSA (obstructive sleep apnea)    Written prescription for CPAP supplies given.  She will take this to [facility].      Status post lumbar spinal fusion    She will continue to see her surgeon.        Return in about 3 months (around 07/02/2021).  This visit occurred during the SARS-CoV-2 public health emergency.  Safety protocols were in place, including screening questions prior to the visit, additional usage of staff PPE, and extensive cleaning of exam room while observing appropriate contact time as indicated for disinfecting solutions.    Tommi Rumps, MD [place]

## 2021-04-01 NOTE — Assessment & Plan Note (Signed)
Check lipid panel.  She will continue Crestor 40 mg once daily.

## 2021-04-01 NOTE — Assessment & Plan Note (Signed)
Well-controlled.  She will continue hydrochlorothiazide 25 mg once daily and lisinopril 20 mg daily.

## 2021-04-01 NOTE — Patient Instructions (Signed)
Nice to see you. We will get lab work today and contact you with the results. 

## 2021-04-01 NOTE — Assessment & Plan Note (Signed)
Written prescription for CPAP supplies given.  She will take this to [facility].

## 2021-04-01 NOTE — Assessment & Plan Note (Signed)
Check a1c.  She will continue Jardiance 25 mg, metformin 500 mg twice daily, and Trulicity 3 mg weekly.

## 2021-04-01 NOTE — Assessment & Plan Note (Signed)
She will continue to see her surgeon.

## 2021-04-02 LAB — COMPREHENSIVE METABOLIC PANEL
AG Ratio: 2.4 (calc) (ref 1.0–2.5)
ALT: 40 U/L — ABNORMAL HIGH (ref 6–29)
AST: 25 U/L (ref 10–35)
Albumin: 4.7 g/dL (ref 3.6–5.1)
Alkaline phosphatase (APISO): 81 U/L (ref 37–153)
BUN: 22 mg/dL (ref 7–25)
CO2: 25 mmol/L (ref 20–32)
Calcium: 10.4 mg/dL (ref 8.6–10.4)
Chloride: 99 mmol/L (ref 98–110)
Creat: 0.65 mg/dL (ref 0.50–1.03)
Globulin: 2 g/dL (calc) (ref 1.9–3.7)
Glucose, Bld: 112 mg/dL — ABNORMAL HIGH (ref 65–99)
Potassium: 4.1 mmol/L (ref 3.5–5.3)
Sodium: 139 mmol/L (ref 135–146)
Total Bilirubin: 0.3 mg/dL (ref 0.2–1.2)
Total Protein: 6.7 g/dL (ref 6.1–8.1)

## 2021-04-02 LAB — HEMOGLOBIN A1C
Hgb A1c MFr Bld: 7.5 % of total Hgb — ABNORMAL HIGH (ref <5.7)
Mean Plasma Glucose: 169 mg/dL
eAG (mmol/L): 9.3 mmol/L

## 2021-04-02 LAB — LIPID PANEL
Cholesterol: 108 mg/dL (ref <200)
HDL: 43 mg/dL — ABNORMAL LOW (ref >=50)
LDL Cholesterol (Calc): 37 mg/dL (calc)
Non-HDL Cholesterol (Calc): 65 mg/dL (calc) (ref <130)
Total CHOL/HDL Ratio: 2.5 (calc) (ref <5.0)
Triglycerides: 224 mg/dL — ABNORMAL HIGH (ref <150)

## 2021-04-02 LAB — TSH: TSH: 2.3 mIU/L (ref 0.40–4.50)

## 2021-04-06 MED ORDER — TRULICITY 4.5 MG/0.5ML ~~LOC~~ SOAJ
4.5000 mg | SUBCUTANEOUS | 2 refills | Status: DC
Start: 2021-04-06 — End: 2021-04-27

## 2021-04-06 NOTE — Addendum Note (Signed)
Addended by: Fulton Mole D on: 04/06/2021 01:40 PM   Modules accepted: Orders

## 2021-04-11 ENCOUNTER — Other Ambulatory Visit: Payer: Self-pay | Admitting: Family Medicine

## 2021-04-11 DIAGNOSIS — E119 Type 2 diabetes mellitus without complications: Secondary | ICD-10-CM

## 2021-04-25 ENCOUNTER — Encounter: Payer: Self-pay | Admitting: Family Medicine

## 2021-04-25 DIAGNOSIS — E119 Type 2 diabetes mellitus without complications: Secondary | ICD-10-CM

## 2021-04-27 MED ORDER — TRULICITY 4.5 MG/0.5ML ~~LOC~~ SOAJ: 4.5000 mg | SUBCUTANEOUS | 2 refills | Status: DC

## 2021-04-28 DIAGNOSIS — M79674 Pain in right toe(s): Secondary | ICD-10-CM | POA: Insufficient documentation

## 2021-04-28 DIAGNOSIS — S90121A Contusion of right lesser toe(s) without damage to nail, initial encounter: Secondary | ICD-10-CM | POA: Insufficient documentation

## 2021-04-28 HISTORY — DX: Pain in right toe(s): M79.674

## 2021-05-02 ENCOUNTER — Encounter: Payer: Self-pay | Admitting: Family Medicine

## 2021-05-25 NOTE — Telephone Encounter (Signed)
Pt called in stating she is completely out of medication and she has not had it in a week. Pharmacy said it would be in stock but they said it could be February before it comes in.

## 2021-07-15 ENCOUNTER — Ambulatory Visit: Payer: BC Managed Care – PPO | Admitting: Family Medicine

## 2021-07-15 ENCOUNTER — Other Ambulatory Visit: Payer: Self-pay

## 2021-07-15 ENCOUNTER — Encounter: Payer: Self-pay | Admitting: Family Medicine

## 2021-07-15 VITALS — BP 100/60 | HR 83 | Temp 98.7°F | Ht <= 58 in | Wt 161.4 lb

## 2021-07-15 DIAGNOSIS — E039 Hypothyroidism, unspecified: Secondary | ICD-10-CM | POA: Diagnosis not present

## 2021-07-15 DIAGNOSIS — E669 Obesity, unspecified: Secondary | ICD-10-CM

## 2021-07-15 DIAGNOSIS — E119 Type 2 diabetes mellitus without complications: Secondary | ICD-10-CM

## 2021-07-15 LAB — POCT GLYCOSYLATED HEMOGLOBIN (HGB A1C): Hemoglobin A1C: 8.5 % — AB (ref 4.0–5.6)

## 2021-07-15 MED ORDER — TIRZEPATIDE 5 MG/0.5ML ~~LOC~~ SOAJ: 5.0000 mg | SUBCUTANEOUS | 0 refills | Status: DC

## 2021-07-15 NOTE — Patient Instructions (Addendum)
Nice to see you. Please quit smoking. Please try to add in some exercise. We are going to switch you over to [facility].  If it is excessively expensive please let us know.  We will start this in place of your next dose of Trulicity. If you notice excessive nausea with this please let me know.

## 2021-07-15 NOTE — Addendum Note (Signed)
Addended by: Leone Haven on: 07/15/2021 05:12 PM   Modules accepted: Orders

## 2021-07-15 NOTE — Assessment & Plan Note (Signed)
Recent TSH acceptable.  She will continue Synthroid 125 mcg once daily.

## 2021-07-15 NOTE — Assessment & Plan Note (Signed)
Encouraged adding in some exercise.  Discussed healthy diet.

## 2021-07-15 NOTE — Progress Notes (Signed)
Tommi Rumps, MD Phone: [phone]  Brenda Farmer is a 56 y.o. female who presents today for f/u.  DIABETES Disease Monitoring: Blood Sugar ranges-not always checking fasting Polyuria/phagia/dipsia- no      Optho- UTD Medications: Compliance- taking trulicity, jardiance, metformin Hypoglycemic symptoms- no  HYPOTHYROIDISM Disease Monitoring Weight changes: no  Skin Changes: no Heat/Cold intolerance: no  Medication Monitoring Compliance:  taking synthroid   Last TSH:   Lab Results  Component Value Date   TSH 2.30 04/01/2021   Obesity: The patient does not exercise though she is active.  Tries to eat generally healthy with not much fried foods.  Gets plenty of fruits and vegetables.  No sugary drinks.  Not many sugary foods.   Social History   Tobacco Use  Smoking Status Every Day   Packs/day: 0.50   Years: 42.00   Pack years: 21.00   Types: Cigarettes  Smokeless Tobacco Never  Tobacco Comments   working on quitting    Current Outpatient Medications on File Prior to Visit  Medication Sig Dispense Refill   albuterol (VENTOLIN HFA) 108 (90 Base) MCG/ACT inhaler Inhale 1 puff into the lungs every 6 (six) hours as needed for wheezing or shortness of breath. 8 g 0   ezetimibe (ZETIA) 10 MG tablet TAKE 1 TABLET DAILY (Patient taking differently: Take 10 mg by mouth daily.) 90 tablet 3   glucose blood (CONTOUR TEST) test strip Use as instructed 100 each 12   hydrochlorothiazide (HYDRODIURIL) 25 MG tablet TAKE 1 TABLET DAILY (Patient taking differently: Take 25 mg by mouth daily.) 90 tablet 3   JARDIANCE 25 MG TABS tablet TAKE 1 TABLET DAILY 90 tablet 3   levothyroxine (SYNTHROID) 125 MCG tablet TAKE 1 TABLET DAILY 90 tablet 3   lisinopril (ZESTRIL) 20 MG tablet TAKE 1 TABLET DAILY (Patient taking differently: Take 20 mg by mouth daily.) 90 tablet 3   metFORMIN (GLUCOPHAGE) 500 MG tablet TAKE 1 TABLET TWICE A DAY WITH MEALS (Patient taking differently: Take 500 mg by  mouth 2 (two) times daily with a meal.) 180 tablet 3   Multiple Vitamin (MULTIVITAMIN) tablet Take 1 tablet by mouth daily.     rosuvastatin (CRESTOR) 40 MG tablet TAKE 1 TABLET DAILY (Patient taking differently: Take 40 mg by mouth daily.) 90 tablet 3   senna (SENOKOT) 8.6 MG TABS tablet Take 1 tablet (8.6 mg total) by mouth 2 (two) times daily. 120 tablet 0   diclofenac (VOLTAREN) 75 MG EC tablet Take 75 mg by mouth 2 (two) times daily.     glucose blood test strip once daily.     methocarbamol (ROBAXIN) 500 MG tablet Take 1 tablet (500 mg total) by mouth every 6 (six) hours. 60 tablet 0   No current facility-administered medications on file prior to visit.     ROS see history of present illness  Objective  Physical Exam Vitals:   07/15/21 1510  BP: 100/60  Pulse: 83  Temp: 98.7 F (37.1 C)  SpO2: 96%    BP Readings from Last 3 Encounters:  07/15/21 100/60  04/01/21 110/60  03/08/21 122/71   Wt Readings from Last 3 Encounters:  07/15/21 161 lb 6.4 oz (73.2 kg)  04/01/21 160 lb 9.6 oz (72.8 kg)  03/07/21 167 lb 12.3 oz (76.1 kg)    Physical Exam Constitutional:      General: She is not in acute distress.    Appearance: She is not diaphoretic.  Cardiovascular:     Rate and Rhythm:  Normal rate and regular rhythm.     Heart sounds: Normal heart sounds.  Pulmonary:     Effort: Pulmonary effort is normal.     Breath sounds: Normal breath sounds.  Skin:    General: Skin is warm and dry.  Neurological:     Mental Status: She is alert.     Assessment/Plan: Please see individual problem list.  Problem List Items Addressed This Visit     Controlled type 2 diabetes mellitus without complication ([place]) - Primary    A1c is uncontrolled.  Continue Jardiance 25 mg daily, and metformin 500 mg twice daily.  We will try to switch her to [facility].  Discussed risk of nausea with this.  Given that the patient is on max dosing Trulicity we will switch to the 5 mg dose and if she  has excessive nausea with this we can reduce the dose to the 2.5 mg dose.  If she develops abdominal pain she will discontinue the medication and let us know right away.  Discussed the risk of pancreatitis and gallbladder issues.  Discussed the risk of medullary thyroid cancer as seen in rats studies.  Discussed this seems to be fairly low risk in humans with this medication though if she ever noticed anything in her thyroid area she would need to let us know.      Relevant Medications   tirzepatide (MOUNJARO) 5 MG/0.5ML Pen   Other Relevant Orders   POCT HgB A1C (Completed)   Hypothyroidism    Recent TSH acceptable.  She will continue Synthroid 125 mcg once daily.      Obesity (BMI 30.0-34.9)    Encouraged adding in some exercise.  Discussed healthy diet.        Return in about 3 months (around 10/12/2021).  This visit occurred during the SARS-CoV-2 public health emergency.  Safety protocols were in place, including screening questions prior to the visit, additional usage of staff PPE, and extensive cleaning of exam room while observing appropriate contact time as indicated for disinfecting solutions.    Tommi Rumps, MD [place]

## 2021-07-15 NOTE — Assessment & Plan Note (Addendum)
A1c is uncontrolled.  Continue Jardiance 25 mg daily, and metformin 500 mg twice daily.  We will try to switch her to [facility].  Discussed risk of nausea with this.  Given that the patient is on max dosing Trulicity we will switch to the 5 mg dose and if she has excessive nausea with this we can reduce the dose to the 2.5 mg dose.  If she develops abdominal pain she will discontinue the medication and let us know right away.  Discussed the risk of pancreatitis and gallbladder issues.  Discussed the risk of medullary thyroid cancer as seen in rats studies.  Discussed this seems to be fairly low risk in humans with this medication though if she ever noticed anything in her thyroid area she would need to let us know.

## 2021-08-04 ENCOUNTER — Other Ambulatory Visit: Payer: Self-pay | Admitting: Family Medicine

## 2021-08-22 ENCOUNTER — Other Ambulatory Visit: Payer: Self-pay | Admitting: Family Medicine

## 2021-08-22 DIAGNOSIS — E119 Type 2 diabetes mellitus without complications: Secondary | ICD-10-CM

## 2021-08-22 DIAGNOSIS — E782 Mixed hyperlipidemia: Secondary | ICD-10-CM

## 2021-09-02 ENCOUNTER — Other Ambulatory Visit: Payer: Self-pay | Admitting: *Deleted

## 2021-09-02 DIAGNOSIS — Z87891 Personal history of nicotine dependence: Secondary | ICD-10-CM

## 2021-09-02 DIAGNOSIS — Z122 Encounter for screening for malignant neoplasm of respiratory organs: Secondary | ICD-10-CM

## 2021-09-02 DIAGNOSIS — F17210 Nicotine dependence, cigarettes, uncomplicated: Secondary | ICD-10-CM

## 2021-09-19 ENCOUNTER — Other Ambulatory Visit: Payer: Self-pay | Admitting: Family Medicine

## 2021-09-19 DIAGNOSIS — I10 Essential (primary) hypertension: Secondary | ICD-10-CM

## 2021-09-30 ENCOUNTER — Other Ambulatory Visit: Payer: Self-pay

## 2021-09-30 ENCOUNTER — Ambulatory Visit
Admission: RE | Admit: 2021-09-30 | Discharge: 2021-09-30 | Disposition: A | Discharge status: Home / Self Care | Payer: BC Managed Care – PPO | Source: Ambulatory Visit | Attending: Acute Care | Admitting: Acute Care

## 2021-09-30 DIAGNOSIS — F17210 Nicotine dependence, cigarettes, uncomplicated: Secondary | ICD-10-CM | POA: Diagnosis not present

## 2021-09-30 DIAGNOSIS — J439 Emphysema, unspecified: Secondary | ICD-10-CM | POA: Insufficient documentation

## 2021-09-30 DIAGNOSIS — I700 Atherosclerosis of aorta: Secondary | ICD-10-CM | POA: Insufficient documentation

## 2021-09-30 DIAGNOSIS — Z122 Encounter for screening for malignant neoplasm of respiratory organs: Secondary | ICD-10-CM | POA: Insufficient documentation

## 2021-09-30 DIAGNOSIS — Z87891 Personal history of nicotine dependence: Secondary | ICD-10-CM

## 2021-10-02 ENCOUNTER — Other Ambulatory Visit: Payer: Self-pay | Admitting: Family Medicine

## 2021-10-02 DIAGNOSIS — E119 Type 2 diabetes mellitus without complications: Secondary | ICD-10-CM

## 2021-10-04 ENCOUNTER — Other Ambulatory Visit: Payer: Self-pay

## 2021-10-04 DIAGNOSIS — Z122 Encounter for screening for malignant neoplasm of respiratory organs: Secondary | ICD-10-CM

## 2021-10-04 DIAGNOSIS — Z87891 Personal history of nicotine dependence: Secondary | ICD-10-CM

## 2021-10-04 DIAGNOSIS — F17210 Nicotine dependence, cigarettes, uncomplicated: Secondary | ICD-10-CM

## 2021-10-14 ENCOUNTER — Ambulatory Visit: Payer: BC Managed Care – PPO | Admitting: Family Medicine

## 2021-10-20 ENCOUNTER — Other Ambulatory Visit: Payer: Self-pay | Admitting: Family Medicine

## 2021-10-20 DIAGNOSIS — E119 Type 2 diabetes mellitus without complications: Secondary | ICD-10-CM

## 2021-10-27 ENCOUNTER — Other Ambulatory Visit: Payer: Self-pay | Admitting: Family Medicine

## 2021-10-27 DIAGNOSIS — E039 Hypothyroidism, unspecified: Secondary | ICD-10-CM

## 2021-10-28 ENCOUNTER — Encounter: Payer: Self-pay | Admitting: Family Medicine

## 2021-10-28 ENCOUNTER — Ambulatory Visit: Payer: BC Managed Care – PPO | Admitting: Family Medicine

## 2021-10-28 VITALS — BP 90/60 | HR 86 | Temp 98.8°F | Ht <= 58 in | Wt 154.0 lb

## 2021-10-28 DIAGNOSIS — E1165 Type 2 diabetes mellitus with hyperglycemia: Secondary | ICD-10-CM

## 2021-10-28 DIAGNOSIS — B379 Candidiasis, unspecified: Secondary | ICD-10-CM | POA: Insufficient documentation

## 2021-10-28 DIAGNOSIS — I10 Essential (primary) hypertension: Secondary | ICD-10-CM | POA: Diagnosis not present

## 2021-10-28 DIAGNOSIS — E119 Type 2 diabetes mellitus without complications: Secondary | ICD-10-CM

## 2021-10-28 MED ORDER — FLUCONAZOLE 150 MG PO TABS: 150.0000 mg | ORAL_TABLET | ORAL | 0 refills | Status: AC

## 2021-10-28 NOTE — Assessment & Plan Note (Signed)
Seems to have improved.  We will check an A1c.  She will continue Mounjaro 5 mg weekly and metformin 500 mg twice daily.  Discussed the potential for increasing Mounjaro once her A1c returns.

## 2021-10-28 NOTE — Progress Notes (Signed)
Tommi Rumps, MD Phone: [phone]  Brenda Farmer is a 56 y.o. female who presents today for follow-up.  DIABETES Disease Monitoring: Blood Sugar ranges-110-194 fasting Polyuria/phagia/dipsia- no      Optho- UTD Medications: Compliance- taking mounjaro, metformin Hypoglycemic symptoms- 1x when she did not eat at the right time. Patient reports she is cut back on red meat and is eating more fresh vegetables.  She is eating less fried foods.  HYPERTENSION Disease Monitoring Home BP Monitoring not checking chest pain- no    Dyspnea- no Medications Compliance-  taking lisinopril, HCTZ. Lightheadedness-no edema- no BMET    Component Value Date/Time   NA 139 04/01/2021 1559   NA 137 06/26/2014 0000   K 4.1 04/01/2021 1559   CL 99 04/01/2021 1559   CO2 25 04/01/2021 1559   GLUCOSE 112 (H) 04/01/2021 1559   BUN 22 04/01/2021 1559   BUN 14 06/26/2014 0000   CREATININE 0.65 04/01/2021 1559   CALCIUM 10.4 04/01/2021 1559   GFRNONAA >60 01/11/2021 1048   GFRAA 141 07/15/2007 1610   Yeast infection: Patient reports recent onset itching and rawness in her vagina.  She notes no vaginal discharge.  No bleeding.  She notes she has not had a yeast infection in a while.   Social History   Tobacco Use  Smoking Status Every Day   Packs/day: 0.50   Years: 42.00   Total pack years: 21.00   Types: Cigarettes  Smokeless Tobacco Never  Tobacco Comments   working on quitting    Current Outpatient Medications on File Prior to Visit  Medication Sig Dispense Refill   albuterol (VENTOLIN HFA) 108 (90 Base) MCG/ACT inhaler Inhale 1 puff into the lungs every 6 (six) hours as needed for wheezing or shortness of breath. 8 g 0   diclofenac (VOLTAREN) 75 MG EC tablet Take 75 mg by mouth 2 (two) times daily.     escitalopram (LEXAPRO) 10 MG tablet TAKE 1 TABLET DAILY 90 tablet 3   ezetimibe (ZETIA) 10 MG tablet TAKE 1 TABLET DAILY 90 tablet 3   glucose blood (CONTOUR TEST) test strip  Use as instructed 100 each 12   hydrochlorothiazide (HYDRODIURIL) 25 MG tablet TAKE 1 TABLET DAILY 90 tablet 3   levothyroxine (SYNTHROID) 125 MCG tablet TAKE 1 TABLET DAILY 90 tablet 3   lisinopril (ZESTRIL) 20 MG tablet TAKE 1 TABLET DAILY (Patient taking differently: Take 20 mg by mouth daily.) 90 tablet 3   metFORMIN (GLUCOPHAGE) 500 MG tablet TAKE 1 TABLET TWICE A DAY WITH MEALS 180 tablet 3   MOUNJARO 5 MG/0.5ML Pen INJECT 1 DOSE SUBCUTANEOUSLY ONCE A WEEK 12 mL 0   Multiple Vitamin (MULTIVITAMIN) tablet Take 1 tablet by mouth daily.     rosuvastatin (CRESTOR) 40 MG tablet TAKE 1 TABLET DAILY (Patient taking differently: Take 40 mg by mouth daily.) 90 tablet 3   No current facility-administered medications on file prior to visit.     ROS see history of present illness  Objective  Physical Exam Vitals:   10/28/21 1615  BP: 90/60  Pulse: 86  Temp: 98.8 F (37.1 C)  SpO2: 93%    BP Readings from Last 3 Encounters:  10/28/21 90/60  07/15/21 100/60  04/01/21 110/60   Wt Readings from Last 3 Encounters:  10/28/21 154 lb (69.9 kg)  09/30/21 152 lb (68.9 kg)  07/15/21 161 lb 6.4 oz (73.2 kg)    Physical Exam Constitutional:      General: She is not in acute  distress.    Appearance: She is not diaphoretic.  Cardiovascular:     Rate and Rhythm: Normal rate and regular rhythm.     Heart sounds: Normal heart sounds.  Pulmonary:     Effort: Pulmonary effort is normal.     Breath sounds: Normal breath sounds.  Skin:    General: Skin is warm and dry.  Neurological:     Mental Status: She is alert.      Assessment/Plan: Please see individual problem list.  Problem List Items Addressed This Visit     Benign essential HTN - Primary (Chronic)    Well-controlled.  She will continue lisinopril 20 mg daily and HCTZ 25 mg daily.  Check labs.      Relevant Orders   Comp Met (CMET)   Uncontrolled type 2 diabetes mellitus with hyperglycemia ([place]) (Chronic)    Seems to  have improved.  We will check an A1c.  She will continue Mounjaro 5 mg weekly and metformin 500 mg twice daily.  Discussed the potential for increasing Mounjaro once her A1c returns.      Yeast infection    Symptoms are likely related to vaginal yeast infection.  We will treat empirically with Diflucan 150 mg every 3 days for 2 doses.  If this does not improve with this treatment she will let us know.      Relevant Medications   fluconazole (DIFLUCAN) 150 MG tablet    Return in about 3 months (around 01/28/2022) for Dm.   Tommi Rumps, MD [place]

## 2021-10-28 NOTE — Assessment & Plan Note (Signed)
Symptoms are likely related to vaginal yeast infection.  We will treat empirically with Diflucan 150 mg every 3 days for 2 doses.  If this does not improve with this treatment she will let us know.

## 2021-10-28 NOTE — Assessment & Plan Note (Signed)
Well-controlled.  She will continue lisinopril 20 mg daily and HCTZ 25 mg daily.  Check labs.

## 2021-10-29 LAB — COMPREHENSIVE METABOLIC PANEL
AG Ratio: 2.3 (calc) (ref 1.0–2.5)
ALT: 27 U/L (ref 6–29)
AST: 22 U/L (ref 10–35)
Albumin: 4.5 g/dL (ref 3.6–5.1)
Alkaline phosphatase (APISO): 77 U/L (ref 37–153)
BUN/Creatinine Ratio: 30 (calc) — ABNORMAL HIGH (ref 6–22)
BUN: 26 mg/dL — ABNORMAL HIGH (ref 7–25)
CO2: 27 mmol/L (ref 20–32)
Calcium: 10.1 mg/dL (ref 8.6–10.4)
Chloride: 103 mmol/L (ref 98–110)
Creat: 0.86 mg/dL (ref 0.50–1.03)
Globulin: 2 g/dL (calc) (ref 1.9–3.7)
Glucose, Bld: 142 mg/dL — ABNORMAL HIGH (ref 65–99)
Potassium: 4.2 mmol/L (ref 3.5–5.3)
Sodium: 139 mmol/L (ref 135–146)
Total Bilirubin: 0.3 mg/dL (ref 0.2–1.2)
Total Protein: 6.5 g/dL (ref 6.1–8.1)

## 2021-10-29 LAB — HEMOGLOBIN A1C
Hgb A1c MFr Bld: 6.1 % of total Hgb — ABNORMAL HIGH (ref <5.7)
Mean Plasma Glucose: 128 mg/dL
eAG (mmol/L): 7.1 mmol/L

## 2021-10-29 LAB — SPECIMEN COMPROMISED

## 2021-11-01 ENCOUNTER — Other Ambulatory Visit: Payer: Self-pay

## 2021-11-01 MED ORDER — TIRZEPATIDE 7.5 MG/0.5ML ~~LOC~~ SOAJ: 7.5000 mg | SUBCUTANEOUS | 1 refills | Status: DC

## 2021-11-17 IMAGING — DX DG LUMBAR SPINE COMPLETE 4+V
5 series · 5 of 5 positions shown · non-contrast
Comparison: MR lumbar spine dated 11/19/2019 and lumbar spine
radiographs dated 10/19/2017.

CLINICAL DATA: Low back pain several months ago followed by
intermittent bilateral leg numbness for 4 months.

EXAM:
LUMBAR SPINE - COMPLETE 4+ VIEW

[lumbar spine ap]
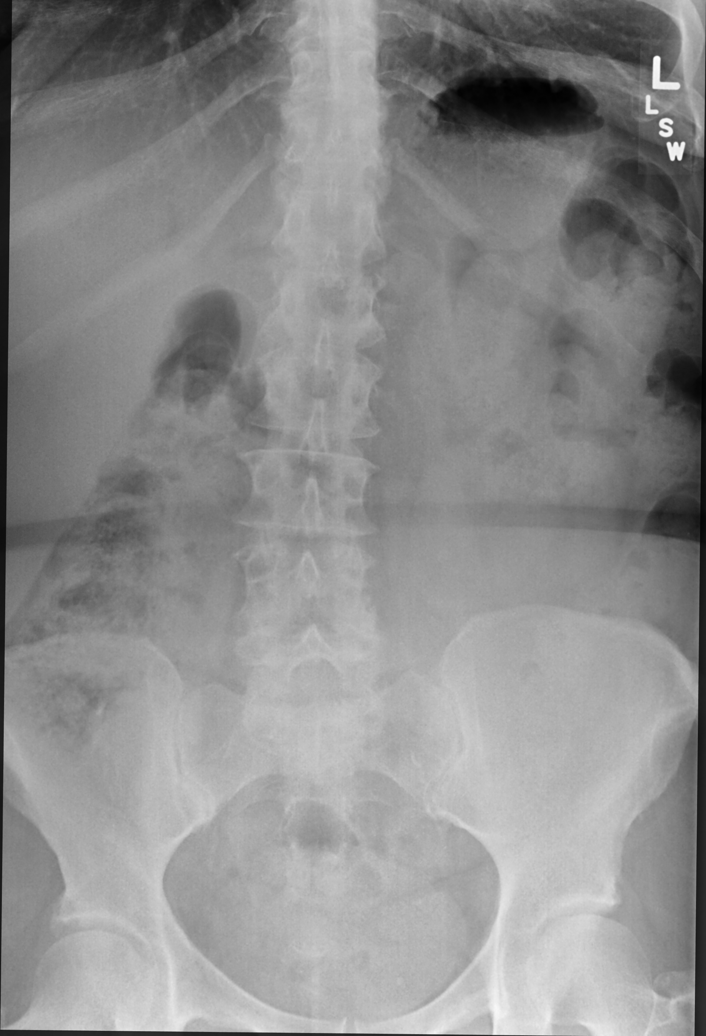

[lumbar spine obl (oblique) (1 of 2)]
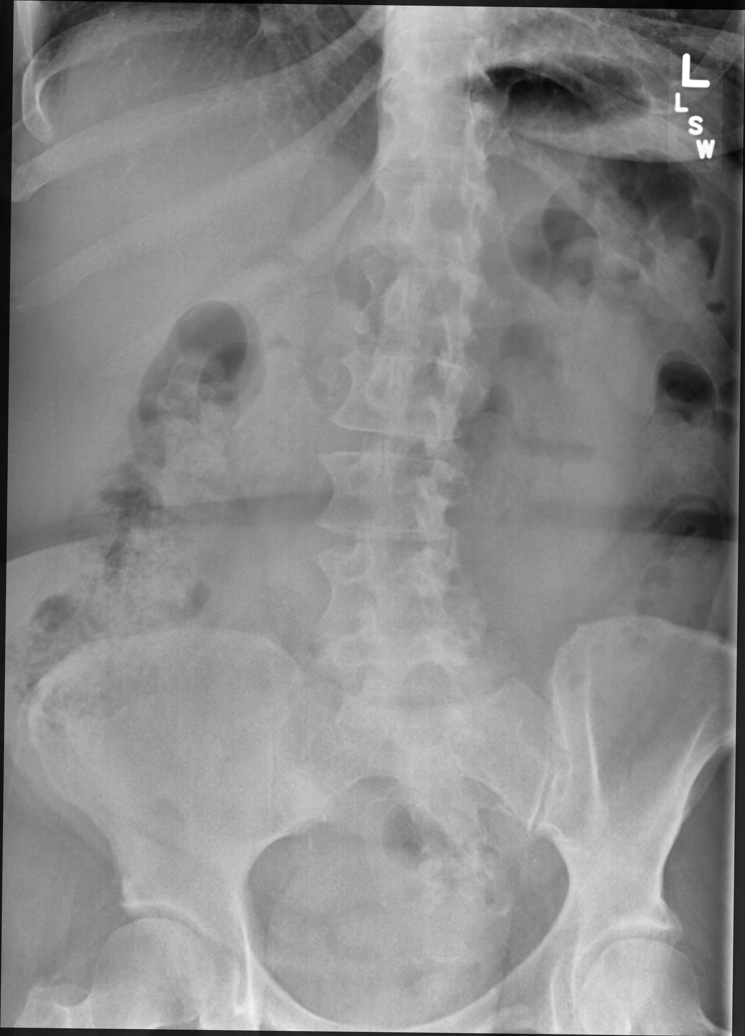

[lumbar spine obl (oblique) (2 of 2)]
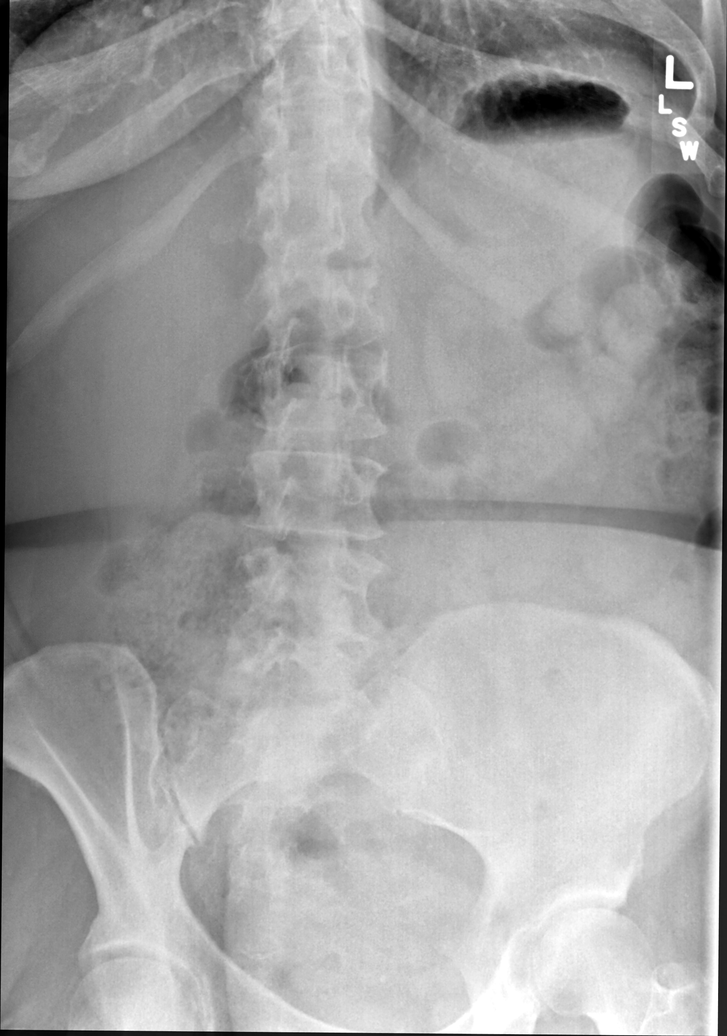

[lumbar spine lat]
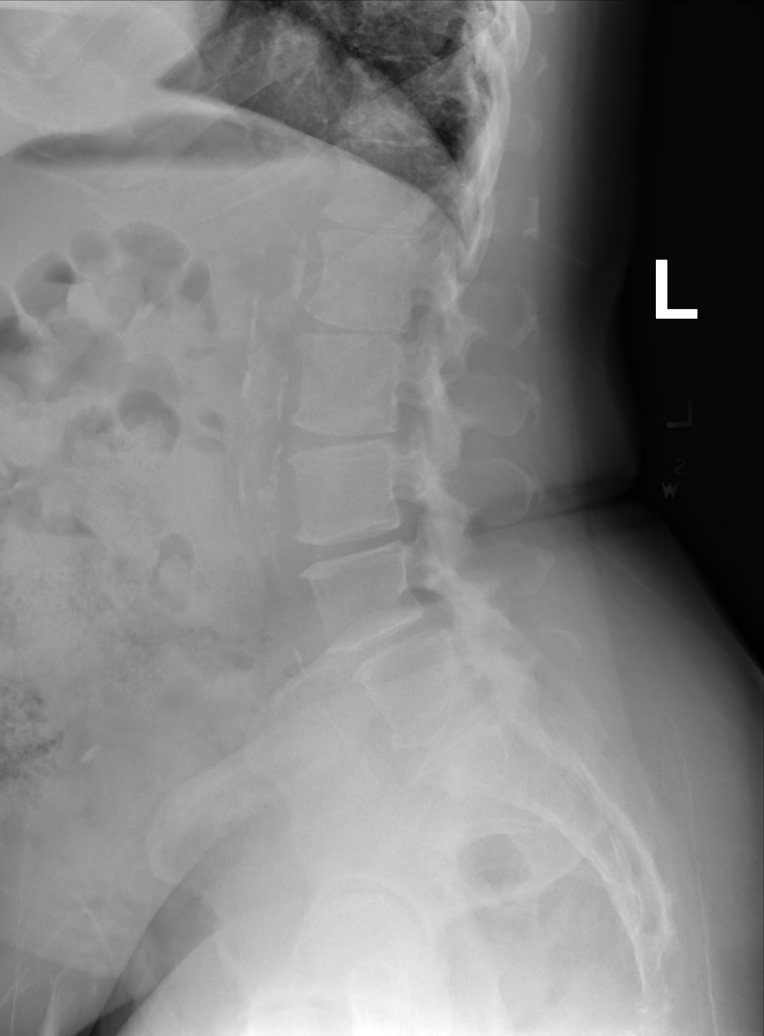

[lumbar spot lat]
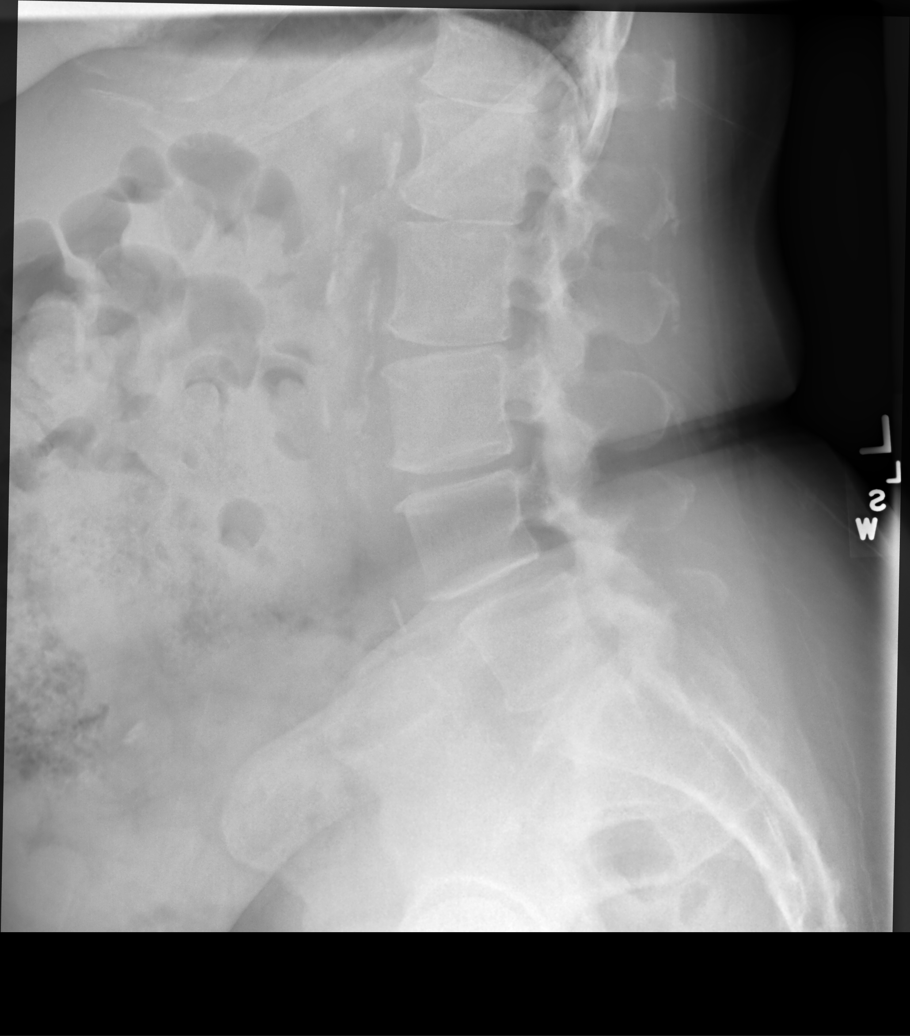

[5 of 5 positions shown; findings below may reference images not displayed]

FINDINGS: There is no evidence of lumbar spine fracture. There is 4 mm
anterolisthesis of L4 on L5, increased since prior exams. Moderate
degenerative disc and joint disease is seen in the lower lumbar
spine. There is a mild neuroforaminal stenosis at L5-S1.
IMPRESSION: Anterolisthesis of L4 on L5 has increased since prior exams and is
likely due to degenerative changes at this level. Moderate
degenerative changes in the lower lumbar spine.

## 2021-11-21 ENCOUNTER — Other Ambulatory Visit: Payer: Self-pay | Admitting: Family Medicine

## 2021-11-21 DIAGNOSIS — E782 Mixed hyperlipidemia: Secondary | ICD-10-CM

## 2021-12-03 NOTE — Telephone Encounter (Signed)
Error. ng 

## 2021-12-22 ENCOUNTER — Encounter: Payer: Self-pay | Admitting: Neurosurgery

## 2022-01-09 ENCOUNTER — Telehealth: Payer: Self-pay

## 2022-01-09 NOTE — Telephone Encounter (Signed)
I sent a message to [facility] asking them to send [place] her xrays from yesterday through [place]. I am waiting on a response from them.  The note from [facility] states they gave her an rx for prednisone and flexeril and told her to follow up with [place] if not improving. Since they already did xrays and gave her prescriptions, I think it's reasonable to make her a new patient appt with Marzetta Board or Andee Poles to be seen after she finishes these meds and she can cancel the appt if she is feeling better. You can also add her to our cancellation list. Or she can make an appt in [place] if she wishes to continue care with Dr Lacinda Axon (she had a fusion in 10/22 with Dr Lacinda Axon).

## 2022-01-09 NOTE — Telephone Encounter (Signed)
-----   Message from Peggyann Shoals sent at 01/09/2022  7:57 AM EDT ----- Regarding: back pain Contact: [phone] L4/5 TLIF on 03/07/21 Patient is calling that she has been experiencing severe back pain for 3 weeks. She finally went to [facility] walk-in yesterday. She knows she did something to her back. Can she been worked in? She is not comfortable walking, standing or sitting. She is aware that the providers are in surgery.

## 2022-01-09 NOTE — Telephone Encounter (Signed)
She confirmed appt with Stacy for 9/12 and added to the wait list.

## 2022-01-10 ENCOUNTER — Encounter: Payer: Self-pay | Admitting: Family Medicine

## 2022-01-10 ENCOUNTER — Ambulatory Visit: Payer: BC Managed Care – PPO | Admitting: Family Medicine

## 2022-01-10 VITALS — BP 128/84 | HR 67 | Temp 98.0°F | Ht <= 58 in | Wt 144.0 lb

## 2022-01-10 DIAGNOSIS — M5416 Radiculopathy, lumbar region: Secondary | ICD-10-CM | POA: Diagnosis not present

## 2022-01-10 DIAGNOSIS — E1165 Type 2 diabetes mellitus with hyperglycemia: Secondary | ICD-10-CM | POA: Diagnosis not present

## 2022-01-10 DIAGNOSIS — R350 Frequency of micturition: Secondary | ICD-10-CM

## 2022-01-10 DIAGNOSIS — I10 Essential (primary) hypertension: Secondary | ICD-10-CM | POA: Diagnosis not present

## 2022-01-10 LAB — POCT URINALYSIS DIPSTICK
Bilirubin, UA: NEGATIVE
Glucose, UA: POSITIVE — AB
Ketones, UA: NEGATIVE
Leukocytes, UA: NEGATIVE
Nitrite, UA: NEGATIVE
Protein, UA: POSITIVE — AB
Spec Grav, UA: 1.015 (ref 1.010–1.025)
Urobilinogen, UA: NEGATIVE E.U./dL — AB
pH, UA: 6 (ref 5.0–8.0)

## 2022-01-10 MED ORDER — TIRZEPATIDE 7.5 MG/0.5ML ~~LOC~~ SOAJ: 7.5000 mg | SUBCUTANEOUS | 1 refills | Status: DC

## 2022-01-10 NOTE — Progress Notes (Signed)
    SUBJECTIVE:   CHIEF COMPLAINT / HPI: back pain  Patient reports 3 weeks of back pain.  Was seen at [facility] for worsening pain 2 days ago and treated with Prednisone 40 mg and Flexeril.  No imaging at that time.  A referral was sent to Orthopedics and she is scheduled for 09/12 with PA.  Patient reports some improvement in symptoms and pain level now at 4/10. Endorses pain at right lower back travels to hip, thigh, groin and leg.  No worsening numbness or weakness of lower extremities.  She denies any fevers, incontinence of bowel or bladder and no saddle anesthesia.  Reports increase in urinary frequency without dysuria, hematuria or abdominal pain.  PERTINENT  PMH / PSH:  HTN Cervical Radiculopathy Lumbar Radiculopathy s/p spinal fusion    OBJECTIVE:   BP 128/84 (BP Location: Left Arm, Patient Position: Sitting, Cuff Size: Normal)   Pulse 67   Temp 98 F (36.7 C) (Oral)   Ht '4\' 10"'$  (1.473 m)   Wt 144 lb (65.3 kg)   SpO2 99%   BMI 30.10 kg/m    General: Alert, no acute distress Cardio: Normal S1 and S2, RRR, no r/m/g Pulm: CTAB, normal work of breathing Abdomen: Bowel sounds normal. Abdomen soft and non-tender.  Extremities: No peripheral edema.  Hip Exam: No deformity. FROM with 5/5 strength. No tenderness to palpation. NVI distally. Negative logroll Negative faber, fadir, and piriformis stretches.   Back - Normal skin, Spine with normal alignment and no deformity.  No tenderness to vertebral process palpation.  Paraspinous muscles are not tender and without spasm.   Range of motion is full at neck and  limited at lumbar sacral regions secondary to fusion.   ASSESSMENT/PLAN:   Lumbar radiculopathy Suspect radicular pain.   -Continue Prednisone 20 mg BID -Continue Flexeril 10 mg TID as needed -Heat/Ice as needed -Follow up with Orthopedics as scheduled 09/12 -Strict return precautions provided   Uncontrolled type 2 diabetes mellitus with hyperglycemia  (HCC) Last A1c 6.1.   -Refill Mounjaro 7.5 mg s/c weekly -Follow up with PCP for diabetic management   Benign essential HTN Initially elevated.  Recheck almost at goal.  Given pain will not increase medications. -Continue Lisinopril 20 mg daily -Continue HCTZ 25 mg daily -Recheck at next visit   Urine frequency Increased urinary frequency, groin pain and back pain. Considered nephrolithiasis, UTI in differential. Negative CVA tenderness. -POC urine positive for RBC 2+ -Will send for U/A and culture.   -Follow up with results     PDMP Reviewed  Carollee Leitz, MD

## 2022-01-10 NOTE — Patient Instructions (Addendum)
It was a pleasure meeting you today. Thank you for allowing me to take part in your health care.  Our goals for today as we discussed include:  For your back Continue prednisone 20 mg twice daily and Flexeril 5 mg 3 times daily as previously prescribed. Can use diclofenac gel 4 times a day as needed Can use Biofreeze gel 3-4 times daily as needed I cannot see the results of your back x-rays from [place] [facility]. You have a follow-up appointment with orthopedics surgery on 9/12 at 10:30 AM with Geronimo Boot, PA  Your urine is negative for infection.  It did show some blood in your urine.  I will send it off for further evaluation.  Sometimes this is indicative of a urine infection or possible kidney stones.  I have sent a refill in for your [facility]. Monitor your blood pressures more closely given that you are now on steroids which can increase your blood sugars.  If your sugars are greater than 400 please call MD or go to urgent care.  Please follow-up with PCP on 9/15 as scheduled or sooner if needed.  If you have any questions or concerns, please do not hesitate to call the office at [phone].  I look forward to our next visit and until then take care and stay safe.  Regards,   Carollee Leitz, MD   [facility]

## 2022-01-11 ENCOUNTER — Other Ambulatory Visit: Payer: Self-pay

## 2022-01-11 ENCOUNTER — Ambulatory Visit
Admission: RE | Admit: 2022-01-11 | Discharge: 2022-01-11 | Disposition: A | Discharge status: Home / Self Care | Payer: Self-pay | Source: Ambulatory Visit | Attending: Orthopedic Surgery | Admitting: Orthopedic Surgery

## 2022-01-11 DIAGNOSIS — Z049 Encounter for examination and observation for unspecified reason: Secondary | ICD-10-CM

## 2022-01-11 LAB — URINE CULTURE
MICRO NUMBER:: 13814240
SPECIMEN QUALITY:: ADEQUATE

## 2022-01-11 LAB — URINALYSIS, MICROSCOPIC ONLY

## 2022-01-20 ENCOUNTER — Encounter: Payer: Self-pay | Admitting: Family Medicine

## 2022-01-20 NOTE — Assessment & Plan Note (Signed)
Suspect radicular pain.   -Continue Prednisone 20 mg BID -Continue Flexeril 10 mg TID as needed -Heat/Ice as needed -Follow up with Orthopedics as scheduled 09/12 -Strict return precautions provided

## 2022-01-20 NOTE — Assessment & Plan Note (Signed)
Last A1c 6.1.   -Refill Mounjaro 7.5 mg s/c weekly -Follow up with PCP for diabetic management

## 2022-01-20 NOTE — Assessment & Plan Note (Signed)
Initially elevated.  Recheck almost at goal.  Given pain will not increase medications. -Continue Lisinopril 20 mg daily -Continue HCTZ 25 mg daily -Recheck at next visit

## 2022-01-20 NOTE — Assessment & Plan Note (Signed)
Increased urinary frequency, groin pain and back pain. Considered nephrolithiasis, UTI in differential. Negative CVA tenderness. -POC urine positive for RBC 2+ -Will send for U/A and culture.   -Follow up with results

## 2022-01-27 NOTE — Progress Notes (Addendum)
Referring Physician:  Mariana Arn, MD [address] [place],  [place] [address]  Primary Physician:  Leone Haven, MD  History of Present Illness:  History of DM, COPD, OSA, and HTN. 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   She smokes 1/2 ppd.   01/31/2022 Ms. Anmarie Fukushima is here today with a chief complaint of intermittent right sided LBP with radiation to her right thigh to her knee that is constant x 4 weeks. She has occasional pain in right groin as well. Right leg pain > LBP. No left leg pain. Pain wakes her up at night. She has numbness and tinging in right knee. No known injury. She notes some weakness in her right leg.   She had improvement with prednisone. No relief with flexeril. Doesn't think voltaren is helping.   Conservative measures:  Physical therapy: no. Has seen chiropractor.   Multimodal medical therapy including regular antiinflammatories: prednisone, flexeril, voltaren Injections: No recent epidural steroid injections  Past Surgery: lumbar surgery as above  Daaiyah A Charbonneau has no symptoms of cervical myelopathy.  The symptoms are causing a significant impact on the patient's life.   Review of Systems:  A 10 point review of systems is negative, except for the pertinent positives and negatives detailed in the HPI.  Past Medical History: Past Medical History:  Diagnosis Date   Arthritis    Chicken pox    Diabetes mellitus without complication ([place])    Diabetic eye exam ([place])    History of 2019 novel coronavirus disease (COVID-19) 01/13/2021   History of mammogram 08/16/2012   neg   History of Papanicolaou smear of cervix 06/21/2012   neg/neg   Hyperlipidemia    Hypertension    Hypothyroidism (acquired)    Obesity    Sleep apnea    Thyroid disease     Past Surgical History: Past Surgical History:  Procedure Laterality Date   ABDOMINAL HYSTERECTOMY  2012   Cervix & ovaries intact   APPENDECTOMY     CESAREAN SECTION      COLONOSCOPY WITH PROPOFOL N/A 11/05/2015   Procedure: COLONOSCOPY WITH PROPOFOL;  Surgeon: Lollie Sails, MD;  Location: [facility] ENDOSCOPY;  Service: Endoscopy;  Laterality: N/A;   MAXIMUM ACCESS (MAS) TRANSFORAMINAL LUMBAR INTERBODY FUSION (TLIF) 1 LEVEL N/A 03/07/2021   Procedure: L4-5 MAXIMUM ACCESS (MAS) TRANSFORAMINAL LUMBAR INTERBODY FUSION (TLIF) 1 LEVEL;  Surgeon: Deetta Perla, MD;  Location: [facility] ORS;  Service: Neurosurgery;  Laterality: N/A;    Allergies: Allergies as of 01/31/2022 - Review Complete 01/20/2022  Allergen Reaction Noted   Codeine Itching 07/21/2015    Medications: Outpatient Encounter Medications as of 01/31/2022  Medication Sig   albuterol (VENTOLIN HFA) 108 (90 Base) MCG/ACT inhaler Inhale 1 puff into the lungs every 6 (six) hours as needed for wheezing or shortness of breath.   diclofenac (VOLTAREN) 75 MG EC tablet TAKE 1 TABLET TWICE A DAY AS NEEDED FOR MILD PAIN   escitalopram (LEXAPRO) 10 MG tablet TAKE 1 TABLET DAILY   ezetimibe (ZETIA) 10 MG tablet TAKE 1 TABLET DAILY   glucose blood (CONTOUR TEST) test strip Use as instructed   hydrochlorothiazide (HYDRODIURIL) 25 MG tablet TAKE 1 TABLET DAILY   levothyroxine (SYNTHROID) 125 MCG tablet TAKE 1 TABLET DAILY   lisinopril (ZESTRIL) 20 MG tablet TAKE 1 TABLET DAILY   metFORMIN (GLUCOPHAGE) 500 MG tablet TAKE 1 TABLET TWICE A DAY WITH MEALS   Multiple Vitamin (MULTIVITAMIN) tablet Take 1 tablet by mouth daily.  rosuvastatin (CRESTOR) 40 MG tablet TAKE 1 TABLET DAILY   tirzepatide (MOUNJARO) 7.5 MG/0.5ML Pen Inject 7.5 mg into the skin once a week.   No facility-administered encounter medications on file as of 01/31/2022.    Social History: Social History   Tobacco Use   Smoking status: Every Day    Packs/day: 0.50    Years: 42.00    Total pack years: 21.00    Types: Cigarettes   Smokeless tobacco: Never   Tobacco comments:    working on quitting  Vaping Use   Vaping Use: Never used   Substance Use Topics   Alcohol use: Yes    Alcohol/week: 0.0 - 1.0 standard drinks of alcohol    Comment: Rare usage    Drug use: No    Family Medical History: Family History  Problem Relation Age of Onset   Arthritis Mother    Hyperlipidemia Mother    Cervical cancer Mother 75       Malignant   Alcohol abuse Father    Hyperlipidemia Father    Heart disease Father    Hypertension Father    Stroke Maternal Grandmother    Diabetes Maternal Grandmother    Arthritis Maternal Grandfather    Diabetes Maternal Grandfather        DM Type 2   Asthma Paternal Grandmother    Heart disease Paternal Grandmother    Lung cancer Paternal Grandmother        Malignant   Prostate cancer Paternal Grandfather    Hyperlipidemia Paternal Grandfather    Hypertension Paternal Grandfather    Heart disease Paternal Grandfather    Lung cancer Paternal Grandfather    Breast cancer Neg Hx     Physical Examination: There were no vitals filed for this visit.  General: Patient is well developed, well nourished, calm, collected, and in no apparent distress. Attention to examination is appropriate  Respiratory: Patient is breathing without any difficulty.   NEUROLOGICAL:     Awake, alert, oriented to person, place, and time.  Speech is clear and fluent. Fund of knowledge is appropriate.   Cranial Nerves: Pupils equal round and reactive to light.  Facial tone is symmetric.  Facial sensation is symmetric.  ROM of spine:  Limited ROM of lumbar spine with mild pain  No abnormal lesions on exposed skin.   Strength: Side Biceps Triceps Deltoid Interossei Grip Wrist Ext. Wrist Flex.  R '5 5 5 5 5 5 5  '$ L '5 5 5 5 5 5 5   '$ Side Iliopsoas Quads Hamstring PF DF EHL  R '5 5 5 5 5 5  '$ L '5 5 5 5 5 5   '$ Reflexes are 3+ and symmetric at the biceps, triceps, brachioradialis, patella and achilles.   Hoffman's is positive. Clonus is not present.   Bilateral upper and lower extremity sensation is intact to  light touch, but subjectively decreased right thigh and medial calf.  No evidence of dysmetria noted.  Gait is normal.    Medical Decision Making  Imaging: Lumbar xrays 01/08/22:  Instrumented fusion L4-L5 with hardware in good position.   Report scanned under media.   I have personally reviewed the images and agree with the above interpretation.  Assessment and Plan: Ms. Quackenbush is a pleasant 56 y.o. female with 4 week histoh of intermittent right sided LBP with radiation to her right thigh to her knee that is constant. She has occasional pain in right groin as well. Right leg pain > LBP.  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 Fusion looks good on xrays. Pain likely from L3-L4.   Above treatment options discussed with patient and following plan made:   - MRI of lumbar spine to further evaluate right LE radiculopathy.  - Started on neurontin '300mg'$  at night to increase to '600mg'$  after 3 days. Reviewed dosing and side effects.  - Minimal relief with prednisone. No relief with voltaren.  - Discussed PT. She wants to hold until MRI done.  - Will set up for phone visit after MRI is done to review results. Will likely consider PT and/or ESIs.  - She does have positive hoffmans and is hyper-reflexic. No other signs/symptoms of cervical myelopathy. Reviewed with Dr. Izora Ribas. Will follow for now. Consider cervical MRI if she develops any symptoms.   I spent a total of 30 minutes in face-to-face and non-face-to-face activities related to this patient's care today.  Thank you for involving me in the care of this patient.   Geronimo Boot PA-C Dept. of Neurosurgery

## 2022-01-31 ENCOUNTER — Ambulatory Visit: Payer: BC Managed Care – PPO | Admitting: Orthopedic Surgery

## 2022-01-31 ENCOUNTER — Encounter: Payer: Self-pay | Admitting: Orthopedic Surgery

## 2022-01-31 VITALS — BP 104/64 | Ht <= 58 in | Wt 141.4 lb

## 2022-01-31 DIAGNOSIS — M5441 Lumbago with sciatica, right side: Secondary | ICD-10-CM | POA: Diagnosis not present

## 2022-01-31 DIAGNOSIS — Z981 Arthrodesis status: Secondary | ICD-10-CM

## 2022-01-31 DIAGNOSIS — M5416 Radiculopathy, lumbar region: Secondary | ICD-10-CM | POA: Diagnosis not present

## 2022-01-31 DIAGNOSIS — Z09 Encounter for follow-up examination after completed treatment for conditions other than malignant neoplasm: Secondary | ICD-10-CM

## 2022-01-31 MED ORDER — GABAPENTIN 300 MG PO CAPS: ORAL_CAPSULE | ORAL | 1 refills | Status: DC

## 2022-01-31 NOTE — Patient Instructions (Signed)
It was so nice to see you today, I am sorry that you are hurting so much.   Your lumbar xrays looked good. I want to get an MRI of your lower back to look into things further. We will call you to set this up.   I sent a prescription for gabapentin to help with pain. Take as directed. Can make you sleepy. Call if any questions.   Will set up phone call visit once I get MRI results.   Please do not hesitate to call if you have any questions or concerns. You can also message me in [place].   If you have not heard back about any of the tests/procedures in the next week, please call the office so we can help you get these things scheduled.   Geronimo Boot PA-C [phone]

## 2022-02-03 ENCOUNTER — Ambulatory Visit: Payer: BC Managed Care – PPO | Admitting: Family Medicine

## 2022-02-03 ENCOUNTER — Encounter: Payer: Self-pay | Admitting: Family Medicine

## 2022-02-03 VITALS — BP 90/60 | HR 88 | Temp 98.6°F | Ht <= 58 in | Wt 141.8 lb

## 2022-02-03 DIAGNOSIS — E1165 Type 2 diabetes mellitus with hyperglycemia: Secondary | ICD-10-CM | POA: Diagnosis not present

## 2022-02-03 DIAGNOSIS — G4733 Obstructive sleep apnea (adult) (pediatric): Secondary | ICD-10-CM | POA: Diagnosis not present

## 2022-02-03 DIAGNOSIS — E663 Overweight: Secondary | ICD-10-CM

## 2022-02-03 DIAGNOSIS — Z23 Encounter for immunization: Secondary | ICD-10-CM

## 2022-02-03 DIAGNOSIS — M5441 Lumbago with sciatica, right side: Secondary | ICD-10-CM

## 2022-02-03 LAB — POCT GLYCOSYLATED HEMOGLOBIN (HGB A1C): Hemoglobin A1C: 7.1 % — AB (ref 4.0–5.6)

## 2022-02-03 MED ORDER — TIRZEPATIDE 10 MG/0.5ML ~~LOC~~ SOAJ: 10.0000 mg | SUBCUTANEOUS | 3 refills | Status: DC

## 2022-02-03 NOTE — Assessment & Plan Note (Signed)
Patient has symptoms of nerve impingement in her lumbar spine.  She will complete her MRI as scheduled.  She will continue to see neurosurgery.

## 2022-02-03 NOTE — Assessment & Plan Note (Signed)
A1c increased from last visit.  We will increase her Mounjaro to 10 mg weekly.  She will monitor for nausea and abdominal pain.  If she develops abdominal pain she will discontinue the [facility] and let us know or be evaluated.  If she has nausea she will let us know.  If she has excessive weight loss she will let us know as well.

## 2022-02-03 NOTE — Assessment & Plan Note (Signed)
Patient will continue to work on her diet.  She is on [facility] which will help with weight loss as well.

## 2022-02-03 NOTE — Progress Notes (Signed)
Brenda Rumps, MD Phone: [phone]  Brenda Farmer is a 56 y.o. female who presents today for follow-up.  OSA: Patient uses her CPAP nightly.  No hypersomnia.  She does wake well rested.  She uses [employer] though she does not insert the chip to transmit her data.  Diabetes: Fastings have been 150-160, she reports rest of the day is good.  She is on Mounjaro metformin.  No polyuria or polydipsia.  No hypoglycemia.  Overweight: Patient's weight has decreased with the Mounjaro.  She has not been able to exercise given back issues.  She is not eating much red meat.  She is cut out carbs.  Back pain: This started 6 weeks ago with a small twinge in her back.  She had done lots of walking.  She subsequently woke up 1 morning with significant pain in her back.  She was evaluated at the walk-in clinic and reports having an x-ray and being placed on a steroid and muscle relaxer without benefit.  She notes the pain was radiating down the front of her right leg.  Subsequently her right thigh became numb throughout the entire circumference.  She saw neurosurgery earlier this week.  They started her on Neurontin and the patient notes that has been quite helpful.  She has an MRI scheduled for next week.  She notes no incontinence.  She does note some hip flexion weakness.  Social History   Tobacco Use  Smoking Status Every Day   Packs/day: 0.50   Years: 42.00   Total pack years: 21.00   Types: Cigarettes  Smokeless Tobacco Never  Tobacco Comments   1/2 pack per day    Current Outpatient Medications on File Prior to Visit  Medication Sig Dispense Refill   albuterol (VENTOLIN HFA) 108 (90 Base) MCG/ACT inhaler Inhale 1 puff into the lungs every 6 (six) hours as needed for wheezing or shortness of breath. 8 g 0   diclofenac (VOLTAREN) 75 MG EC tablet TAKE 1 TABLET TWICE A DAY AS NEEDED FOR MILD PAIN 180 tablet 3   escitalopram (LEXAPRO) 10 MG tablet TAKE 1 TABLET DAILY 90 tablet 3   ezetimibe  (ZETIA) 10 MG tablet TAKE 1 TABLET DAILY 90 tablet 3   gabapentin (NEURONTIN) 300 MG capsule 1 po q hs x 3 days, then can increase to 2 po q hs if needed. 60 capsule 1   glucose blood (CONTOUR TEST) test strip Use as instructed 100 each 12   hydrochlorothiazide (HYDRODIURIL) 25 MG tablet TAKE 1 TABLET DAILY 90 tablet 3   levothyroxine (SYNTHROID) 125 MCG tablet TAKE 1 TABLET DAILY 90 tablet 3   lisinopril (ZESTRIL) 20 MG tablet TAKE 1 TABLET DAILY 90 tablet 3   metFORMIN (GLUCOPHAGE) 500 MG tablet TAKE 1 TABLET TWICE A DAY WITH MEALS 180 tablet 3   Multiple Vitamin (MULTIVITAMIN) tablet Take 1 tablet by mouth daily.     rosuvastatin (CRESTOR) 40 MG tablet TAKE 1 TABLET DAILY 90 tablet 3   No current facility-administered medications on file prior to visit.     ROS see history of present illness  Objective  Physical Exam Vitals:   02/03/22 1450  BP: 90/60  Pulse: 88  Temp: 98.6 F (37 C)  SpO2: 96%    BP Readings from Last 3 Encounters:  02/03/22 90/60  01/31/22 104/64  01/10/22 128/84   Wt Readings from Last 3 Encounters:  02/03/22 141 lb 12.8 oz (64.3 kg)  01/31/22 141 lb 6.4 oz (64.1 kg)  01/10/22  144 lb (65.3 kg)    Physical Exam Constitutional:      General: She is not in acute distress.    Appearance: She is not diaphoretic.  Cardiovascular:     Rate and Rhythm: Normal rate and regular rhythm.     Heart sounds: Normal heart sounds.  Pulmonary:     Effort: Pulmonary effort is normal.     Breath sounds: Normal breath sounds.  Musculoskeletal:     Comments: No midline spine tenderness, no midline spine step-off, no muscular back tenderness  Skin:    General: Skin is warm and dry.  Neurological:     Mental Status: She is alert.     Comments: 5/5 strength bilateral quads, hamstrings, plantarflexion, and dorsiflexion, sensation to light touch reduced in her right thigh though otherwise intact in her right lower extremity and left lower extremity       Assessment/Plan: Please see individual problem list.  Problem List Items Addressed This Visit     Overweight (Chronic)    Patient will continue to work on her diet.  She is on [facility] which will help with weight loss as well.      Uncontrolled type 2 diabetes mellitus with hyperglycemia (HCC) (Chronic)    A1c increased from last visit.  We will increase her Mounjaro to 10 mg weekly.  She will monitor for nausea and abdominal pain.  If she develops abdominal pain she will discontinue the [facility] and let us know or be evaluated.  If she has nausea she will let us know.  If she has excessive weight loss she will let us know as well.      Relevant Medications   tirzepatide (MOUNJARO) 10 MG/0.5ML Pen   Other Relevant Orders   POCT HgB A1C (Completed)   Low back pain    Patient has symptoms of nerve impingement in her lumbar spine.  She will complete her MRI as scheduled.  She will continue to see neurosurgery.      OSA (obstructive sleep apnea) - Primary    Symptomatically well-controlled.  We will see if we get a copy of her compliance report.  She will continue her CPAP.      Other Visit Diagnoses     Need for immunization against influenza       Relevant Orders   Flu Vaccine QUAD 49moIM (Fluarix, Fluzone & Alfiuria Quad PF) (Completed)        Return in about 3 months (around 05/05/2022) for Diabetes/weight follow-up.   ETommi Rumps MD [place]

## 2022-02-03 NOTE — Assessment & Plan Note (Signed)
Symptomatically well-controlled.  We will see if we get a copy of her compliance report.  She will continue her CPAP.

## 2022-02-03 NOTE — Patient Instructions (Addendum)
Nice to see you. We will increase her Mounjaro to 10 mg weekly.  If you develop any nausea please let us know.  If you have abdominal pain please discontinue the [facility] and let us know or get evaluated. If you rapidly start to lose weight please let us know.

## 2022-02-07 ENCOUNTER — Ambulatory Visit
Admission: RE | Admit: 2022-02-07 | Discharge: 2022-02-07 | Disposition: A | Discharge status: Home / Self Care | Payer: BC Managed Care – PPO | Source: Ambulatory Visit | Attending: Orthopedic Surgery | Admitting: Orthopedic Surgery

## 2022-02-07 ENCOUNTER — Other Ambulatory Visit: Payer: Self-pay | Admitting: Family Medicine

## 2022-02-07 DIAGNOSIS — E119 Type 2 diabetes mellitus without complications: Secondary | ICD-10-CM

## 2022-02-07 DIAGNOSIS — M5441 Lumbago with sciatica, right side: Secondary | ICD-10-CM | POA: Insufficient documentation

## 2022-02-07 DIAGNOSIS — M5416 Radiculopathy, lumbar region: Secondary | ICD-10-CM | POA: Diagnosis present

## 2022-02-07 DIAGNOSIS — Z981 Arthrodesis status: Secondary | ICD-10-CM | POA: Diagnosis not present

## 2022-02-07 NOTE — Telephone Encounter (Signed)
I called the patient and LVM for her to call back to see if she is still taking this medication.  Will Schier,cma

## 2022-02-10 ENCOUNTER — Other Ambulatory Visit: Payer: Self-pay | Admitting: Orthopedic Surgery

## 2022-02-10 DIAGNOSIS — Z981 Arthrodesis status: Secondary | ICD-10-CM

## 2022-02-10 DIAGNOSIS — M5441 Lumbago with sciatica, right side: Secondary | ICD-10-CM

## 2022-02-10 DIAGNOSIS — M5416 Radiculopathy, lumbar region: Secondary | ICD-10-CM

## 2022-02-14 ENCOUNTER — Encounter: Payer: Self-pay | Admitting: Orthopedic Surgery

## 2022-02-14 NOTE — Progress Notes (Signed)
MRI of lumbar spine dated 02/11/22:  FINDINGS: Segmentation:  5 lumbar type vertebrae   Alignment:  Mild dextrocurvature   Vertebrae: Interval L4-5 PLIF. No fracture, discitis, or aggressive bone lesion.   Conus medullaris and cauda equina: Conus extends to the L1 level. Conus and cauda equina appear normal.   Paraspinal and other soft tissues: Expected postoperative scarring in the posterior soft tissues.   Disc levels:   L2-L3: Right paracentral to foraminal mass, extradural appearing and nonenhancing. This is consistent with a herniation and possible fragmentation. There is an underlying chronic right foraminal protrusion at this level. A herniation would also fit with the acute clinical symptomatology. Advanced right L2 and descending L3 neural impingement. Moderate spinal stenosis.   L3-L4: Disc narrowing and foraminal predominant bulging with right foraminal annular fissure. Mild facet spurring. Moderate spinal stenosis when degenerative changes are combined with short pedicles   L4-L5: Interval decompression and fusion. Left subarticular recess remains narrowed but the descending L5 nerve root appears to traverse without compression   L5-S1:Mild facet spurring.   IMPRESSION: 1. 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 Pronounced right L2 and L3 impingement with moderate thecal sac narrowing. 2. Interval L4-5 fusion and decompression without complicating features. 3. L3-4 chronic moderate thecal sac narrowing from bulging disc and short pedicles.     Electronically Signed   By: Jorje Guild M.D.   On: 02/11/2022 08:41  Above MRI reviewed with Dr. Izora Ribas. Etiology of mass at D9-R4 is not certain. He wants to see her in clinic to review and discuss options with her. Patient called and we discussed results. Will have staff call her to schedule appointment with him.

## 2022-02-20 LAB — HM DIABETES EYE EXAM

## 2022-02-27 NOTE — H&P (View-Only) (Signed)
Referring Physician:  Geronimo Boot, PA-C [address] [place],  [place] [address]  Primary Physician:  Leone Haven, MD  History of Present Illness: 02/28/2022 Brenda Farmer is here today with a chief complaint of some right sided low back pain with numbness and tingling into the right thigh and stops at the shin. She continues to have some right anterior thigh discomfort as well as weakness in her right leg.  She is having some trouble with walking.  Prolonged standing makes her discomfort worse.  Medication has helped but not limited to her discomfort.   Bowel/Bladder Dysfunction: none  Conservative measures:  Physical therapy: has not participated in Multimodal medical therapy including regular antiinflammatories: prednisone, flexeril, voltaren, gabapentin Injections: has not had epidural steroid injections  Past Surgery: 03/07/21: L4-5 transforaminal  lumbar interbody fusion by Dr. Lance Bosch has no symptoms of cervical myelopathy.  The symptoms are causing a significant impact on the patient's life.   Review of Systems:  A 10 point review of systems is negative, except for the pertinent positives and negatives detailed in the HPI.  Past Medical History: Past Medical History:  Diagnosis Date   Arthritis    Chicken pox    Diabetes mellitus without complication ([place])    Diabetic eye exam ([place])    History of 2019 novel coronavirus disease (COVID-19) 01/13/2021   History of mammogram 08/16/2012   neg   History of Papanicolaou smear of cervix 06/21/2012   neg/neg   Hyperlipidemia    Hypertension    Hypothyroidism (acquired)    Obesity    Sleep apnea    Thyroid disease     Past Surgical History: Past Surgical History:  Procedure Laterality Date   ABDOMINAL HYSTERECTOMY  2012   Cervix & ovaries intact   APPENDECTOMY     CESAREAN SECTION     COLONOSCOPY WITH PROPOFOL N/A 11/05/2015   Procedure: COLONOSCOPY WITH PROPOFOL;   Surgeon: Lollie Sails, MD;  Location: [facility] ENDOSCOPY;  Service: Endoscopy;  Laterality: N/A;   MAXIMUM ACCESS (MAS) TRANSFORAMINAL LUMBAR INTERBODY FUSION (TLIF) 1 LEVEL N/A 03/07/2021   Procedure: L4-5 MAXIMUM ACCESS (MAS) TRANSFORAMINAL LUMBAR INTERBODY FUSION (TLIF) 1 LEVEL;  Surgeon: Deetta Perla, MD;  Location: [facility] ORS;  Service: Neurosurgery;  Laterality: N/A;    Allergies: Allergies as of 02/28/2022 - Review Complete 02/28/2022  Allergen Reaction Noted   Codeine Itching 07/21/2015    Medications: Current Meds  Medication Sig   albuterol (VENTOLIN HFA) 108 (90 Base) MCG/ACT inhaler Inhale 1 puff into the lungs every 6 (six) hours as needed for wheezing or shortness of breath.   diclofenac (VOLTAREN) 75 MG EC tablet TAKE 1 TABLET TWICE A DAY AS NEEDED FOR MILD PAIN   escitalopram (LEXAPRO) 10 MG tablet TAKE 1 TABLET DAILY   ezetimibe (ZETIA) 10 MG tablet TAKE 1 TABLET DAILY   gabapentin (NEURONTIN) 300 MG capsule 1 po q hs x 3 days, then can increase to 2 po q hs if needed.   glucose blood (CONTOUR TEST) test strip Use as instructed   hydrochlorothiazide (HYDRODIURIL) 25 MG tablet TAKE 1 TABLET DAILY   JARDIANCE 25 MG TABS tablet TAKE 1 TABLET DAILY   levothyroxine (SYNTHROID) 125 MCG tablet TAKE 1 TABLET DAILY   lisinopril (ZESTRIL) 20 MG tablet TAKE 1 TABLET DAILY   metFORMIN (GLUCOPHAGE) 500 MG tablet TAKE 1 TABLET TWICE A DAY WITH MEALS   Multiple Vitamin (MULTIVITAMIN) tablet Take 1 tablet by mouth  daily.   rosuvastatin (CRESTOR) 40 MG tablet TAKE 1 TABLET DAILY   tirzepatide (MOUNJARO) 10 MG/0.5ML Pen Inject 10 mg into the skin once a week.    Social History: Social History   Tobacco Use   Smoking status: Every Day    Packs/day: 0.50    Years: 42.00    Total pack years: 21.00    Types: Cigarettes   Smokeless tobacco: Never   Tobacco comments:    1/2 pack per day  Vaping Use   Vaping Use: Never used  Substance Use Topics   Alcohol use: Yes     Alcohol/week: 0.0 - 1.0 standard drinks of alcohol    Comment: Rare usage    Drug use: No    Family Medical History: Family History  Problem Relation Age of Onset   Arthritis Mother    Hyperlipidemia Mother    Cervical cancer Mother 33       Malignant   Alcohol abuse Father    Hyperlipidemia Father    Heart disease Father    Hypertension Father    Stroke Maternal Grandmother    Diabetes Maternal Grandmother    Arthritis Maternal Grandfather    Diabetes Maternal Grandfather        DM Type 2   Asthma Paternal Grandmother    Heart disease Paternal Grandmother    Lung cancer Paternal Grandmother        Malignant   Prostate cancer Paternal Grandfather    Hyperlipidemia Paternal Grandfather    Hypertension Paternal Grandfather    Heart disease Paternal Grandfather    Lung cancer Paternal Grandfather    Breast cancer Neg Hx     Physical Examination: Vitals:   02/28/22 1505  BP: 108/68    General: Patient is well developed, well nourished, calm, collected, and in no apparent distress. Attention to examination is appropriate.  Neck:   Supple.  Full range of motion.  Respiratory: Patient is breathing without any difficulty.   NEUROLOGICAL:     Awake, alert, oriented to person, place, and time.  Speech is clear and fluent. Fund of knowledge is appropriate.   Cranial Nerves: Pupils equal round and reactive to light.  Facial tone is symmetric.  Facial sensation is symmetric. Shoulder shrug is symmetric. Tongue protrusion is midline.  There is no pronator drift.  ROM of spine: full.    Strength: Side Biceps Triceps Deltoid Interossei Grip Wrist Ext. Wrist Flex.  R '5 5 5 5 5 5 5  '$ L '5 5 5 5 5 5 5   '$ Side Iliopsoas Quads Hamstring PF DF EHL  R 3 4- '5 5 5 5  '$ L '5 5 5 5 5 5   '$ Reflexes are 1+ and symmetric at the biceps, triceps, brachioradialis, patella and achilles.   Hoffman's is absent.   Bilateral upper and lower extremity sensation is intact to light touch.    No  evidence of dysmetria noted.  Gait is abnormal due to right iliopsoas weakness.     Medical Decision Making  Imaging: MRI L spine 02/11/22 IMPRESSION: 1. 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 Pronounced right L2 and L3 impingement with moderate thecal sac narrowing. 2. Interval L4-5 fusion and decompression without complicating features. 3. L3-4 chronic moderate thecal sac narrowing from bulging disc and short pedicles.     Electronically Signed   By: Jorje Guild M.D.   On: 02/11/2022 08:41    I have personally reviewed the images and  agree with the above interpretation.  Assessment and Plan: Brenda Farmer is a pleasant 56 y.o. 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  She is having falls from this.  While her pain is slightly better with gabapentin, she continues to have significant weakness that is complicating her ability to get about her day-to-day life.  Given her objective weakness, I recommended surgical intervention.  We did discuss the option of watching closely while she starts therapy, but she has had several near falls and is worried about her ability to continue like this.  I think it is reasonable to move forward given her weakness.  I discussed the planned procedure at length with the patient, including the risks, benefits, alternatives, and indications. The risks discussed include but are not limited to bleeding, infection, need for reoperation, spinal fluid leak, stroke, vision loss, anesthetic complication, coma, paralysis, and even death. I also described in detail that improvement was not guaranteed.  The patient expressed understanding of these risks, and asked that we proceed with surgery. I described the surgery in layman's terms, and gave ample opportunity for questions, which were answered to the best of my ability.    I spent a total of 30 minutes in  face-to-face and non-face-to-face activities related to this patient's care today.  Thank you for involving me in the care of this patient.      Brenda Kujala K. Izora Ribas MD, [facility]

## 2022-02-27 NOTE — Progress Notes (Unsigned)
Referring Physician:  Drake Leach, PA-C [address] [place],  [place] [address]  Primary Physician:  Glori Luis, MD  History of Present Illness: 02/27/2022 [name] is here today with a chief complaint of ***right sided low back pain that radiates into the right thigh to her knee. Her leg pain is worse than the back pain?  Duration: ***4 weeks Location: *** Quality: *** Severity: *** 10/10 Precipitating: aggravated by ***walking, standing? Modifying factors: made better by *** Weakness: none Timing: ***intermittent? Bowel/Bladder Dysfunction: none  Conservative measures:  Physical therapy: *** has not participated in? Multimodal medical therapy including regular antiinflammatories: *** prednisone, flexeril, voltaren Injections: *** has epidural steroid injections?  Past Surgery: *** 03/07/21: L4-5 transforaminal  lumbar interbody fusion by Dr. Phylliss Blakes has ***no symptoms of cervical myelopathy.  The symptoms are causing a significant impact on the patient's life.   Review of Systems:  A 10 point review of systems is negative, except for the pertinent positives and negatives detailed in the HPI.  Past Medical History: Past Medical History:  Diagnosis Date   Arthritis    Chicken pox    Diabetes mellitus without complication ([facility])    Diabetic eye exam ([facility])    History of 2019 novel coronavirus disease (COVID-19) 01/13/2021   History of mammogram 08/16/2012   neg   History of Papanicolaou smear of cervix 06/21/2012   neg/neg   Hyperlipidemia    Hypertension    Hypothyroidism (acquired)    Obesity    Sleep apnea    Thyroid disease     Past Surgical History: Past Surgical History:  Procedure Laterality Date   ABDOMINAL HYSTERECTOMY  2012   Cervix & ovaries intact   APPENDECTOMY     CESAREAN SECTION     COLONOSCOPY WITH PROPOFOL N/A 11/05/2015   Procedure: COLONOSCOPY WITH PROPOFOL;  Surgeon: Christena Deem, MD;  Location[facility] ENDOSCOPY;  Service: Endoscopy;  Laterality: N/A;   MAXIMUM ACCESS (MAS) TRANSFORAMINAL LUMBAR INTERBODY FUSION (TLIF) 1 LEVEL N/A 03/07/2021   Procedure: L4-5 MAXIMUM ACCESS (MAS) TRANSFORAMINAL LUMBAR INTERBODY FUSION (TLIF) 1 LEVEL;  Surgeon: Lucy Chris, MD;  Location: [facility] ORS;  Service: Neurosurgery;  Laterality: N/A;    Allergies: Allergies as of 02/28/2022 - Review Complete 02/03/2022  Allergen Reaction Noted   Codeine Itching 07/21/2015    Medications: No outpatient medications have been marked as taking for the 02/28/22 encounter (Appointment) with Venetia Night, MD.    Social History: Social History   Tobacco Use   Smoking status: Every Day    Packs/day: 0.50    Years: 42.00    Total pack years: 21.00    Types: Cigarettes   Smokeless tobacco: Never   Tobacco comments:    1/2 pack per day  Vaping Use   Vaping Use: Never used  Substance Use Topics   Alcohol use: Yes    Alcohol/week: 0.0 - 1.0 standard drinks of alcohol    Comment: Rare usage    Drug use: No    Family Medical History: Family History  Problem Relation Age of Onset   Arthritis Mother    Hyperlipidemia Mother    Cervical cancer Mother 31       Malignant   Alcohol abuse Father    Hyperlipidemia Father    Heart disease Father    Hypertension Father    Stroke Maternal Grandmother    Diabetes Maternal Grandmother    Arthritis Maternal Grandfather  Diabetes Maternal Grandfather        DM Type 2   Asthma Paternal Grandmother    Heart disease Paternal Grandmother    Lung cancer Paternal Grandmother        Malignant   Prostate cancer Paternal Grandfather    Hyperlipidemia Paternal Grandfather    Hypertension Paternal Grandfather    Heart disease Paternal Grandfather    Lung cancer Paternal Grandfather    Breast cancer Neg Hx     Physical Examination: There were no vitals filed for this visit.  General: Patient is well developed, well nourished,  calm, collected, and in no apparent distress. Attention to examination is appropriate.  Neck:   Supple.  Full range of motion.  Respiratory: Patient is breathing without any difficulty.   NEUROLOGICAL:     Awake, alert, oriented to person, place, and time.  Speech is clear and fluent. Fund of knowledge is appropriate.   Cranial Nerves: Pupils equal round and reactive to light.  Facial tone is symmetric.  Facial sensation is symmetric. Shoulder shrug is symmetric. Tongue protrusion is midline.  There is no pronator drift.  ROM of spine: full.    Strength: Side Biceps Triceps Deltoid Interossei Grip Wrist Ext. Wrist Flex.  R 5 5 5 5 5 5 5   L 5 5 5 5 5 5 5    Side Iliopsoas Quads Hamstring PF DF EHL  R 5 5 5 5 5 5   L 5 5 5 5 5 5    Reflexes are ***2+ and symmetric at the biceps, triceps, brachioradialis, patella and achilles.   Hoffman's is absent.   Bilateral upper and lower extremity sensation is intact to light touch.    No evidence of dysmetria noted.  Gait is normal.     Medical Decision Making  Imaging: ***  I have personally reviewed the images and agree with the above interpretation.  Assessment and Plan: Ms. [name] is a pleasant 56 y.o. female with ***   I spent a total of *** minutes in face-to-face and non-face-to-face activities related to this patient's care today.  Thank you for involving me in the care of this patient.      Lirio Bach K. Myer Haff MD, [facility] Neurosurgery

## 2022-02-28 ENCOUNTER — Encounter: Payer: Self-pay | Admitting: Neurosurgery

## 2022-02-28 ENCOUNTER — Other Ambulatory Visit: Payer: Self-pay

## 2022-02-28 ENCOUNTER — Ambulatory Visit (INDEPENDENT_AMBULATORY_CARE_PROVIDER_SITE_OTHER): Payer: BC Managed Care – PPO | Admitting: Neurosurgery

## 2022-02-28 VITALS — BP 108/68 | Ht <= 58 in | Wt 141.0 lb

## 2022-02-28 DIAGNOSIS — Z01818 Encounter for other preprocedural examination: Secondary | ICD-10-CM

## 2022-02-28 DIAGNOSIS — R29898 Other symptoms and signs involving the musculoskeletal system: Secondary | ICD-10-CM | POA: Diagnosis not present

## 2022-02-28 DIAGNOSIS — M5416 Radiculopathy, lumbar region: Secondary | ICD-10-CM

## 2022-02-28 NOTE — Patient Instructions (Addendum)
Please see below for information in regards to your upcoming surgery:  Planned surgery: Right L2-3 microdiscectomy   Surgery date: 03/24/22 - you will find out your arrival time the business day before your surgery.   Mounjaro - hold for 7 days prior to surgery Jardiance - hold for 3 days prior to surgery Metformin - hold for 2 days prior to surgery   Surgical clearance: we will send a clearance request to Dr Caryl Bis   Pre-op appointment at [facility]: we will call you with a date/time for this. Pre-admit testing is located on the first floor of the [facility], [place], [address].   Pre-op labs may be done at your pre-op appointment. You are not required to fast for these labs.    Should you need to change your pre-op appointment, please call Pre-admit testing at [phone].   If you have FMLA/disability paperwork, please drop it off or fax it to [phone], attention [place].   If you have any questions/concerns before or after surgery, you can reach [place] at [phone], or you can send a mychart message. If you have a concern after hours that cannot wait until normal business hours, you can call [phone] or [phone] and ask the answering service to page the neurosurgeon on call.   Appointments/FMLA & disability paperwork: [place] Nurse: Ophelia Shoulder  Medical assistant: Raquel Sarna Physician Assistant's: Cooper Render & Geronimo Boot Surgeon: Meade Maw, MD

## 2022-03-07 ENCOUNTER — Telehealth: Payer: Self-pay

## 2022-03-07 NOTE — Telephone Encounter (Signed)
I called the patient and spoke with her and she stated she has had no SOB or chest pain and she also can walk up 2 flights of stairs and she can walk several blocks without SOB and chest pain.  I informed her that once the form was done I would fax it to the surgeons office and she understood. Almir Botts,cma

## 2022-03-07 NOTE — Telephone Encounter (Signed)
Patty states she is calling from [facility] to see if we received a surgical clearance form for patient.  Patty states the form was faxed to [place] on 02/28/2022.

## 2022-03-07 NOTE — Telephone Encounter (Signed)
I called the patient and phone kept ringing no VM.  Brenda Farmer,cma

## 2022-03-07 NOTE — Telephone Encounter (Signed)
Form was printed and put in sign basket.  Surgery date is 03/24/2022.Brenda Farmer,cma

## 2022-03-07 NOTE — Telephone Encounter (Signed)
Noted. Form reviewed. I have seen the patient 2 times in the past 6 months so I do not think she has to come in to see me for clearance unless she has developed new symptoms. Is she having any chest pain or shortness of breath? Can she climb 2 flights of stairs or walk several blocks without having chest pain or shortness of breath?

## 2022-03-08 NOTE — Telephone Encounter (Signed)
Clearance signed. Please let her know to monitor her glucose while she is off her diabetes medications and if her glucose trends up significantly she needs to contact us right away.

## 2022-03-08 NOTE — Telephone Encounter (Signed)
Called and spoke with the patient and I informed her that I faxed over the clearance form confirmation given and if her BS starts to trend up very high to call and let us know and she understood.  Tyaire Odem,cma

## 2022-03-09 ENCOUNTER — Telehealth: Payer: Self-pay

## 2022-03-09 DIAGNOSIS — E1165 Type 2 diabetes mellitus with hyperglycemia: Secondary | ICD-10-CM

## 2022-03-10 NOTE — Telephone Encounter (Signed)
She should have refills at the pharmacy already.

## 2022-03-10 NOTE — Telephone Encounter (Signed)
I called and spoke with the patient and the Brenda Farmer was on backorder and it came in yesterday so the patient stated she did not need to send the RX somewhere else to disregard.  [name],cma

## 2022-03-13 ENCOUNTER — Encounter
Admission: RE | Admit: 2022-03-13 | Discharge: 2022-03-13 | Disposition: A | Discharge status: Home / Self Care | Payer: BC Managed Care – PPO | Source: Ambulatory Visit | Attending: Neurosurgery | Admitting: Neurosurgery

## 2022-03-13 VITALS — BP 106/77 | HR 84 | Temp 98.1°F | Resp 16 | Ht <= 58 in | Wt 136.6 lb

## 2022-03-13 DIAGNOSIS — I10 Essential (primary) hypertension: Secondary | ICD-10-CM | POA: Diagnosis not present

## 2022-03-13 DIAGNOSIS — Z01818 Encounter for other preprocedural examination: Secondary | ICD-10-CM | POA: Insufficient documentation

## 2022-03-13 DIAGNOSIS — E1165 Type 2 diabetes mellitus with hyperglycemia: Secondary | ICD-10-CM | POA: Insufficient documentation

## 2022-03-13 DIAGNOSIS — Z01812 Encounter for preprocedural laboratory examination: Secondary | ICD-10-CM

## 2022-03-13 LAB — URINALYSIS, ROUTINE W REFLEX MICROSCOPIC
Bacteria, UA: NONE SEEN
Bilirubin Urine: NEGATIVE
Glucose, UA: >=500 mg/dL — AB
Ketones, ur: NEGATIVE mg/dL
Leukocytes,Ua: NEGATIVE
Nitrite: NEGATIVE
Protein, ur: NEGATIVE mg/dL
Specific Gravity, Urine: 1.02 (ref 1.005–1.030)
Squamous Epithelial / HPF: NONE SEEN (ref 0–5)
pH: 5 (ref 5.0–8.0)

## 2022-03-13 LAB — CBC
HCT: 43 % (ref 36.0–46.0)
Hemoglobin: 14.5 g/dL (ref 12.0–15.0)
MCH: 30.3 pg (ref 26.0–34.0)
MCHC: 33.7 g/dL (ref 30.0–36.0)
MCV: 90 fL (ref 80.0–100.0)
Platelets: 272 10*3/uL (ref 150–400)
RBC: 4.78 MIL/uL (ref 3.87–5.11)
RDW: 14.4 % (ref 11.5–15.5)
WBC: 7.2 10*3/uL (ref 4.0–10.5)
nRBC: 0 % (ref 0.0–0.2)

## 2022-03-13 LAB — BASIC METABOLIC PANEL
Anion gap: 11 (ref 5–15)
BUN: 27 mg/dL — ABNORMAL HIGH (ref 6–20)
CO2: 24 mmol/L (ref 22–32)
Calcium: 9.6 mg/dL (ref 8.9–10.3)
Chloride: 102 mmol/L (ref 98–111)
Creatinine, Ser: 0.7 mg/dL (ref 0.44–1.00)
GFR, Estimated: >60 mL/min (ref >60)
Glucose, Bld: 104 mg/dL — ABNORMAL HIGH (ref 70–99)
Potassium: 3.6 mmol/L (ref 3.5–5.1)
Sodium: 137 mmol/L (ref 135–145)

## 2022-03-13 LAB — SURGICAL PCR SCREEN
MRSA, PCR: NEGATIVE
Staphylococcus aureus: NEGATIVE

## 2022-03-13 LAB — TYPE AND SCREEN
ABO/RH(D): O POS
Antibody Screen: NEGATIVE

## 2022-03-13 NOTE — Patient Instructions (Addendum)
Your procedure is scheduled on: Friday March 24, 2022. Report to Day Surgery inside [facility] 2nd floor, stop by registration desk before getting on elevator.  To find out your arrival time please call [phone] between 1PM - 3PM on Thursday March 23, 2022.  Remember: Instructions that are not followed completely may result in serious medical risk,  up to and including death, or upon the discretion of your surgeon and anesthesiologist your  surgery may need to be rescheduled.     _X__ 1. Do not eat food after midnight the night before your procedure.                 No chewing gum or hard candies. You may drink clear liquids up to 2 hours                 before you are scheduled to arrive for your surgery- DO not drink clear                 liquids within 2 hours of the start of your surgery.                 Clear Liquids include:  water, apple juice without pulp, Black Coffee or Tea (Do not add                 anything to coffee or tea).  __X__2.  On the morning of surgery brush your teeth with toothpaste and water, you                may rinse your mouth with mouthwash if you wish.  Do not swallow any toothpaste or mouthwash.     _X__ 3.  No Alcohol for 24 hours before or after surgery.   _X__ 4.  Do Not Smoke or use e-cigarettes For 24 Hours Prior to Your Surgery.                 Do not use any chewable tobacco products for at least 6 hours prior to                 Surgery.  _X__  5.  Do not use any recreational drugs (marijuana, cocaine, heroin, ecstasy, MDMA or other)                For at least one week prior to your surgery.  Combination of these drugs with anesthesia                May have life threatening results.  ____  6.  Bring all medications with you on the day of surgery if instructed.   __X__  7.  Notify your doctor if there is any change in your medical condition      (cold, fever, infections).     Do not wear jewelry, make-up,  hairpins, clips or nail polish. Do not wear lotions, powders, or perfumes. You may wear deodorant. Do not shave 48 hours prior to surgery. Men may shave face and neck. Do not bring valuables to the hospital.    [facility] is not responsible for any belongings or valuables.  Contacts, dentures or bridgework may not be worn into surgery. Leave your suitcase in the car. After surgery it may be brought to your room. For patients admitted to the hospital, discharge time is determined by your treatment team.   Patients discharged the day of surgery will not be allowed to drive home.   Make arrangements for  someone to be with you for the first 24 hours of your Same Day Discharge.  __X__ Take these medicines the morning of surgery with A SIP OF WATER:    1. escitalopram (LEXAPRO) 10 MG   2. ezetimibe (ZETIA) 10 MG  3. levothyroxine (SYNTHROID) 125 MCG  4.  5.  6.  ____ Fleet Enema (as directed)   __X__ Use CHG Soap (or wipes) as directed  ____ Use Benzoyl Peroxide Gel as instructed  __X__ Use inhalers on the day of surgery  albuterol (VENTOLIN HFA) 108 (90 Base) MCG/ACT inhaler  __X__ Stop metFORMIN (GLUCOPHAGE) 500 MG 2 days prior to surgery (take last dose 03/21/22)    __X__ Stop JARDIANCE 25 MG 3 days before your surgery. (Take last dose 03/20/22)          of surgery.   __X_ Skip tirzepatide [facility]) 10 MG/0.5ML Pen dose 1 week before your surgery  ____ Call your PCP, cardiologist, or Pulmonologist if taking Coumadin/Plavix/aspirin and ask when to stop before your surgery.   __X__ One Week prior to surgery- Stop Anti-inflammatories such as Ibuprofen, Aleve, Advil, Motrin, meloxicam (MOBIC), diclofenac, etodolac, ketorolac, Toradol, Daypro, piroxicam, Goody's or BC powders. OK TO USE TYLENOL IF NEEDED   __X__ One week prior to surgery stop all vitamins and or supplements until after surgery.    ____ Bring C-Pap to the hospital.    If you have any questions regarding  your pre-procedure instructions,  Please call Pre-admit Testing at [phone]

## 2022-03-23 NOTE — Anesthesia Preprocedure Evaluation (Addendum)
Anesthesia Evaluation  Patient identified by MRN, date of birth, ID band Patient awake    Reviewed: Allergy & Precautions, NPO status , Patient's Chart, lab work & pertinent test results  History of Anesthesia Complications Negative for: history of anesthetic complications  Airway Mallampati: IV   Neck ROM: Full    Dental no notable dental hx.  Missing few molars:   Pulmonary sleep apnea , COPD, Current Smoker and Patient abstained from smoking. COVID 12/2020, asymptomatic   Pulmonary exam normal breath sounds clear to auscultation       Cardiovascular hypertension, Normal cardiovascular exam Rhythm:Regular Rate:Normal  ECG 01/11/21: normal   Neuro/Psych  PSYCHIATRIC DISORDERS  Depression    Lumbar radiculitis  Neuromuscular disease    GI/Hepatic negative GI ROS,,,NASH   Endo/Other  diabetes, Type 2Hypothyroidism  Obesity   Renal/GU negative Renal ROS     Musculoskeletal  (+) Arthritis ,    Abdominal Normal abdominal exam  (+)   Peds  Hematology negative hematology ROS (+)   Anesthesia Other Findings   Reproductive/Obstetrics                             Anesthesia Physical Anesthesia Plan  ASA: 3  Anesthesia Plan: General   Post-op Pain Management: Gabapentin PO (pre-op)*, Tylenol PO (pre-op)* and Toradol IV (intra-op)*   Induction: Intravenous  PONV Risk Score and Plan: 2 and Ondansetron, Dexamethasone, TIVA and Propofol infusion  Airway Management Planned: Oral ETT  Additional Equipment:   Intra-op Plan:   Post-operative Plan: Extubation in OR  Informed Consent: I have reviewed the patients History and Physical, chart, labs and discussed the procedure including the risks, benefits and alternatives for the proposed anesthesia with the patient or authorized representative who has indicated his/her understanding and acceptance.     Dental advisory given  Plan Discussed  with: CRNA  Anesthesia Plan Comments:         Anesthesia Quick Evaluation

## 2022-03-24 ENCOUNTER — Encounter: Payer: Self-pay | Admitting: Neurosurgery

## 2022-03-24 ENCOUNTER — Other Ambulatory Visit: Payer: Self-pay

## 2022-03-24 ENCOUNTER — Ambulatory Visit: Payer: BC Managed Care – PPO | Admitting: Urgent Care

## 2022-03-24 ENCOUNTER — Ambulatory Visit: Payer: BC Managed Care – PPO | Admitting: Anesthesiology

## 2022-03-24 ENCOUNTER — Encounter
Admission: RE | Disposition: A | Discharge status: Home / Self Care | Payer: Self-pay | Source: Ambulatory Visit | Attending: Neurosurgery

## 2022-03-24 ENCOUNTER — Ambulatory Visit: Payer: BC Managed Care – PPO

## 2022-03-24 ENCOUNTER — Ambulatory Visit
Admission: RE | Admit: 2022-03-24 | Discharge: 2022-03-24 | Disposition: A | Discharge status: Home / Self Care | Payer: BC Managed Care – PPO | Source: Ambulatory Visit | Attending: Neurosurgery | Admitting: Neurosurgery

## 2022-03-24 DIAGNOSIS — Z01818 Encounter for other preprocedural examination: Secondary | ICD-10-CM

## 2022-03-24 DIAGNOSIS — M5416 Radiculopathy, lumbar region: Secondary | ICD-10-CM

## 2022-03-24 DIAGNOSIS — R531 Weakness: Secondary | ICD-10-CM | POA: Diagnosis not present

## 2022-03-24 DIAGNOSIS — M5116 Intervertebral disc disorders with radiculopathy, lumbar region: Secondary | ICD-10-CM | POA: Diagnosis present

## 2022-03-24 DIAGNOSIS — E1165 Type 2 diabetes mellitus with hyperglycemia: Secondary | ICD-10-CM

## 2022-03-24 DIAGNOSIS — R29898 Other symptoms and signs involving the musculoskeletal system: Secondary | ICD-10-CM

## 2022-03-24 HISTORY — PX: LUMBAR LAMINECTOMY/DECOMPRESSION MICRODISCECTOMY: SHX5026

## 2022-03-24 HISTORY — DX: Other symptoms and signs involving the musculoskeletal system: R29.898

## 2022-03-24 LAB — GLUCOSE, CAPILLARY: Glucose-Capillary: 168 mg/dL — ABNORMAL HIGH (ref 70–99)

## 2022-03-24 SURGERY — LUMBAR LAMINECTOMY/DECOMPRESSION MICRODISCECTOMY 1 LEVEL
Anesthesia: General | Site: Spine Lumbar | Laterality: Right

## 2022-03-24 MED ORDER — PROPOFOL 1000 MG/100ML IV EMUL
INTRAVENOUS | Status: AC
  Filled 2022-03-24: qty 100

## 2022-03-24 MED ORDER — PROPOFOL 500 MG/50ML IV EMUL
INTRAVENOUS | Status: DC | PRN
  Administered 2022-03-24: 200 ug/kg/min via INTRAVENOUS

## 2022-03-24 MED ORDER — LIDOCAINE HCL (CARDIAC) PF 100 MG/5ML IV SOSY
PREFILLED_SYRINGE | INTRAVENOUS | Status: DC | PRN
  Administered 2022-03-24: 100 mg via INTRAVENOUS

## 2022-03-24 MED ORDER — ACETAMINOPHEN 10 MG/ML IV SOLN: 1000.0000 mg | Freq: Once | INTRAVENOUS | Status: DC | PRN

## 2022-03-24 MED ORDER — MIDAZOLAM HCL 2 MG/2ML IJ SOLN
INTRAMUSCULAR | Status: AC
  Filled 2022-03-24: qty 2

## 2022-03-24 MED ORDER — PROPOFOL 10 MG/ML IV BOLUS
INTRAVENOUS | Status: AC
  Filled 2022-03-24: qty 20

## 2022-03-24 MED ORDER — CHLORHEXIDINE GLUCONATE 0.12 % MT SOLN: 15.0000 mL | Freq: Once | OROMUCOSAL | Status: AC

## 2022-03-24 MED ORDER — PROPOFOL 10 MG/ML IV BOLUS
INTRAVENOUS | Status: DC | PRN
  Administered 2022-03-24: 150 mg via INTRAVENOUS

## 2022-03-24 MED ORDER — MIDAZOLAM HCL 2 MG/2ML IJ SOLN
INTRAMUSCULAR | Status: DC | PRN
  Administered 2022-03-24: 2 mg via INTRAVENOUS

## 2022-03-24 MED ORDER — OXYCODONE HCL 5 MG PO TABS
5.0000 mg | ORAL_TABLET | Freq: Once | ORAL | Status: AC | PRN
  Administered 2022-03-24: 5 mg via ORAL

## 2022-03-24 MED ORDER — ORAL CARE MOUTH RINSE: 15.0000 mL | Freq: Once | OROMUCOSAL | Status: AC

## 2022-03-24 MED ORDER — PROMETHAZINE HCL 25 MG/ML IJ SOLN: 6.2500 mg | INTRAMUSCULAR | Status: DC | PRN

## 2022-03-24 MED ORDER — REMIFENTANIL HCL 1 MG IV SOLR
INTRAVENOUS | Status: AC
  Filled 2022-03-24: qty 1000

## 2022-03-24 MED ORDER — BUPIVACAINE-EPINEPHRINE (PF) 0.5% -1:200000 IJ SOLN
INTRAMUSCULAR | Status: DC | PRN
  Administered 2022-03-24: 4 mL

## 2022-03-24 MED ORDER — SODIUM CHLORIDE (PF) 0.9 % IJ SOLN
INTRAMUSCULAR | Status: DC | PRN
  Administered 2022-03-24: 60 mL via INTRAMUSCULAR

## 2022-03-24 MED ORDER — FENTANYL CITRATE (PF) 100 MCG/2ML IJ SOLN
INTRAMUSCULAR | Status: DC | PRN
  Administered 2022-03-24 (×2): 50 ug via INTRAVENOUS

## 2022-03-24 MED ORDER — BUPIVACAINE-EPINEPHRINE (PF) 0.5% -1:200000 IJ SOLN
INTRAMUSCULAR | Status: AC
  Filled 2022-03-24: qty 90

## 2022-03-24 MED ORDER — 0.9 % SODIUM CHLORIDE (POUR BTL) OPTIME
TOPICAL | Status: DC | PRN
  Administered 2022-03-24: 500 mL

## 2022-03-24 MED ORDER — REMIFENTANIL HCL 1 MG IV SOLR
INTRAVENOUS | Status: DC | PRN
  Administered 2022-03-24: .5 ug/kg/min via INTRAVENOUS

## 2022-03-24 MED ORDER — PHENYLEPHRINE HCL-NACL 20-0.9 MG/250ML-% IV SOLN
INTRAVENOUS | Status: DC | PRN
  Administered 2022-03-24: 50 ug/min via INTRAVENOUS

## 2022-03-24 MED ORDER — SODIUM CHLORIDE 0.9 % IV SOLN: INTRAVENOUS | Status: DC

## 2022-03-24 MED ORDER — METHOCARBAMOL 500 MG PO TABS: 500.0000 mg | ORAL_TABLET | Freq: Once | ORAL | Status: DC

## 2022-03-24 MED ORDER — PHENYLEPHRINE HCL (PRESSORS) 10 MG/ML IV SOLN
INTRAVENOUS | Status: AC
  Filled 2022-03-24: qty 1

## 2022-03-24 MED ORDER — BUPIVACAINE HCL (PF) 0.5 % IJ SOLN
INTRAMUSCULAR | Status: AC
  Filled 2022-03-24: qty 90

## 2022-03-24 MED ORDER — DEXAMETHASONE SODIUM PHOSPHATE 10 MG/ML IJ SOLN
INTRAMUSCULAR | Status: AC
  Filled 2022-03-24: qty 1

## 2022-03-24 MED ORDER — DEXAMETHASONE SODIUM PHOSPHATE 10 MG/ML IJ SOLN
INTRAMUSCULAR | Status: DC | PRN
  Administered 2022-03-24: 10 mg via INTRAVENOUS

## 2022-03-24 MED ORDER — ONDANSETRON HCL 4 MG/2ML IJ SOLN
INTRAMUSCULAR | Status: AC
  Filled 2022-03-24: qty 2

## 2022-03-24 MED ORDER — CHLORHEXIDINE GLUCONATE 0.12 % MT SOLN
OROMUCOSAL | Status: AC
  Administered 2022-03-24: 15 mL via OROMUCOSAL
  Filled 2022-03-24: qty 15

## 2022-03-24 MED ORDER — GABAPENTIN 600 MG PO TABS
300.0000 mg | ORAL_TABLET | Freq: Once | ORAL | Status: DC
  Filled 2022-03-24: qty 0.5

## 2022-03-24 MED ORDER — ONDANSETRON HCL 4 MG/2ML IJ SOLN
INTRAMUSCULAR | Status: DC | PRN
  Administered 2022-03-24: 4 mg via INTRAVENOUS

## 2022-03-24 MED ORDER — FENTANYL CITRATE (PF) 100 MCG/2ML IJ SOLN
INTRAMUSCULAR | Status: AC
  Filled 2022-03-24: qty 2

## 2022-03-24 MED ORDER — SUCCINYLCHOLINE CHLORIDE 200 MG/10ML IV SOSY
PREFILLED_SYRINGE | INTRAVENOUS | Status: DC | PRN
  Administered 2022-03-24: 100 mg via INTRAVENOUS

## 2022-03-24 MED ORDER — FAMOTIDINE 20 MG PO TABS: 20.0000 mg | ORAL_TABLET | Freq: Once | ORAL | Status: AC

## 2022-03-24 MED ORDER — METHOCARBAMOL 500 MG PO TABS: 500.0000 mg | ORAL_TABLET | Freq: Four times a day (QID) | ORAL | 0 refills | Status: DC | PRN

## 2022-03-24 MED ORDER — BUPIVACAINE LIPOSOME 1.3 % IJ SUSP
INTRAMUSCULAR | Status: AC
  Filled 2022-03-24: qty 60

## 2022-03-24 MED ORDER — ACETAMINOPHEN 500 MG PO TABS
ORAL_TABLET | ORAL | Status: AC
  Administered 2022-03-24: 1000 mg via ORAL
  Filled 2022-03-24: qty 2

## 2022-03-24 MED ORDER — CEFAZOLIN SODIUM-DEXTROSE 2-4 GM/100ML-% IV SOLN
2.0000 g | Freq: Once | INTRAVENOUS | Status: AC
  Administered 2022-03-24: 2 g via INTRAVENOUS

## 2022-03-24 MED ORDER — ACETAMINOPHEN 500 MG PO TABS: 1000.0000 mg | ORAL_TABLET | Freq: Once | ORAL | Status: AC

## 2022-03-24 MED ORDER — OXYCODONE HCL 5 MG PO TABS: 5.0000 mg | ORAL_TABLET | Freq: Three times a day (TID) | ORAL | 0 refills | Status: DC | PRN

## 2022-03-24 MED ORDER — OXYCODONE HCL 5 MG/5ML PO SOLN: 5.0000 mg | Freq: Once | ORAL | Status: AC | PRN

## 2022-03-24 MED ORDER — SURGIFLO WITH THROMBIN (HEMOSTATIC MATRIX KIT) OPTIME
TOPICAL | Status: DC | PRN
  Administered 2022-03-24: 1 via TOPICAL

## 2022-03-24 MED ORDER — METHOCARBAMOL 500 MG PO TABS
ORAL_TABLET | ORAL | Status: AC
  Filled 2022-03-24: qty 1

## 2022-03-24 MED ORDER — FENTANYL CITRATE (PF) 100 MCG/2ML IJ SOLN: 25.0000 ug | INTRAMUSCULAR | Status: DC | PRN

## 2022-03-24 MED ORDER — CEFAZOLIN SODIUM-DEXTROSE 2-4 GM/100ML-% IV SOLN
INTRAVENOUS | Status: AC
  Filled 2022-03-24: qty 100

## 2022-03-24 MED ORDER — LIDOCAINE HCL (PF) 2 % IJ SOLN
INTRAMUSCULAR | Status: AC
  Filled 2022-03-24: qty 5

## 2022-03-24 MED ORDER — FAMOTIDINE 20 MG PO TABS
ORAL_TABLET | ORAL | Status: AC
  Administered 2022-03-24: 20 mg via ORAL
  Filled 2022-03-24: qty 1

## 2022-03-24 MED ORDER — GABAPENTIN 300 MG PO CAPS: 300.0000 mg | ORAL_CAPSULE | Freq: Once | ORAL | Status: AC

## 2022-03-24 MED ORDER — GABAPENTIN 300 MG PO CAPS
ORAL_CAPSULE | ORAL | Status: AC
  Administered 2022-03-24: 300 mg via ORAL
  Filled 2022-03-24: qty 1

## 2022-03-24 MED ORDER — OXYCODONE HCL 5 MG PO TABS
ORAL_TABLET | ORAL | Status: AC
  Filled 2022-03-24: qty 1

## 2022-03-24 MED ORDER — SODIUM CHLORIDE FLUSH 0.9 % IV SOLN
INTRAVENOUS | Status: AC
  Filled 2022-03-24: qty 60

## 2022-03-24 MED ORDER — DROPERIDOL 2.5 MG/ML IJ SOLN: 0.6250 mg | Freq: Once | INTRAMUSCULAR | Status: DC | PRN

## 2022-03-24 MED ORDER — METHYLPREDNISOLONE ACETATE 40 MG/ML IJ SUSP
INTRAMUSCULAR | Status: AC
  Filled 2022-03-24: qty 3

## 2022-03-24 SURGICAL SUPPLY — 49 items
ADH SKN CLS APL DERMABOND .7 (GAUZE/BANDAGES/DRESSINGS) ×1
ADH SKN CLS LQ APL DERMABOND (GAUZE/BANDAGES/DRESSINGS) ×1
AGENT HMST KT MTR STRL THRMB (HEMOSTASIS) ×1
BASIN KIT SINGLE STR (MISCELLANEOUS) ×1 IMPLANT
BUR NEURO DRILL SOFT 3.0X3.8M (BURR) ×1 IMPLANT
CNTNR SPEC 2.5X3XGRAD LEK (MISCELLANEOUS) ×1
CONT SPEC 4OZ STER OR WHT (MISCELLANEOUS) ×1
CONT SPEC 4OZ STRL OR WHT (MISCELLANEOUS) ×1
CONTAINER SPEC 2.5X3XGRAD LEK (MISCELLANEOUS) ×1 IMPLANT
DERMABOND ADVANCED .7 DNX12 (GAUZE/BANDAGES/DRESSINGS) ×1 IMPLANT
DERMABOND ADVANCED .7 DNX6 (GAUZE/BANDAGES/DRESSINGS) IMPLANT
DRAPE C ARM PK CFD 31 SPINE (DRAPES) ×1 IMPLANT
DRAPE LAPAROTOMY 100X77 ABD (DRAPES) ×1 IMPLANT
DRAPE MICROSCOPE SPINE 48X150 (DRAPES) ×1 IMPLANT
DRAPE SURG 17X11 SM STRL (DRAPES) ×1 IMPLANT
DRSG OPSITE POSTOP 3X4 (GAUZE/BANDAGES/DRESSINGS) IMPLANT
ELECT EZSTD 165MM 6.5IN (MISCELLANEOUS) ×1
ELECT REM PT RETURN 9FT ADLT (ELECTROSURGICAL) ×1
ELECTRODE EZSTD 165MM 6.5IN (MISCELLANEOUS) IMPLANT
ELECTRODE REM PT RTRN 9FT ADLT (ELECTROSURGICAL) ×1 IMPLANT
GLOVE BIOGEL PI IND STRL 6.5 (GLOVE) ×1 IMPLANT
GLOVE BIOGEL PI IND STRL 8.5 (GLOVE) ×1 IMPLANT
GLOVE SURG SYN 6.5 ES PF (GLOVE) ×2 IMPLANT
GLOVE SURG SYN 6.5 PF PI (GLOVE) ×2 IMPLANT
GLOVE SURG SYN 8.5  E (GLOVE) ×3
GLOVE SURG SYN 8.5 E (GLOVE) ×3 IMPLANT
GLOVE SURG SYN 8.5 PF PI (GLOVE) ×3 IMPLANT
GOWN SRG LRG LVL 4 IMPRV REINF (GOWNS) ×1 IMPLANT
GOWN SRG XL LVL 3 NONREINFORCE (GOWNS) ×1 IMPLANT
GOWN STRL NON-REIN TWL XL LVL3 (GOWNS) ×1
GOWN STRL REIN LRG LVL4 (GOWNS) ×1
GRAFT DURAGEN MATRIX 1WX1L (Tissue) IMPLANT
KIT SPINAL PRONEVIEW (KITS) ×1 IMPLANT
MANIFOLD NEPTUNE II (INSTRUMENTS) ×1 IMPLANT
MARKER SKIN DUAL TIP RULER LAB (MISCELLANEOUS) ×1 IMPLANT
NDL SAFETY ECLIP 18X1.5 (MISCELLANEOUS) ×1 IMPLANT
NS IRRIG 500ML POUR BTL (IV SOLUTION) IMPLANT
PACK LAMINECTOMY NEURO (CUSTOM PROCEDURE TRAY) ×1 IMPLANT
PAD ARMBOARD 7.5X6 YLW CONV (MISCELLANEOUS) ×1 IMPLANT
SURGIFLO W/THROMBIN 8M KIT (HEMOSTASIS) ×1 IMPLANT
SUT V-LOC 90 ABS DVC 3-0 CL (SUTURE) IMPLANT
SUT VIC AB 0 CT1 27 (SUTURE) ×2
SUT VIC AB 0 CT1 27XCR 8 STRN (SUTURE) ×1 IMPLANT
SUT VIC AB 2-0 CT1 18 (SUTURE) ×1 IMPLANT
SYR 10ML LL (SYRINGE) ×2 IMPLANT
SYR 30ML LL (SYRINGE) ×2 IMPLANT
SYR 3ML LL SCALE MARK (SYRINGE) ×1 IMPLANT
TRAP FLUID SMOKE EVACUATOR (MISCELLANEOUS) ×1 IMPLANT
WATER STERILE IRR 1000ML POUR (IV SOLUTION) ×2 IMPLANT

## 2022-03-24 NOTE — Anesthesia Postprocedure Evaluation (Signed)
Anesthesia Post Note  Patient: Brenda Farmer  Procedure(s) Performed: RIGHT L2-3 MICRODISCECTOMY (Right: Spine Lumbar)  Patient location during evaluation: PACU Anesthesia Type: General Level of consciousness: awake and alert Pain management: pain level controlled Vital Signs Assessment: post-procedure vital signs reviewed and stable Respiratory status: spontaneous breathing, nonlabored ventilation and respiratory function stable Cardiovascular status: blood pressure returned to baseline and stable Postop Assessment: no apparent nausea or vomiting Anesthetic complications: no   No notable events documented.   Last Vitals:  Vitals:   03/24/22 1009 03/24/22 1035  BP: 130/73 105/64  Pulse: 90 82  Resp: 18 16  Temp: (!) 36.3 C   SpO2: 93% 95%    Last Pain:  Vitals:   03/24/22 1035  TempSrc:   PainSc: 3                  [place] Ouch

## 2022-03-24 NOTE — Discharge Summary (Signed)
Physician Discharge Summary  Patient ID: SHARVI MOONEYHAN MRN: [id number] DOB/AGE: [id number] y.o.  Admit date: 03/24/2022 Discharge date: 03/24/2022  Admission Diagnoses: lumbar radiculopathy, right leg weakness  Discharge Diagnoses:  Active Problems:   Right leg weakness   Discharged Condition: good  Hospital Course: Ms. Vollman was admitted for surgical intervention.  She underwent discectomy which was uneventful.  She met criteria for discharge.  Consults: None  Significant Diagnostic Studies: radiology: X-Ray: localization  Treatments: surgery: R L2-3 discectomy  Discharge Exam: Blood pressure 109/65, pulse 76, temperature (!) 97 F (36.1 C), temperature source Oral, resp. rate 20, height _0  (1.473 m), weight 62.1 kg, SpO2 96 %. General appearance: alert and cooperative CNI MAEW  Disposition: Discharge disposition: 01-Home or Self Care       Discharge Instructions     Discharge patient   Complete by: As directed    Discharge disposition: 01-Home or Self Care   Discharge patient date: 03/24/2022      Allergies as of 03/24/2022       Reactions   Codeine Itching        Medication List     TAKE these medications    albuterol 108 (90 Base) MCG/ACT inhaler Commonly known as: VENTOLIN HFA Inhale 1 puff into the lungs every 6 (six) hours as needed for wheezing or shortness of breath.   diclofenac 75 MG EC tablet Commonly known as: VOLTAREN TAKE 1 TABLET TWICE A DAY AS NEEDED FOR MILD PAIN   escitalopram 10 MG tablet Commonly known as: LEXAPRO TAKE 1 TABLET DAILY   ezetimibe 10 MG tablet Commonly known as: ZETIA TAKE 1 TABLET DAILY   gabapentin 300 MG capsule Commonly known as: NEURONTIN 1 po q hs x 3 days, then can increase to 2 po q hs if needed.   glucose blood test strip Commonly known as: Contour Test Use as instructed   hydrochlorothiazide 25 MG tablet Commonly known as: HYDRODIURIL TAKE 1 TABLET DAILY   Jardiance 25 MG  Tabs tablet Generic drug: empagliflozin TAKE 1 TABLET DAILY   levothyroxine 125 MCG tablet Commonly known as: SYNTHROID TAKE 1 TABLET DAILY   lisinopril 20 MG tablet Commonly known as: ZESTRIL TAKE 1 TABLET DAILY   metFORMIN 500 MG tablet Commonly known as: GLUCOPHAGE TAKE 1 TABLET TWICE A DAY WITH MEALS   methocarbamol 500 MG tablet Commonly known as: ROBAXIN Take 1 tablet (500 mg total) by mouth every 6 (six) hours as needed for muscle spasms.   multivitamin tablet Take 1 tablet by mouth daily.   oxyCODONE 5 MG immediate release tablet Commonly known as: Roxicodone Take 1 tablet (5 mg total) by mouth every 8 (eight) hours as needed.   rosuvastatin 40 MG tablet Commonly known as: CRESTOR TAKE 1 TABLET DAILY   tirzepatide 10 MG/0.5ML Pen Commonly known as: MOUNJARO Inject 10 mg into the skin once a week.        Follow-up Information     Geronimo Boot, PA-C Follow up on 04/07/2022.   Specialty: Neurosurgery Why: 130 pm Contact information: [address] [place] [address] [phone]                 Signed: Meade Maw 03/24/2022, 9:02 AM

## 2022-03-24 NOTE — Op Note (Signed)
Indications: [name] is suffering from lumbar radiculopathy. The patient tried and failed conservative management, prompting surgical intervention. She also had right leg weakness.  Findings: large disc herniation L2-3  Preoperative Diagnosis: Lumbar radiculopathy (ICD-10 M54.16), Right leg weakness Postoperative Diagnosis: same   EBL: 10 ml IVF: see AR ml Drains: none Disposition: Extubated and Stable to PACU Complications: none  No foley catheter was placed.   Preoperative Note:   Risks of surgery discussed include: infection, bleeding, stroke, coma, death, paralysis, CSF leak, nerve/spinal cord injury, numbness, tingling, weakness, complex regional pain syndrome, recurrent stenosis and/or disc herniation, vascular injury, development of instability, neck/back pain, need for further surgery, persistent symptoms, development of deformity, and the risks of anesthesia. The patient understood these risks and agreed to proceed.  Operative Note:   1) Right L2/3 microdiscectomy  The patient was then brought from the preoperative center with intravenous access established.  The patient underwent general anesthesia and endotracheal tube intubation, and was then rotated on the [place] rail top where all pressure points were appropriately padded.  The skin was then thoroughly cleansed.  Perioperative antibiotic prophylaxis was administered.  Sterile prep and drapes were then applied and a timeout was then observed.  C-arm was brought into the field under sterile conditions, and the L2-3 disc space identified and marked with an incision on the right 1cm lateral to midline.  Once this was complete a 2 cm incision was opened with the use of a #10 blade knife.  The Metrx tubes were sequentially advanced under lateral fluoroscopy until a 18 x 40 mm Metrx tube was placed over the facet and lamina and secured to the bed.    The microscope was then sterilely brought into the field and  muscle creep was hemostased with a bipolar and resected with a pituitary rongeur.  A Bovie extender was then used to expose the spinous process and lamina.  Careful attention was placed to not violate the facet capsule. A 3 mm matchstick drill bit was then used to make a hemi-laminotomy trough until the ligamentum flavum was exposed.  This was extended to the base of the spinous process.  Once this was complete and the underlying ligamentum flavum was visualized, the ligamentum was dissected with an up angle curette and resected with a #2 and #3 mm biting Kerr[place].  The laminotomy opening was also expanded in similar fashion and hemostasis was obtained with Surgifoam and a patty as well as bone wax.  The opening was extended superiorly. Once the underlying dura was visualized a [place]field 4 was then used to dissect and expose the traversing nerve root.  Once this was identified a nerve root retractor suction was used to mobilize this medially.  The venous plexus was hemostased with Surgifoam and light bipolar use.  A small penfied was then used to make a small annulotomy within the disc space and disc space contents were noted to come through the annulus.    The disc herniation was identified and dissected free using a balltip probe. The pituitary rongeur was used to remove the extruded disc fragments. Once the thecal sac and nerve root were noted to be relaxed and under less tension the ball-tipped feeler was passed along the foramen distally to ensure no residual compression was noted.    Depo-Medrol was placed along the nerve root.  The area was irrigated. The tube system was then removed under microscopic visualization and hemostasis was obtained with a bipolar.    The fascial layer  was reapproximated with the use of a 0- Vicryl suture.  Subcutaneous tissue layer was reapproximated using 2-0 Vicryl suture.  3-0 monocryl was used on the skin. The skin was then cleansed and Dermabond was used to close the skin  opening.  Patient was then rotated back to the preoperative bed awakened from anesthesia and taken to recovery all counts are correct in this case.   I performed the entire procedure with the assistance of Brenda Farmer RNFA as an Environmental consultant. An assistant was required for this procedure due to the complexity.  The assistant provided assistance in tissue manipulation and suction, and was required for the successful and safe performance of the procedure. I performed the critical portions of the procedure.   Meade Maw MD

## 2022-03-24 NOTE — Transfer of Care (Signed)
Immediate Anesthesia Transfer of Care Note  Patient: Brenda Farmer  Procedure(s) Performed: RIGHT L2-3 MICRODISCECTOMY (Right: Spine Lumbar)  Patient Location: PACU  Anesthesia Type:General  Level of Consciousness: alert  and drowsy  Airway & Oxygen Therapy: Patient Spontanous Breathing and Patient connected to face mask oxygen  Post-op Assessment: Report given to RN and Post -op Vital signs reviewed and stable  Post vital signs: stable  Last Vitals:  Vitals Value Taken Time  BP 146/68 03/24/22 0908  Temp    Pulse 95 03/24/22 0911  Resp 14 03/24/22 0911  SpO2 98 % 03/24/22 0911  Vitals shown include unvalidated device data.  Last Pain:  Vitals:   03/24/22 9244  TempSrc: Oral         Complications: No notable events documented.

## 2022-03-24 NOTE — Interval H&P Note (Signed)
History and Physical Interval Note:  03/24/2022 6:58 AM  Brenda Farmer  has presented today for surgery, with the diagnosis of Lumbar radiculitis  M54.16  Right leg weakness R29.898.  The various methods of treatment have been discussed with the patient and family. After consideration of risks, benefits and other options for treatment, the patient has consented to  Procedure(s): RIGHT L2-3 MICRODISCECTOMY (Right) as a surgical intervention.  The patient's history has been reviewed, patient examined, no change in status, stable for surgery.  I have reviewed the patient's chart and labs.  Questions were answered to the patient's satisfaction.    Heart sounds normal no MRG. Chest Clear to Auscultation Bilaterally.   Kathya Wilz

## 2022-03-24 NOTE — Discharge Instructions (Addendum)
Your surgeon has performed an operation on your lumbar spine (low back) to relieve pressure on one or more nerves. Many times, patients feel better immediately after surgery and can "overdo it." Even if you feel well, it is important that you follow these activity guidelines. If you do not let your back heal properly from the surgery, you can increase the chance of a disc herniation and/or return of your symptoms. The following are instructions to help in your recovery once you have been discharged from the hospital.  * It is ok to take NSAIDs after surgery.  Activity    No bending, lifting, or twisting ("BLT"). Avoid lifting objects heavier than 10 pounds (gallon milk jug).  Where possible, avoid household activities that involve lifting, bending, pushing, or pulling such as laundry, vacuuming, grocery shopping, and childcare. Try to arrange for help from friends and family for these activities while your back heals.  Increase physical activity slowly as tolerated.  Taking short walks is encouraged, but avoid strenuous exercise. Do not jog, run, bicycle, lift weights, or participate in any other exercises unless specifically allowed by your doctor. Avoid prolonged sitting, including car rides.  Talk to your doctor before resuming sexual activity.  You should not drive until cleared by your doctor.  Until released by your doctor, you should not return to work or school.  You should rest at home and let your body heal.   You may shower two days after your surgery.  After showering, lightly dab your incision dry. Do not take a tub bath or go swimming for 3 weeks, or until approved by your doctor at your follow-up appointment.  If you smoke, we strongly recommend that you quit.  Smoking has been proven to interfere with normal healing in your back and will dramatically reduce the success rate of your surgery. Please contact [facility] (800-QUIT-NOW) and use the resources at www.[facility].com for  assistance in stopping smoking.  Surgical Incision   If you have a dressing on your incision, you may remove it three days after your surgery. Keep your incision area clean and dry.  If you have staples or stitches on your incision, you should have a follow up scheduled for removal. If you do not have staples or stitches, you will have steri-strips (small pieces of surgical tape) or Dermabond glue. The steri-strips/glue should begin to peel away within about a week (it is fine if the steri-strips fall off before then). If the strips are still in place one week after your surgery, you may gently remove them.  Diet            You may return to your usual diet. Be sure to stay hydrated.  When to Contact Us  Although your surgery and recovery will likely be uneventful, you may have some residual numbness, aches, and pains in your back and/or legs. This is normal and should improve in the next few weeks.  However, should you experience any of the following, contact us immediately: New numbness or weakness Pain that is progressively getting worse, and is not relieved by your pain medications or rest Bleeding, redness, swelling, pain, or drainage from surgical incision Chills or flu-like symptoms Fever greater than 101.0 F (38.3 C) Problems with bowel or bladder functions Difficulty breathing or shortness of breath Warmth, tenderness, or swelling in your calf  Contact Information During office hours (Monday-Friday 9 am to 5 pm), please call your physician at [phone] and ask for Kendelyn Jean After hours and   weekends, please call [phone] and speak with the neurosurgeon on call For a life-threatening emergency, call 911   AMBULATORY SURGERY  DISCHARGE INSTRUCTIONS   The drugs that you were given will stay in your system until tomorrow so for the next 24 hours you should not:  Drive an automobile Make any legal decisions Drink any alcoholic beverage   You may resume regular  meals tomorrow.  Today it is better to start with liquids and gradually work up to solid foods.  You may eat anything you prefer, but it is better to start with liquids, then soup and crackers, and gradually work up to solid foods.   Please notify your doctor immediately if you have any unusual bleeding, trouble breathing, redness and pain at the surgery site, drainage, fever, or pain not relieved by medication.    Additional Instructions:   Please contact your physician with any problems or Same Day Surgery at [phone], Monday through Friday 6 am to 4 pm, or [place] [facility] Main number at [phone]. 

## 2022-03-24 NOTE — Anesthesia Procedure Notes (Signed)
Procedure Name: Intubation Date/Time: 03/24/2022 7:23 AM  Performed by: Natasha Mead, CRNAPre-anesthesia Checklist: Patient identified, Emergency Drugs available, Suction available and Patient being monitored Patient Re-evaluated:Patient Re-evaluated prior to induction Oxygen Delivery Method: Circle system utilized Preoxygenation: Pre-oxygenation with 100% oxygen Induction Type: IV induction Ventilation: Mask ventilation without difficulty Laryngoscope Size: Miller and 2 Grade View: Grade I Tube type: Oral Tube size: 6.5 mm Number of attempts: 1 Airway Equipment and Method: Stylet and Oral airway Placement Confirmation: ETT inserted through vocal cords under direct vision, positive ETCO2 and breath sounds checked- equal and bilateral Secured at: 20 cm Tube secured with: Tape Dental Injury: Teeth and Oropharynx as per pre-operative assessment

## 2022-04-03 ENCOUNTER — Encounter: Payer: Self-pay | Admitting: Neurosurgery

## 2022-04-04 ENCOUNTER — Ambulatory Visit: Payer: Self-pay | Admitting: Orthopedic Surgery

## 2022-04-05 NOTE — Progress Notes (Signed)
   REFERRING PHYSICIAN:  Leone Haven, Md [address] [place],  [place] [address]  DOS: 03/24/22 Right L2-L3 discectomy           03/07/21 L4- L5 TLIF Lacinda Axon)  HISTORY OF PRESENT ILLNESS: Brenda Farmer is approximately 2 weeks status post right L2-L3 disectomy. Was given robaxin and oxycodone on discharge from the hospital.   She is not taking any medication for her back. Preop numbness and pain in right leg is gone. She is very happy with her results.    PHYSICAL EXAMINATION:  General: Patient is well developed, well nourished, calm, collected, and in no apparent distress.   NEUROLOGICAL:  General: In no acute distress.   Awake, alert, oriented to person, place, and time.  Pupils equal round and reactive to light.  Facial tone is symmetric.     Strength:            Side Iliopsoas Quads Hamstring PF DF EHL  R '5 5 5 5 5 5  '$ L '5 5 5 5 5 5   '$ Incision c/d/I, some minimal swelling superior aspect, no signs of infection.    ROS (Neurologic):  Negative except as noted above  IMAGING: Nothing new to review.   ASSESSMENT/PLAN:  RAYELLE ARMOR is doing well s/p above surgery. Treatment options reviewed with patient and following plan made:   - I have advised the patient to lift up to 10 pounds until 6 weeks after surgery (follow up with Dr. Izora Ribas).  - Reviewed wound care. Area of swelling has been decreasing per patient. Will call if any issues.  - No bending, twisting, or lifting.  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 Given note.  - Follow up as scheduled in 4 weeks and prn.   Advised to contact the office if any questions or concerns arise.  Geronimo Boot PA-C Department of neurosurgery

## 2022-04-07 ENCOUNTER — Encounter: Payer: Self-pay | Admitting: Orthopedic Surgery

## 2022-04-07 ENCOUNTER — Ambulatory Visit (INDEPENDENT_AMBULATORY_CARE_PROVIDER_SITE_OTHER): Payer: BC Managed Care – PPO | Admitting: Orthopedic Surgery

## 2022-04-07 VITALS — BP 103/60 | HR 80 | Temp 99.2°F

## 2022-04-07 DIAGNOSIS — M5416 Radiculopathy, lumbar region: Secondary | ICD-10-CM

## 2022-04-07 DIAGNOSIS — Z09 Encounter for follow-up examination after completed treatment for conditions other than malignant neoplasm: Secondary | ICD-10-CM

## 2022-04-07 DIAGNOSIS — Z9889 Other specified postprocedural states: Secondary | ICD-10-CM

## 2022-04-07 DIAGNOSIS — R29898 Other symptoms and signs involving the musculoskeletal system: Secondary | ICD-10-CM

## 2022-05-04 ENCOUNTER — Ambulatory Visit (INDEPENDENT_AMBULATORY_CARE_PROVIDER_SITE_OTHER): Payer: BC Managed Care – PPO | Admitting: Neurosurgery

## 2022-05-04 ENCOUNTER — Encounter: Payer: Self-pay | Admitting: Neurosurgery

## 2022-05-04 VITALS — BP 104/60 | Temp 98.2°F | Ht <= 58 in | Wt 137.0 lb

## 2022-05-04 DIAGNOSIS — M5416 Radiculopathy, lumbar region: Secondary | ICD-10-CM

## 2022-05-04 DIAGNOSIS — Z09 Encounter for follow-up examination after completed treatment for conditions other than malignant neoplasm: Secondary | ICD-10-CM

## 2022-05-04 NOTE — Progress Notes (Signed)
   REFERRING PHYSICIAN:  Leone Haven, Md [address] [place],  [place] [address]  DOS: 03/24/22 Right L2-L3 discectomy           03/07/21 L4- L5 TLIF Lacinda Axon)  HISTORY OF PRESENT ILLNESS: Brenda Farmer is  status post right L2-L3 disectomy.  She is doing extremely well.  She has no pain.      PHYSICAL EXAMINATION:  General: Patient is well developed, well nourished, calm, collected, and in no apparent distress.   NEUROLOGICAL:  General: In no acute distress.   Awake, alert, oriented to person, place, and time.  Pupils equal round and reactive to light.  Facial tone is symmetric.     Strength:            Side Iliopsoas Quads Hamstring PF DF EHL  R '5 5 5 5 5 5  '$ L '5 5 5 5 5 5   '$ Incision c/d/I, some minimal swelling superior aspect, no signs of infection.    ROS (Neurologic):  Negative except as noted above  IMAGING: Nothing new to review.   ASSESSMENT/PLAN:  Brenda Farmer is doing well s/p above surgery.  I am very pleased with her response to surgery.  We reviewed her activity limitations.  I will see her back in approximately 6 weeks with x-rays.    Meade Maw MD Department of neurosurgery

## 2022-05-05 ENCOUNTER — Ambulatory Visit: Payer: BC Managed Care – PPO | Admitting: Family Medicine

## 2022-05-05 DIAGNOSIS — E039 Hypothyroidism, unspecified: Secondary | ICD-10-CM

## 2022-05-05 DIAGNOSIS — E785 Hyperlipidemia, unspecified: Secondary | ICD-10-CM

## 2022-05-05 DIAGNOSIS — E1165 Type 2 diabetes mellitus with hyperglycemia: Secondary | ICD-10-CM

## 2022-05-24 ENCOUNTER — Ambulatory Visit (INDEPENDENT_AMBULATORY_CARE_PROVIDER_SITE_OTHER): Payer: BC Managed Care – PPO | Admitting: Family Medicine

## 2022-05-24 ENCOUNTER — Encounter: Payer: Self-pay | Admitting: Family Medicine

## 2022-05-24 VITALS — BP 125/70 | HR 88 | Temp 99.1°F | Ht <= 58 in | Wt 132.6 lb

## 2022-05-24 DIAGNOSIS — E039 Hypothyroidism, unspecified: Secondary | ICD-10-CM

## 2022-05-24 DIAGNOSIS — I10 Essential (primary) hypertension: Secondary | ICD-10-CM | POA: Diagnosis not present

## 2022-05-24 DIAGNOSIS — Z1231 Encounter for screening mammogram for malignant neoplasm of breast: Secondary | ICD-10-CM

## 2022-05-24 DIAGNOSIS — E785 Hyperlipidemia, unspecified: Secondary | ICD-10-CM

## 2022-05-24 DIAGNOSIS — E119 Type 2 diabetes mellitus without complications: Secondary | ICD-10-CM

## 2022-05-24 NOTE — Assessment & Plan Note (Signed)
Chronic issue.  Check lipid panel.  Continue Zetia 10 mg daily and Crestor 40 mg daily.

## 2022-05-24 NOTE — Assessment & Plan Note (Signed)
Chronic issue.  Seems to be well-controlled.  She will continue Mounjaro 10 mg daily and metformin 500 mg twice daily and Jardiance 25 mg daily.  Check A1c.

## 2022-05-24 NOTE — Progress Notes (Signed)
Brenda Rumps, MD Phone: [phone]  Brenda Farmer is a 57 y.o. female who presents today for f/u.  HYPERTENSION Disease Monitoring: Blood pressure range-not checking Chest pain- no      Dyspnea- no Medications: Compliance- taking lisinopril, HCTZ    Edema- no  DIABETES Disease Monitoring: Blood Sugar ranges-90s-100s Polyuria/phagia/dipsia- no      Optho- UTD Medications: Compliance- taking mounjaro, metformin, Jardiance hypoglycemic symptoms- no  HYPERLIPIDEMIA Disease Monitoring: See symptoms for Hypertension Medications: Compliance- taking zetia and crestor Right upper quadrant pain- no  Muscle aches- no    Social History   Tobacco Use  Smoking Status Every Day   Packs/day: 0.50   Years: 42.00   Total pack years: 21.00   Types: Cigarettes  Smokeless Tobacco Never  Tobacco Comments   1/2 pack per day    Current Outpatient Medications on File Prior to Visit  Medication Sig Dispense Refill   albuterol (VENTOLIN HFA) 108 (90 Base) MCG/ACT inhaler Inhale 1 puff into the lungs every 6 (six) hours as needed for wheezing or shortness of breath. 8 g 0   diclofenac (VOLTAREN) 75 MG EC tablet TAKE 1 TABLET TWICE A DAY AS NEEDED FOR MILD PAIN 180 tablet 3   escitalopram (LEXAPRO) 10 MG tablet TAKE 1 TABLET DAILY 90 tablet 3   ezetimibe (ZETIA) 10 MG tablet TAKE 1 TABLET DAILY 90 tablet 3   glucose blood (CONTOUR TEST) test strip Use as instructed 100 each 12   hydrochlorothiazide (HYDRODIURIL) 25 MG tablet TAKE 1 TABLET DAILY 90 tablet 3   JARDIANCE 25 MG TABS tablet TAKE 1 TABLET DAILY 90 tablet 3   levothyroxine (SYNTHROID) 125 MCG tablet TAKE 1 TABLET DAILY 90 tablet 3   lisinopril (ZESTRIL) 20 MG tablet TAKE 1 TABLET DAILY 90 tablet 3   metFORMIN (GLUCOPHAGE) 500 MG tablet TAKE 1 TABLET TWICE A DAY WITH MEALS 180 tablet 3   Multiple Vitamin (MULTIVITAMIN) tablet Take 1 tablet by mouth daily.     rosuvastatin (CRESTOR) 40 MG tablet TAKE 1 TABLET DAILY 90  tablet 3   tirzepatide (MOUNJARO) 10 MG/0.5ML Pen Inject 10 mg into the skin once a week. 6 mL 3   No current facility-administered medications on file prior to visit.     ROS see history of present illness  Objective  Physical Exam Vitals:   05/24/22 1610  BP: 125/70  Pulse: 88  Temp: 99.1 F (37.3 C)  SpO2: 96%    BP Readings from Last 3 Encounters:  05/24/22 125/70  05/04/22 104/60  04/07/22 103/60   Wt Readings from Last 3 Encounters:  05/24/22 132 lb 9.6 oz (60.1 kg)  05/04/22 137 lb (62.1 kg)  03/24/22 137 lb (62.1 kg)    Physical Exam Constitutional:      General: She is not in acute distress.    Appearance: She is not diaphoretic.  Cardiovascular:     Rate and Rhythm: Normal rate and regular rhythm.     Heart sounds: Normal heart sounds.  Pulmonary:     Effort: Pulmonary effort is normal.     Breath sounds: Normal breath sounds.  Skin:    General: Skin is warm and dry.  Neurological:     Mental Status: She is alert.      Assessment/Plan: Please see individual problem list.  Controlled type 2 diabetes mellitus without complication, without long-term current use of insulin ([place]) Assessment & Plan: Chronic issue.  Seems to be well-controlled.  She will continue Mounjaro 10 mg daily  and metformin 500 mg twice daily and Jardiance 25 mg daily.  Check A1c.  Orders: -     Comprehensive metabolic panel -     Lipid panel -     Hemoglobin A1c  Hyperlipidemia, unspecified hyperlipidemia type Assessment & Plan: Chronic issue.  Check lipid panel.  Continue Zetia 10 mg daily and Crestor 40 mg daily.  Orders: -     Comprehensive metabolic panel -     Lipid panel  Benign essential HTN Assessment & Plan: Chronic issue.  Adequately controlled.  Patient will continue lisinopril 20 mg daily and HCTZ 25 mg daily.  Orders: -     Comprehensive metabolic panel -     Lipid panel  Hypothyroidism, unspecified type Assessment & Plan: Chronic issue.  Check  TSH.  Continue Synthroid 125 mcg daily.  Orders: -     TSH  Encounter for screening mammogram for malignant neoplasm of breast -     3D Screening Mammogram, Left and Right; Future     Health Maintenance: Patient will call to schedule her mammogram.  She reports having had hepatitis C test in the past.  We will confirm the dates of her Shingrix vaccine and get these entered into the chart.  Return in about 6 months (around 11/22/2022).   Brenda Rumps, MD [place]

## 2022-05-24 NOTE — Assessment & Plan Note (Addendum)
Chronic issue.  Adequately controlled.  Patient will continue lisinopril 20 mg daily and HCTZ 25 mg daily.

## 2022-05-24 NOTE — Assessment & Plan Note (Signed)
Chronic issue.  Check TSH.  Continue Synthroid 125 mcg daily.

## 2022-05-25 LAB — LIPID PANEL
Cholesterol: 118 mg/dL (ref 0–200)
HDL: 41.4 mg/dL (ref >39.00)
NonHDL: 76.39
Total CHOL/HDL Ratio: 3
Triglycerides: 236 mg/dL — ABNORMAL HIGH (ref 0.0–149.0)
VLDL: 47.2 mg/dL — ABNORMAL HIGH (ref 0.0–40.0)

## 2022-05-25 LAB — COMPREHENSIVE METABOLIC PANEL
ALT: 50 U/L — ABNORMAL HIGH (ref 0–35)
AST: 30 U/L (ref 0–37)
Albumin: 4.7 g/dL (ref 3.5–5.2)
Alkaline Phosphatase: 68 U/L (ref 39–117)
BUN: 18 mg/dL (ref 6–23)
CO2: 27 mEq/L (ref 19–32)
Calcium: 10.3 mg/dL (ref 8.4–10.5)
Chloride: 100 mEq/L (ref 96–112)
Creatinine, Ser: 0.65 mg/dL (ref 0.40–1.20)
GFR: 97.72 mL/min (ref >60.00)
Glucose, Bld: 124 mg/dL — ABNORMAL HIGH (ref 70–99)
Potassium: 3.7 mEq/L (ref 3.5–5.1)
Sodium: 140 mEq/L (ref 135–145)
Total Bilirubin: 0.4 mg/dL (ref 0.2–1.2)
Total Protein: 6.7 g/dL (ref 6.0–8.3)

## 2022-05-25 LAB — HEMOGLOBIN A1C: Hgb A1c MFr Bld: 6.9 % — ABNORMAL HIGH (ref 4.6–6.5)

## 2022-05-25 LAB — LDL CHOLESTEROL, DIRECT: Direct LDL: 49 mg/dL

## 2022-05-25 LAB — TSH: TSH: 2.33 u[IU]/mL (ref 0.35–5.50)

## 2022-05-26 ENCOUNTER — Other Ambulatory Visit: Payer: Self-pay

## 2022-05-26 DIAGNOSIS — R748 Abnormal levels of other serum enzymes: Secondary | ICD-10-CM

## 2022-06-09 ENCOUNTER — Other Ambulatory Visit (INDEPENDENT_AMBULATORY_CARE_PROVIDER_SITE_OTHER): Payer: BC Managed Care – PPO

## 2022-06-09 DIAGNOSIS — R748 Abnormal levels of other serum enzymes: Secondary | ICD-10-CM | POA: Diagnosis not present

## 2022-06-09 NOTE — Addendum Note (Signed)
Addended by: Neta Ehlers on: 06/09/2022 01:18 PM   Modules accepted: Orders

## 2022-06-09 NOTE — Addendum Note (Signed)
Addended by: Neta Ehlers on: 06/09/2022 02:10 PM   Modules accepted: Orders

## 2022-06-10 LAB — HEPATIC FUNCTION PANEL
ALT: 83 IU/L — ABNORMAL HIGH (ref 0–32)
AST: 43 IU/L — ABNORMAL HIGH (ref 0–40)
Albumin: 4.6 g/dL (ref 3.8–4.9)
Alkaline Phosphatase: 81 IU/L (ref 44–121)
Bilirubin Total: 0.2 mg/dL (ref 0.0–1.2)
Bilirubin, Direct: <0.1 mg/dL (ref 0.00–0.40)
Total Protein: 6.5 g/dL (ref 6.0–8.5)

## 2022-06-13 ENCOUNTER — Ambulatory Visit (INDEPENDENT_AMBULATORY_CARE_PROVIDER_SITE_OTHER): Payer: BC Managed Care – PPO | Admitting: Neurosurgery

## 2022-06-13 ENCOUNTER — Encounter: Payer: Self-pay | Admitting: Neurosurgery

## 2022-06-13 VITALS — BP 114/74 | Temp 98.7°F | Ht <= 58 in | Wt 132.0 lb

## 2022-06-13 DIAGNOSIS — Z09 Encounter for follow-up examination after completed treatment for conditions other than malignant neoplasm: Secondary | ICD-10-CM

## 2022-06-13 DIAGNOSIS — M5416 Radiculopathy, lumbar region: Secondary | ICD-10-CM

## 2022-06-13 NOTE — Progress Notes (Signed)
   REFERRING PHYSICIAN:  Leone Haven, Md [address] [place],  [place] [address]  DOS: 03/24/22 Right L2-L3 discectomy           03/07/21 L4- L5 TLIF Brenda Farmer)  HISTORY OF PRESENT ILLNESS: Brenda Farmer is  status post right L2-L3 disectomy.  She is doing extremely well.  She has no pain.      PHYSICAL EXAMINATION:  General: Patient is well developed, well nourished, calm, collected, and in no apparent distress.   NEUROLOGICAL:  General: In no acute distress.   Awake, alert, oriented to person, place, and time.  Pupils equal round and reactive to light.  Facial tone is symmetric.     Strength:            Side Iliopsoas Quads Hamstring PF DF EHL  R '5 5 5 5 5 5  '$ L '5 5 5 5 5 5   '$ Incision c/d/I, some minimal swelling superior aspect, no signs of infection.    ROS (Neurologic):  Negative except as noted above  IMAGING: Nothing new to review.   ASSESSMENT/PLAN:  Brenda Farmer is doing well s/p above surgery.  I am very pleased with her response to surgery.  I will see her back on an as needed basis.  Meade Maw MD Department of neurosurgery

## 2022-06-15 NOTE — Progress Notes (Signed)
Attempted to call pharm - no ans phone just rang x 2

## 2022-07-06 ENCOUNTER — Telehealth: Payer: Self-pay | Admitting: Family Medicine

## 2022-07-06 DIAGNOSIS — R7989 Other specified abnormal findings of blood chemistry: Secondary | ICD-10-CM

## 2022-07-06 NOTE — Telephone Encounter (Signed)
Patient has a lab appt 07/11/2022, there are No orders in.

## 2022-07-10 NOTE — Telephone Encounter (Signed)
Labs ordered.

## 2022-07-10 NOTE — Addendum Note (Signed)
Addended by: Caryl Bis Berwyn Bigley G on: 07/10/2022 12:58 PM   Modules accepted: Orders

## 2022-07-11 ENCOUNTER — Other Ambulatory Visit (INDEPENDENT_AMBULATORY_CARE_PROVIDER_SITE_OTHER): Payer: BC Managed Care – PPO

## 2022-07-11 ENCOUNTER — Other Ambulatory Visit: Payer: Self-pay

## 2022-07-11 DIAGNOSIS — E785 Hyperlipidemia, unspecified: Secondary | ICD-10-CM

## 2022-07-11 DIAGNOSIS — R7989 Other specified abnormal findings of blood chemistry: Secondary | ICD-10-CM

## 2022-07-11 LAB — HEPATIC FUNCTION PANEL
ALT: 23 U/L (ref 0–35)
AST: 19 U/L (ref 0–37)
Albumin: 4.5 g/dL (ref 3.5–5.2)
Alkaline Phosphatase: 58 U/L (ref 39–117)
Bilirubin, Direct: 0.1 mg/dL (ref 0.0–0.3)
Total Bilirubin: 0.5 mg/dL (ref 0.2–1.2)
Total Protein: 7.3 g/dL (ref 6.0–8.3)

## 2022-07-11 NOTE — Addendum Note (Signed)
Addended by: Jeralyn Bennett A on: 07/11/2022 01:44 PM   Modules accepted: Orders

## 2022-07-31 ENCOUNTER — Other Ambulatory Visit: Payer: Self-pay | Admitting: Family Medicine

## 2022-08-17 ENCOUNTER — Other Ambulatory Visit: Payer: Self-pay | Admitting: Family Medicine

## 2022-08-17 DIAGNOSIS — E782 Mixed hyperlipidemia: Secondary | ICD-10-CM

## 2022-08-17 DIAGNOSIS — E119 Type 2 diabetes mellitus without complications: Secondary | ICD-10-CM

## 2022-08-24 ENCOUNTER — Ambulatory Visit
Admission: RE | Admit: 2022-08-24 | Discharge: 2022-08-24 | Disposition: A | Discharge status: Home / Self Care | Payer: BC Managed Care – PPO | Source: Ambulatory Visit | Attending: Family Medicine | Admitting: Family Medicine

## 2022-08-24 DIAGNOSIS — Z1231 Encounter for screening mammogram for malignant neoplasm of breast: Secondary | ICD-10-CM | POA: Insufficient documentation

## 2022-08-28 ENCOUNTER — Other Ambulatory Visit (INDEPENDENT_AMBULATORY_CARE_PROVIDER_SITE_OTHER): Payer: BC Managed Care – PPO

## 2022-08-28 ENCOUNTER — Other Ambulatory Visit: Payer: Self-pay | Admitting: Family Medicine

## 2022-08-28 DIAGNOSIS — E785 Hyperlipidemia, unspecified: Secondary | ICD-10-CM

## 2022-08-28 LAB — HEPATIC FUNCTION PANEL
ALT: 27 U/L (ref 0–35)
AST: 23 U/L (ref 0–37)
Albumin: 4.7 g/dL (ref 3.5–5.2)
Alkaline Phosphatase: 65 U/L (ref 39–117)
Bilirubin, Direct: 0.1 mg/dL (ref 0.0–0.3)
Total Bilirubin: 0.4 mg/dL (ref 0.2–1.2)
Total Protein: 7 g/dL (ref 6.0–8.3)

## 2022-08-28 LAB — LIPID PANEL
Cholesterol: 169 mg/dL (ref 0–200)
HDL: 48.3 mg/dL (ref >39.00)
LDL Cholesterol: 101 mg/dL — ABNORMAL HIGH (ref 0–99)
NonHDL: 121.11
Total CHOL/HDL Ratio: 4
Triglycerides: 99 mg/dL (ref 0.0–149.0)
VLDL: 19.8 mg/dL (ref 0.0–40.0)

## 2022-09-12 ENCOUNTER — Other Ambulatory Visit: Payer: Self-pay | Admitting: Family Medicine

## 2022-09-12 DIAGNOSIS — I10 Essential (primary) hypertension: Secondary | ICD-10-CM

## 2022-10-03 ENCOUNTER — Ambulatory Visit: Payer: BC Managed Care – PPO | Attending: Family Medicine

## 2022-10-06 ENCOUNTER — Other Ambulatory Visit: Payer: Self-pay

## 2022-10-06 DIAGNOSIS — F17210 Nicotine dependence, cigarettes, uncomplicated: Secondary | ICD-10-CM

## 2022-10-06 DIAGNOSIS — Z87891 Personal history of nicotine dependence: Secondary | ICD-10-CM

## 2022-10-06 DIAGNOSIS — Z122 Encounter for screening for malignant neoplasm of respiratory organs: Secondary | ICD-10-CM

## 2022-10-09 ENCOUNTER — Ambulatory Visit
Admission: RE | Admit: 2022-10-09 | Discharge: 2022-10-09 | Disposition: A | Discharge status: Home / Self Care | Payer: BC Managed Care – PPO | Source: Ambulatory Visit | Attending: Acute Care | Admitting: Acute Care

## 2022-10-09 DIAGNOSIS — F17210 Nicotine dependence, cigarettes, uncomplicated: Secondary | ICD-10-CM | POA: Insufficient documentation

## 2022-10-09 DIAGNOSIS — Z87891 Personal history of nicotine dependence: Secondary | ICD-10-CM

## 2022-10-09 DIAGNOSIS — Z122 Encounter for screening for malignant neoplasm of respiratory organs: Secondary | ICD-10-CM

## 2022-10-13 ENCOUNTER — Other Ambulatory Visit: Payer: Self-pay | Admitting: Acute Care

## 2022-10-13 DIAGNOSIS — Z87891 Personal history of nicotine dependence: Secondary | ICD-10-CM

## 2022-10-13 DIAGNOSIS — Z122 Encounter for screening for malignant neoplasm of respiratory organs: Secondary | ICD-10-CM

## 2022-10-13 DIAGNOSIS — F17210 Nicotine dependence, cigarettes, uncomplicated: Secondary | ICD-10-CM

## 2022-10-25 ENCOUNTER — Other Ambulatory Visit (INDEPENDENT_AMBULATORY_CARE_PROVIDER_SITE_OTHER): Payer: BC Managed Care – PPO

## 2022-10-25 DIAGNOSIS — E785 Hyperlipidemia, unspecified: Secondary | ICD-10-CM | POA: Diagnosis not present

## 2022-10-25 LAB — HEPATIC FUNCTION PANEL
ALT: 54 U/L — ABNORMAL HIGH (ref 0–35)
AST: 31 U/L (ref 0–37)
Albumin: 4.6 g/dL (ref 3.5–5.2)
Alkaline Phosphatase: 63 U/L (ref 39–117)
Bilirubin, Direct: 0.1 mg/dL (ref 0.0–0.3)
Total Bilirubin: 0.5 mg/dL (ref 0.2–1.2)
Total Protein: 7.3 g/dL (ref 6.0–8.3)

## 2022-10-25 LAB — LDL CHOLESTEROL, DIRECT: Direct LDL: 57 mg/dL

## 2022-10-26 ENCOUNTER — Other Ambulatory Visit: Payer: Self-pay

## 2022-10-26 DIAGNOSIS — R7989 Other specified abnormal findings of blood chemistry: Secondary | ICD-10-CM

## 2022-11-02 ENCOUNTER — Other Ambulatory Visit: Payer: Self-pay

## 2022-11-02 ENCOUNTER — Telehealth: Payer: Self-pay | Admitting: Family Medicine

## 2022-11-02 DIAGNOSIS — E1165 Type 2 diabetes mellitus with hyperglycemia: Secondary | ICD-10-CM

## 2022-11-02 MED ORDER — TIRZEPATIDE 10 MG/0.5ML ~~LOC~~ SOAJ
10.0000 mg | SUBCUTANEOUS | 3 refills | Status: DC
Start: 2022-11-02 — End: 2023-10-02

## 2022-11-02 NOTE — Telephone Encounter (Signed)
Prescription Request  11/02/2022  LOV: 05/24/2022  What is the name of the medication or equipment? levothyroxine (SYNTHROID) 125 MCG tablet and tirzepatide Greggory Keen) 10 MG/0.5ML Pen   Have you contacted your pharmacy to request a refill? Yes   Which pharmacy would you like this sent to?  [facility] [address] [place] - [address] [place] [address] Phone: [phone] Fax: [phone]  [facility] - [place], [place] - [address] [place] [address] Phone: [phone] Fax: [phone]    Patient notified that their request is being sent to the clinical staff for review and that they should receive a response within 2 business days.   Please advise at Mobile [phone] (mobile)   As per pt, Greggory Keen goes to [facility], and [facility].

## 2022-11-08 ENCOUNTER — Other Ambulatory Visit: Payer: Self-pay

## 2022-11-08 DIAGNOSIS — E039 Hypothyroidism, unspecified: Secondary | ICD-10-CM

## 2022-11-08 MED ORDER — LEVOTHYROXINE SODIUM 125 MCG PO TABS
125.0000 ug | ORAL_TABLET | Freq: Every day | ORAL | 1 refills | Status: DC
Start: 2022-11-08 — End: 2023-06-06

## 2022-11-15 ENCOUNTER — Other Ambulatory Visit: Payer: Self-pay | Admitting: Family Medicine

## 2022-11-15 DIAGNOSIS — E782 Mixed hyperlipidemia: Secondary | ICD-10-CM

## 2022-11-24 ENCOUNTER — Encounter: Payer: Self-pay | Admitting: Family Medicine

## 2022-11-24 ENCOUNTER — Ambulatory Visit: Payer: BC Managed Care – PPO | Admitting: Family Medicine

## 2022-11-24 VITALS — BP 116/78 | HR 83 | Temp 98.6°F | Ht <= 58 in | Wt 131.0 lb

## 2022-11-24 DIAGNOSIS — E119 Type 2 diabetes mellitus without complications: Secondary | ICD-10-CM | POA: Diagnosis not present

## 2022-11-24 DIAGNOSIS — E785 Hyperlipidemia, unspecified: Secondary | ICD-10-CM | POA: Diagnosis not present

## 2022-11-24 DIAGNOSIS — M5416 Radiculopathy, lumbar region: Secondary | ICD-10-CM

## 2022-11-24 DIAGNOSIS — Z7985 Long-term (current) use of injectable non-insulin antidiabetic drugs: Secondary | ICD-10-CM

## 2022-11-24 DIAGNOSIS — Z7984 Long term (current) use of oral hypoglycemic drugs: Secondary | ICD-10-CM

## 2022-11-24 DIAGNOSIS — I10 Essential (primary) hypertension: Secondary | ICD-10-CM

## 2022-11-24 MED ORDER — GABAPENTIN 300 MG PO CAPS
300.0000 mg | ORAL_CAPSULE | Freq: Every day | ORAL | 1 refills | Status: DC
Start: 2022-11-24 — End: 2023-05-04

## 2022-11-24 NOTE — Assessment & Plan Note (Signed)
Chronic issue.  Well-controlled.  She will continue lisinopril 20 mg daily and HCTZ 25 mg daily.

## 2022-11-24 NOTE — Assessment & Plan Note (Signed)
Chronic issue.  Check A1c.  She will continue Mounjaro 10 mg daily, metformin 500 mg twice daily, and Jardiance 25 mg daily.

## 2022-11-24 NOTE — Assessment & Plan Note (Signed)
Chronic issue.  We will start gabapentin 300 mg nightly.  She will monitor for drowsiness, vision changes, balance issues, and confusion with this.  If she has any side effects she will let us know.

## 2022-11-24 NOTE — Progress Notes (Signed)
Brenda Alar, MD Phone: [phone]  Brenda Farmer is a 57 y.o. female who presents today for f/u  HYPERTENSION Disease Monitoring  Chest pain- no    Dyspnea- no Medications Compliance-  taking hydrochlorothiazide, lisinopril.  Edema- no BMET    Component Value Date/Time   NA 140 05/24/2022 1620   NA 137 06/26/2014 0000   K 3.7 05/24/2022 1620   CL 100 05/24/2022 1620   CO2 27 05/24/2022 1620   GLUCOSE 124 (H) 05/24/2022 1620   BUN 18 05/24/2022 1620   BUN 14 06/26/2014 0000   CREATININE 0.65 05/24/2022 1620   CREATININE 0.86 10/28/2021 1629   CALCIUM 10.3 05/24/2022 1620   GFRNONAA >60 03/13/2022 1309   GFRAA 141 07/15/2007 1610   DIABETES Disease Monitoring: Blood Sugar ranges-<110 Polyuria/phagia/dipsia- no      Optho- UTD Medications: Compliance- taking jardiance, metformin, mounjaro Hypoglycemic symptoms- notes 1-2x/week drops to the 60s  Chronic back pain: She notes continued to have some nerve pain from her back.  At times it keeps her awake at night.  It kicks in when she has been on her feet longer than usual.  Prior to having back surgery she was on gabapentin which was beneficial at that time.  She does not want another surgery.  She does not want another MRI.   Social History   Tobacco Use  Smoking Status Every Day   Packs/day: 0.50   Years: 42.00   Additional pack years: 0.00   Total pack years: 21.00   Types: Cigarettes  Smokeless Tobacco Never  Tobacco Comments   1/2 pack per day    Current Outpatient Medications on File Prior to Visit  Medication Sig Dispense Refill   diclofenac (VOLTAREN) 75 MG EC tablet TAKE 1 TABLET TWICE A DAY AS NEEDED FOR MILD PAIN 180 tablet 1   escitalopram (LEXAPRO) 10 MG tablet TAKE 1 TABLET DAILY 90 tablet 3   glucose blood (CONTOUR TEST) test strip Use as instructed 100 each 12   hydrochlorothiazide (HYDRODIURIL) 25 MG tablet TAKE 1 TABLET DAILY 90 tablet 3   JARDIANCE 25 MG TABS tablet TAKE 1 TABLET  DAILY 90 tablet 3   levothyroxine (SYNTHROID) 125 MCG tablet Take 1 tablet (125 mcg total) by mouth daily. 90 tablet 1   lisinopril (ZESTRIL) 20 MG tablet TAKE 1 TABLET DAILY 90 tablet 1   metFORMIN (GLUCOPHAGE) 500 MG tablet TAKE 1 TABLET TWICE A DAY WITH MEALS 180 tablet 3   Multiple Vitamin (MULTIVITAMIN) tablet Take 1 tablet by mouth daily.     rosuvastatin (CRESTOR) 40 MG tablet TAKE 1 TABLET DAILY 90 tablet 1   tirzepatide (MOUNJARO) 10 MG/0.5ML Pen Inject 10 mg into the skin once a week. 6 mL 3   No current facility-administered medications on file prior to visit.     ROS see history of present illness  Objective  Physical Exam Vitals:   11/24/22 1517  BP: 116/78  Pulse: 83  Temp: 98.6 F (37 C)  SpO2: 94%    BP Readings from Last 3 Encounters:  11/24/22 116/78  06/13/22 114/74  05/24/22 125/70   Wt Readings from Last 3 Encounters:  11/24/22 131 lb (59.4 kg)  06/13/22 132 lb (59.9 kg)  05/24/22 132 lb 9.6 oz (60.1 kg)    Physical Exam Constitutional:      General: She is not in acute distress.    Appearance: She is not diaphoretic.  Cardiovascular:     Rate and Rhythm: Normal rate and regular  rhythm.     Heart sounds: Normal heart sounds.  Pulmonary:     Effort: Pulmonary effort is normal.     Breath sounds: Normal breath sounds.  Musculoskeletal:     Right lower leg: No edema.     Left lower leg: No edema.  Skin:    General: Skin is warm and dry.  Neurological:     Mental Status: She is alert.      Assessment/Plan: Please see individual problem list.  Benign essential HTN Assessment & Plan: Chronic issue.  Well-controlled.  She will continue lisinopril 20 mg daily and HCTZ 25 mg daily.   Controlled type 2 diabetes mellitus without complication, without long-term current use of insulin (HCC) Assessment & Plan: Chronic issue.  Check A1c.  She will continue Mounjaro 10 mg daily, metformin 500 mg twice daily, and Jardiance 25 mg  daily.  Orders: -     Microalbumin / creatinine urine ratio -     Hemoglobin A1c  Hyperlipidemia, unspecified hyperlipidemia type -     Hepatic function panel  Lumbar radiculopathy Assessment & Plan: Chronic issue.  We will start gabapentin 300 mg nightly.  She will monitor for drowsiness, vision changes, balance issues, and confusion with this.  If she has any side effects she will let us know.  Orders: -     Gabapentin; Take 1 capsule (300 mg total) by mouth at bedtime.  Dispense: 90 capsule; Refill: 1     Return in about 6 months (around 05/27/2023).   Brenda Alar, MD [facility]

## 2022-11-25 LAB — HEPATIC FUNCTION PANEL
AG Ratio: 2 (calc) (ref 1.0–2.5)
ALT: 28 U/L (ref 6–29)
AST: 20 U/L (ref 10–35)
Albumin: 4.7 g/dL (ref 3.6–5.1)
Alkaline phosphatase (APISO): 76 U/L (ref 37–153)
Bilirubin, Direct: 0.1 mg/dL (ref 0.0–0.2)
Globulin: 2.3 g/dL (calc) (ref 1.9–3.7)
Indirect Bilirubin: 0.3 mg/dL (calc) (ref 0.2–1.2)
Total Bilirubin: 0.4 mg/dL (ref 0.2–1.2)
Total Protein: 7 g/dL (ref 6.1–8.1)

## 2022-11-25 LAB — MICROALBUMIN / CREATININE URINE RATIO
Creatinine, Urine: 47 mg/dL (ref 20–275)
Microalb Creat Ratio: 296 mg/g creat — ABNORMAL HIGH (ref <30)
Microalb, Ur: 13.9 mg/dL

## 2022-11-25 LAB — HEMOGLOBIN A1C
Hgb A1c MFr Bld: 6.4 % of total Hgb — ABNORMAL HIGH (ref <5.7)
Mean Plasma Glucose: 137 mg/dL
eAG (mmol/L): 7.6 mmol/L

## 2022-11-28 ENCOUNTER — Other Ambulatory Visit: Payer: Self-pay | Admitting: Family Medicine

## 2022-11-28 DIAGNOSIS — R809 Proteinuria, unspecified: Secondary | ICD-10-CM

## 2023-01-24 ENCOUNTER — Other Ambulatory Visit: Payer: Self-pay | Admitting: Nephrology

## 2023-01-24 DIAGNOSIS — R809 Proteinuria, unspecified: Secondary | ICD-10-CM

## 2023-01-24 DIAGNOSIS — E1122 Type 2 diabetes mellitus with diabetic chronic kidney disease: Secondary | ICD-10-CM

## 2023-01-24 DIAGNOSIS — R8290 Unspecified abnormal findings in urine: Secondary | ICD-10-CM

## 2023-02-02 ENCOUNTER — Other Ambulatory Visit: Payer: Self-pay | Admitting: Family Medicine

## 2023-02-02 DIAGNOSIS — E119 Type 2 diabetes mellitus without complications: Secondary | ICD-10-CM

## 2023-02-23 LAB — HM DIABETES EYE EXAM

## 2023-02-28 ENCOUNTER — Encounter: Payer: Self-pay | Admitting: Family Medicine

## 2023-03-03 ENCOUNTER — Ambulatory Visit
Admission: EM | Admit: 2023-03-03 | Discharge: 2023-03-03 | Disposition: A | Discharge status: Home / Self Care | Payer: BC Managed Care – PPO | Attending: Emergency Medicine | Admitting: Emergency Medicine

## 2023-03-03 DIAGNOSIS — J0140 Acute pansinusitis, unspecified: Secondary | ICD-10-CM

## 2023-03-03 MED ORDER — FLUCONAZOLE 150 MG PO TABS: 150.0000 mg | ORAL_TABLET | Freq: Every day | ORAL | 0 refills | Status: AC

## 2023-03-03 MED ORDER — AMOXICILLIN-POT CLAVULANATE 875-125 MG PO TABS: 1.0000 | ORAL_TABLET | Freq: Two times a day (BID) | ORAL | 0 refills | Status: AC

## 2023-03-03 NOTE — ED Triage Notes (Signed)
Patient presents to UC nasal congestion, mild chest congestion, HA since last Thursday. Concerned with sinus infection. Treating with OTC meds.

## 2023-03-03 NOTE — ED Provider Notes (Signed)
Renaldo Fiddler    CSN: [id number] Arrival date & time: 03/03/23  1123      History   Chief Complaint Chief Complaint  Patient presents with   Facial Pain    HPI Brenda Farmer is a 57 y.o. female.   Patient presents for evaluation of persisting nasal congestion, rhinorrhea, bilateral ear popping, sore throat, sinus pain and pressure, productive cough and headaches present for 9 days.  Sore throat has resolved.  Sinus pain and pressure affecting the forehead the cheeks and the nose bridge.  Headaches occurring intermittently affecting the frontal aspect.  No known sick contacts prior.  Tolerating food and liquids.  Has attempted use of Mucinex and Tylenol which has been somewhat helpful but symptoms have not resolved.  History of COPD.  Denies shortness of breath or wheezing.  Denies fever.    Past Medical History:  Diagnosis Date   Arthritis    Chicken pox    Diabetes mellitus without complication (HCC)    Diabetic eye exam (HCC)    History of 2019 novel coronavirus disease (COVID-19) 01/13/2021   History of mammogram 08/16/2012   neg   History of Papanicolaou smear of cervix 06/21/2012   neg/neg   Hyperlipidemia    Hypertension    Hypothyroidism (acquired)    Obesity    Sleep apnea    Thyroid disease     Patient Active Problem List   Diagnosis Date Noted   Right leg weakness 03/24/2022   Yeast infection 10/28/2021   Pain of toe of right foot 04/28/2021   Contusion of lesser toe of right foot 04/28/2021   Status post lumbar spinal fusion 04/01/2021   Lumbar radiculopathy 03/07/2021   Depression, major, single episode, moderate (HCC) 12/01/2019   Cervical radiculopathy 02/28/2019   Skin nodule 10/19/2017   Urine frequency 11/08/2016   Overweight 07/26/2016   Low back pain 06/21/2016   Nicotine dependence, cigarettes, uncomplicated 01/26/2016   Numbness and tingling of right distal lateral thigh 01/26/2016   Tendinopathy of left rotator cuff  10/21/2015   Hyperlipidemia 10/21/2015   Controlled type 2 diabetes mellitus (HCC) 02/14/2015   NASH (nonalcoholic steatohepatitis) 11/09/2014   Benign essential HTN 10/05/2014   COPD (chronic obstructive pulmonary disease) (HCC) 10/27/2009   Hypothyroidism 08/15/2006   OSA (obstructive sleep apnea) 08/15/2006    Past Surgical History:  Procedure Laterality Date   ABDOMINAL HYSTERECTOMY  05/22/2010   Cervix & ovaries intact   APPENDECTOMY     CESAREAN SECTION     x 2   COLONOSCOPY WITH PROPOFOL N/A 11/05/2015   Procedure: COLONOSCOPY WITH PROPOFOL;  Surgeon: Christena Deem, MD;  Location: [facility] ENDOSCOPY;  Service: Endoscopy;  Laterality: N/A;   LUMBAR LAMINECTOMY/DECOMPRESSION MICRODISCECTOMY Right 03/24/2022   Procedure: RIGHT L2-3 MICRODISCECTOMY;  Surgeon: Venetia Night, MD;  Location: [facility] ORS;  Service: Neurosurgery;  Laterality: Right;   MAXIMUM ACCESS (MAS) TRANSFORAMINAL LUMBAR INTERBODY FUSION (TLIF) 1 LEVEL N/A 03/07/2021   Procedure: L4-5 MAXIMUM ACCESS (MAS) TRANSFORAMINAL LUMBAR INTERBODY FUSION (TLIF) 1 LEVEL;  Surgeon: Lucy Chris, MD;  Location: [facility] ORS;  Service: Neurosurgery;  Laterality: N/A;    OB History   No obstetric history on file.      Home Medications    Prior to Admission medications   Medication Sig Start Date End Date Taking? Authorizing Provider  amoxicillin-clavulanate (AUGMENTIN) 875-125 MG tablet Take 1 tablet by mouth every 12 (twelve) hours for 10 days. 03/03/23 03/13/23 Yes Nazly Digilio, Elita Boone, NP  fluconazole (DIFLUCAN) 150  MG tablet Take 1 tablet (150 mg total) by mouth daily for 2 doses. 03/03/23 03/05/23 Yes Kabrea Seeney R, NP  diclofenac (VOLTAREN) 75 MG EC tablet TAKE 1 TABLET TWICE A DAY AS NEEDED FOR MILD PAIN 11/16/22   Glori Luis, MD  escitalopram (LEXAPRO) 10 MG tablet TAKE 1 TABLET DAILY 07/31/22   Glori Luis, MD  gabapentin (NEURONTIN) 300 MG capsule Take 1 capsule (300 mg total) by mouth at bedtime.  11/24/22   Glori Luis, MD  glucose blood (CONTOUR TEST) test strip Use as instructed 11/21/17   Glori Luis, MD  hydrochlorothiazide (HYDRODIURIL) 25 MG tablet TAKE 1 TABLET DAILY 09/12/22   Glori Luis, MD  JARDIANCE 25 MG TABS tablet TAKE 1 TABLET DAILY 02/05/23   Glori Luis, MD  levothyroxine (SYNTHROID) 125 MCG tablet Take 1 tablet (125 mcg total) by mouth daily. 11/08/22   Glori Luis, MD  lisinopril (ZESTRIL) 20 MG tablet TAKE 1 TABLET DAILY 11/16/22   Glori Luis, MD  metFORMIN (GLUCOPHAGE) 500 MG tablet TAKE 1 TABLET TWICE A DAY WITH MEALS 08/17/22   Glori Luis, MD  Multiple Vitamin (MULTIVITAMIN) tablet Take 1 tablet by mouth daily.    [provider]  rosuvastatin (CRESTOR) 40 MG tablet TAKE 1 TABLET DAILY 11/16/22   Glori Luis, MD  tirzepatide [facility]) 10 MG/0.5ML Pen Inject 10 mg into the skin once a week. 11/02/22   Glori Luis, MD    Family History Family History  Problem Relation Age of Onset   Arthritis Mother    Hyperlipidemia Mother    Cervical cancer Mother 57       Malignant   Alcohol abuse Father    Hyperlipidemia Father    Heart disease Father    Hypertension Father    Stroke Maternal Grandmother    Diabetes Maternal Grandmother    Arthritis Maternal Grandfather    Diabetes Maternal Grandfather        DM Type 2   Asthma Paternal Grandmother    Heart disease Paternal Grandmother    Lung cancer Paternal Grandmother        Malignant   Prostate cancer Paternal Grandfather    Hyperlipidemia Paternal Grandfather    Hypertension Paternal Grandfather    Heart disease Paternal Grandfather    Lung cancer Paternal Grandfather    Breast cancer Neg Hx     Social History Social History   Tobacco Use   Smoking status: Every Day    Current packs/day: 0.50    Average packs/day: 0.5 packs/day for 42.0 years (21.0 ttl pk-yrs)    Types: Cigarettes   Smokeless tobacco: Never   Tobacco comments:     1/2 pack per day  Vaping Use   Vaping status: Never Used  Substance Use Topics   Alcohol use: Yes    Alcohol/week: 0.0 - 1.0 standard drinks of alcohol    Comment: Rare usage    Drug use: No     Allergies   Codeine   Review of Systems Review of Systems   Physical Exam Triage Vital Signs ED Triage Vitals  Encounter Vitals Group     BP 03/03/23 1130 115/72     Systolic BP Percentile --      Diastolic BP Percentile --      Pulse Rate 03/03/23 1130 95     Resp 03/03/23 1130 18     Temp 03/03/23 1130 97.6 F (36.4 C)     Temp  Source 03/03/23 1130 Temporal     SpO2 03/03/23 1130 93 %     Weight --      Height --      Head Circumference --      Peak Flow --      Pain Score 03/03/23 1129 1     Pain Loc --      Pain Education --      Exclude from Growth Chart --    No data found.  Updated Vital Signs BP 115/72 (BP Location: Left Arm)   Pulse 95   Temp 97.6 F (36.4 C) (Temporal)   Resp 18   SpO2 93%   Visual Acuity Right Eye Distance:   Left Eye Distance:   Bilateral Distance:    Right Eye Near:   Left Eye Near:    Bilateral Near:     Physical Exam Constitutional:      Appearance: Normal appearance.  HENT:     Head: Normocephalic.     Right Ear: Tympanic membrane, ear canal and external ear normal.     Left Ear: Tympanic membrane, ear canal and external ear normal.     Nose: Congestion and rhinorrhea present.     Right Sinus: Maxillary sinus tenderness and frontal sinus tenderness present.     Left Sinus: Maxillary sinus tenderness and frontal sinus tenderness present.     Mouth/Throat:     Pharynx: No oropharyngeal exudate or posterior oropharyngeal erythema.  Eyes:     Extraocular Movements: Extraocular movements intact.  Cardiovascular:     Rate and Rhythm: Normal rate and regular rhythm.     Pulses: Normal pulses.     Heart sounds: Normal heart sounds.  Pulmonary:     Effort: Pulmonary effort is normal.     Breath sounds: Normal breath  sounds.  Musculoskeletal:     Cervical back: Normal range of motion and neck supple.  Neurological:     Mental Status: She is alert and oriented to person, place, and time. Mental status is at baseline.      UC Treatments / Results  Labs (all labs ordered are listed, but only abnormal results are displayed) Labs Reviewed - No data to display  EKG   Radiology No results found.  Procedures Procedures (including critical care time)  Medications Ordered in UC Medications - No data to display  Initial Impression / Assessment and Plan / UC Course  I have reviewed the triage vital signs and the nursing notes.  Pertinent labs & imaging results that were available during my care of the patient were reviewed by me and considered in my medical decision making (see chart for details).  Acute nonrecurrent pansinusitis  Patient is in no signs of distress nor toxic appearing.  Vital signs are stable.  Low suspicion for pneumonia, pneumothorax or bronchitis and therefore will defer imaging.  Presentation of symptoms consistent with a sinusitis and as they have persisted for 9 days without signs of resolution can and will provide bacterial coverage, Augmentin sent to pharmacy, Diflucan sent prophylactically as she endorses yeast infections with antibiotic use.  Viral testing deferred due to timeline of illness.May use additional over-the-counter medications as needed for supportive care.  May follow-up with urgent care as needed if symptoms persist or worsen.   Final Clinical Impressions(s) / UC Diagnoses   Final diagnoses:  Acute non-recurrent pansinusitis     Discharge Instructions      Today you are being treated for sinus infection  Begin Augmentin  every morning and every evening for 10 days, will take 48 hours to fully peak within the system but should see small improvement day by day May use Diflucan as needed    You can take Tylenol and/or Ibuprofen as needed for fever  reduction and pain relief.   For cough: honey 1/2 to 1 teaspoon (you can dilute the honey in water or another fluid).  You can also use guaifenesin and dextromethorphan for cough. You can use a humidifier for chest congestion and cough.  If you don't have a humidifier, you can sit in the bathroom with the hot shower running.      For sore throat: try warm salt water gargles, cepacol lozenges, throat spray, warm tea or water with lemon/honey, popsicles or ice, or OTC cold relief medicine for throat discomfort.   For congestion: take a daily anti-histamine like Zyrtec, Claritin, and a oral decongestant, such as pseudoephedrine.  You can also use Flonase 1-2 sprays in each nostril daily.   It is important to stay hydrated: drink plenty of fluids (water, gatorade/powerade/pedialyte, juices, or teas) to keep your throat moisturized and help further relieve irritation/discomfort.    ED Prescriptions     Medication Sig Dispense Auth. Provider   amoxicillin-clavulanate (AUGMENTIN) 875-125 MG tablet Take 1 tablet by mouth every 12 (twelve) hours for 10 days. 20 tablet Quantia Grullon R, NP   fluconazole (DIFLUCAN) 150 MG tablet Take 1 tablet (150 mg total) by mouth daily for 2 doses. 2 tablet Valinda Hoar, NP      PDMP not reviewed this encounter.   Valinda Hoar, NP 03/03/23 1142

## 2023-03-03 NOTE — Discharge Instructions (Addendum)
Today you are being treated for sinus infection  Begin Augmentin every morning and every evening for 10 days, will take 48 hours to fully peak within the system but should see small improvement day by day May use Diflucan as needed    You can take Tylenol and/or Ibuprofen as needed for fever reduction and pain relief.   For cough: honey 1/2 to 1 teaspoon (you can dilute the honey in water or another fluid).  You can also use guaifenesin and dextromethorphan for cough. You can use a humidifier for chest congestion and cough.  If you don't have a humidifier, you can sit in the bathroom with the hot shower running.      For sore throat: try warm salt water gargles, cepacol lozenges, throat spray, warm tea or water with lemon/honey, popsicles or ice, or OTC cold relief medicine for throat discomfort.   For congestion: take a daily anti-histamine like Zyrtec, Claritin, and a oral decongestant, such as pseudoephedrine.  You can also use Flonase 1-2 sprays in each nostril daily.   It is important to stay hydrated: drink plenty of fluids (water, gatorade/powerade/pedialyte, juices, or teas) to keep your throat moisturized and help further relieve irritation/discomfort.

## 2023-05-04 ENCOUNTER — Telehealth: Payer: Self-pay

## 2023-05-04 ENCOUNTER — Other Ambulatory Visit: Payer: Self-pay

## 2023-05-04 DIAGNOSIS — M5416 Radiculopathy, lumbar region: Secondary | ICD-10-CM

## 2023-05-04 MED ORDER — GABAPENTIN 300 MG PO CAPS: 300.0000 mg | ORAL_CAPSULE | Freq: Every day | ORAL | 1 refills | Status: DC

## 2023-05-04 MED ORDER — ROSUVASTATIN CALCIUM 40 MG PO TABS: 40.0000 mg | ORAL_TABLET | Freq: Every day | ORAL | 1 refills | Status: DC

## 2023-05-04 NOTE — Telephone Encounter (Signed)
Prescriptions sent to Pharmacy.

## 2023-05-04 NOTE — Telephone Encounter (Signed)
Prescription Request  05/04/2023  LOV: Visit date not found  What is the name of the medication or equipment? gabapentin (NEURONTIN) 300 MG capsule, metFORMIN (GLUCOPHAGE) 500 MG tablet, and rosuvastatin (CRESTOR) 40 MG tablet  Have you contacted your pharmacy to request a refill? No   Which pharmacy would you like this sent to?  [facility] [address] [place] - [address] [place] [address] Phone: [phone] Fax: [phone]    Patient notified that their request is being sent to the clinical staff for review and that they should receive a response within 2 business days.   Please advise at Mobile [phone] (mobile)  Patient states she no longer wants to use [facility], and would like for her medications to go to [facility] on [address]  Patient states she is out of the gabapentin and rosuvastatin and has no refills.  Patient states she is almost out of the rosuvastatin.  Patient states she would still like to have a 90-day supply of these medications.

## 2023-06-01 ENCOUNTER — Ambulatory Visit: Payer: BC Managed Care – PPO | Admitting: Family Medicine

## 2023-06-01 VITALS — BP 124/70 | HR 81 | Temp 98.5°F | Ht <= 58 in | Wt 135.2 lb

## 2023-06-01 DIAGNOSIS — E039 Hypothyroidism, unspecified: Secondary | ICD-10-CM

## 2023-06-01 DIAGNOSIS — Z7984 Long term (current) use of oral hypoglycemic drugs: Secondary | ICD-10-CM

## 2023-06-01 DIAGNOSIS — E119 Type 2 diabetes mellitus without complications: Secondary | ICD-10-CM | POA: Diagnosis not present

## 2023-06-01 DIAGNOSIS — F321 Major depressive disorder, single episode, moderate: Secondary | ICD-10-CM | POA: Diagnosis not present

## 2023-06-01 DIAGNOSIS — Z7985 Long-term (current) use of injectable non-insulin antidiabetic drugs: Secondary | ICD-10-CM

## 2023-06-01 DIAGNOSIS — I10 Essential (primary) hypertension: Secondary | ICD-10-CM | POA: Diagnosis not present

## 2023-06-01 MED ORDER — METFORMIN HCL 500 MG PO TABS: 500.0000 mg | ORAL_TABLET | Freq: Two times a day (BID) | ORAL | 1 refills | Status: DC

## 2023-06-01 MED ORDER — TRAZODONE HCL 50 MG PO TABS: 25.0000 mg | ORAL_TABLET | Freq: Every evening | ORAL | 0 refills | Status: DC | PRN

## 2023-06-01 NOTE — Assessment & Plan Note (Signed)
Chronic issue.  Adequately controlled.  Patient will continue lisinopril 20 mg daily and HCTZ 25 mg daily.

## 2023-06-01 NOTE — Assessment & Plan Note (Signed)
 Chronic issue.  Seems to be well-controlled.  Patient will continue Jardiance 25 mg daily, metformin 500 mg twice daily, and Mounjaro 10 mg weekly.

## 2023-06-01 NOTE — Assessment & Plan Note (Addendum)
 Chronic issue.  Asymptomatic currently.  Currently dealing with grief.  She will monitor and if she worsens in the depression she will let us know.  She will continue Lexapro 10 mg daily.  We will trial trazodone 25-50 mg nightly as needed for sleep.

## 2023-06-01 NOTE — Progress Notes (Signed)
 Brenda Her, MD Phone: [phone]  Brenda Farmer is a 58 y.o. female who presents today for f/u.  HYPERTENSION Disease Monitoring Home BP Monitoring not checking Chest pain- no    Dyspnea- no Medications Compliance-  taking hydrochlorothiazide , lisinopril .   Edema- no BMET    Component Value Date/Time   NA 140 05/24/2022 1620   NA 137 06/26/2014 0000   K 3.7 05/24/2022 1620   CL 100 05/24/2022 1620   CO2 27 05/24/2022 1620   GLUCOSE 124 (H) 05/24/2022 1620   BUN 18 05/24/2022 1620   BUN 14 06/26/2014 0000   CREATININE 0.65 05/24/2022 1620   CREATININE 0.86 10/28/2021 1629   CALCIUM  10.3 05/24/2022 1620   GFRNONAA >60 03/13/2022 1309   GFRAA 141 07/15/2007 1610   DIABETES Disease Monitoring: Blood Sugar ranges-<100 fasting Polyuria/phagia/dipsia- no      Optho- UTD Medications: Compliance- taking jardiance , metformin , mounjaro  Hypoglycemic symptoms- no  Grief: Patient notes about 20 days after Farmer last visit Farmer daughter passed away from drug overdose.  The patient's ex-husband found Farmer.  Patient notes she has been dealing with grief.  Notes she seems to be dealing with it adequately at this time other than she is not sleeping all that well.  She notes no depression.  She did see a camera operator.  She has been taking Advil PM to help with Farmer sleep.   Social History   Tobacco Use  Smoking Status Every Day   Current packs/day: 0.50   Average packs/day: 0.5 packs/day for 42.0 years (21.0 ttl pk-yrs)   Types: Cigarettes  Smokeless Tobacco Never  Tobacco Comments   1/2 pack per day    Current Outpatient Medications on File Prior to Visit  Medication Sig Dispense Refill   diclofenac  (VOLTAREN ) 75 MG EC tablet TAKE 1 TABLET TWICE A DAY AS NEEDED FOR MILD PAIN 180 tablet 1   escitalopram  (LEXAPRO ) 10 MG tablet TAKE 1 TABLET DAILY 90 tablet 3   gabapentin  (NEURONTIN ) 300 MG capsule Take 1 capsule (300 mg total) by mouth at bedtime. 90 capsule 1   glucose  blood (CONTOUR TEST) test strip Use as instructed 100 each 12   hydrochlorothiazide  (HYDRODIURIL ) 25 MG tablet TAKE 1 TABLET DAILY 90 tablet 3   JARDIANCE  25 MG TABS tablet TAKE 1 TABLET DAILY 90 tablet 3   levothyroxine  (SYNTHROID ) 125 MCG tablet Take 1 tablet (125 mcg total) by mouth daily. 90 tablet 1   lisinopril  (ZESTRIL ) 20 MG tablet TAKE 1 TABLET DAILY 90 tablet 1   Multiple Vitamin (MULTIVITAMIN) tablet Take 1 tablet by mouth daily.     rosuvastatin  (CRESTOR ) 40 MG tablet Take 1 tablet (40 mg total) by mouth daily. 90 tablet 1   tirzepatide  (MOUNJARO ) 10 MG/0.5ML Pen Inject 10 mg into the skin once a week. 6 mL 3   No current facility-administered medications on file prior to visit.     ROS see history of present illness  Objective  Physical Exam Vitals:   06/01/23 1155  BP: 124/70  Pulse: 81  Temp: 98.5 F (36.9 C)  SpO2: 95%    BP Readings from Last 3 Encounters:  06/01/23 124/70  03/03/23 115/72  11/24/22 116/78   Wt Readings from Last 3 Encounters:  06/01/23 135 lb 3.2 oz (61.3 kg)  11/24/22 131 lb (59.4 kg)  06/13/22 132 lb (59.9 kg)    Physical Exam Constitutional:      General: She is not in acute distress.    Appearance: She  is not diaphoretic.  Cardiovascular:     Rate and Rhythm: Normal rate and regular rhythm.     Heart sounds: Normal heart sounds.  Pulmonary:     Effort: Pulmonary effort is normal.     Breath sounds: Normal breath sounds.  Skin:    General: Skin is warm and dry.  Neurological:     Mental Status: She is alert.      Assessment/Plan: Please see individual problem list.  Benign essential HTN Assessment & Plan: Chronic issue.  Adequately controlled.  Patient will continue lisinopril  20 mg daily and HCTZ 25 mg daily.  Orders: -     Basic metabolic panel  Controlled type 2 diabetes mellitus without complication, without long-term current use of insulin  (HCC) Assessment & Plan: Chronic issue.  Seems to be well-controlled.   Patient will continue Jardiance  25 mg daily, metformin  500 mg twice daily, and Mounjaro  10 mg weekly.  Orders: -     metFORMIN  HCl; Take 1 tablet (500 mg total) by mouth 2 (two) times daily with a meal.  Dispense: 180 tablet; Refill: 1 -     Hemoglobin A1c  Depression, major, single episode, moderate (HCC) Assessment & Plan: Chronic issue.  Asymptomatic currently.  Currently dealing with grief.  She will monitor and if she worsens in the depression she will let us  know.  She will continue Lexapro  10 mg daily.  We will trial trazodone  25-50 mg nightly as needed for sleep.  Orders: -     traZODone  HCl; Take 0.5-1 tablets (25-50 mg total) by mouth at bedtime as needed for sleep.  Dispense: 30 tablet; Refill: 0  Hypothyroidism, unspecified type -     TSH     Health Maintenance: Patient will return for physical with new PCP and have Pap smear during that physical.  Return in about 3 months (around 08/30/2023) for Transfer of care/physical with Dr. Hope.   Brenda Her, MD [facility]

## 2023-06-01 NOTE — Patient Instructions (Signed)
 Nice to see you. If the trazodone makes you excessively drowsy please discontinue use of it.

## 2023-06-01 NOTE — Assessment & Plan Note (Signed)
>>  ASSESSMENT AND PLAN FOR DEPRESSION, MAJOR, SINGLE EPISODE, MODERATE (HCC) WRITTEN ON 06/01/2023 12:10 PM BY SONNENBERG, ERIC G, MD  Chronic issue.  Asymptomatic currently.  Currently dealing with grief.  She will monitor and if she worsens in the depression she will let us  know.  She will continue Lexapro  10 mg daily.  We will trial trazodone  25-50 mg nightly as needed for sleep.

## 2023-06-05 ENCOUNTER — Other Ambulatory Visit (INDEPENDENT_AMBULATORY_CARE_PROVIDER_SITE_OTHER): Payer: BC Managed Care – PPO

## 2023-06-05 ENCOUNTER — Telehealth: Payer: Self-pay | Admitting: Family Medicine

## 2023-06-05 DIAGNOSIS — E119 Type 2 diabetes mellitus without complications: Secondary | ICD-10-CM | POA: Diagnosis not present

## 2023-06-05 DIAGNOSIS — E039 Hypothyroidism, unspecified: Secondary | ICD-10-CM | POA: Diagnosis not present

## 2023-06-05 DIAGNOSIS — I10 Essential (primary) hypertension: Secondary | ICD-10-CM

## 2023-06-05 NOTE — Addendum Note (Signed)
 Addended by: Glori Luis on: 06/05/2023 08:41 AM   Modules accepted: Orders

## 2023-06-05 NOTE — Telephone Encounter (Signed)
 Patient need lab orders.

## 2023-06-05 NOTE — Telephone Encounter (Signed)
 Ordered

## 2023-06-06 ENCOUNTER — Other Ambulatory Visit: Payer: Self-pay | Admitting: Family Medicine

## 2023-06-06 DIAGNOSIS — E039 Hypothyroidism, unspecified: Secondary | ICD-10-CM

## 2023-06-06 LAB — BASIC METABOLIC PANEL
BUN: 32 mg/dL — ABNORMAL HIGH (ref 6–23)
CO2: 27 meq/L (ref 19–32)
Calcium: 9.7 mg/dL (ref 8.4–10.5)
Chloride: 98 meq/L (ref 96–112)
Creatinine, Ser: 0.67 mg/dL (ref 0.40–1.20)
GFR: 96.31 mL/min (ref >60.00)
Glucose, Bld: 92 mg/dL (ref 70–99)
Potassium: 3.7 meq/L (ref 3.5–5.1)
Sodium: 135 meq/L (ref 135–145)

## 2023-06-06 LAB — HEMOGLOBIN A1C: Hgb A1c MFr Bld: 6.6 % — ABNORMAL HIGH (ref 4.6–6.5)

## 2023-06-06 LAB — TSH: TSH: 5.56 u[IU]/mL — ABNORMAL HIGH (ref 0.35–5.50)

## 2023-06-06 MED ORDER — LEVOTHYROXINE SODIUM 137 MCG PO TABS: 137.0000 ug | ORAL_TABLET | Freq: Every day | ORAL | 1 refills | Status: DC

## 2023-06-25 ENCOUNTER — Other Ambulatory Visit: Payer: Self-pay | Admitting: Family Medicine

## 2023-06-25 DIAGNOSIS — F321 Major depressive disorder, single episode, moderate: Secondary | ICD-10-CM

## 2023-07-03 ENCOUNTER — Telehealth: Payer: Self-pay

## 2023-07-03 ENCOUNTER — Other Ambulatory Visit (HOSPITAL_COMMUNITY): Payer: Self-pay

## 2023-07-03 NOTE — Telephone Encounter (Signed)
Pharmacy Patient Advocate Encounter  Received notification from [facility] that Prior Authorization for Greggory Keen has been APPROVED from 06/03/2023 to 07/02/2024   PA #/Case ID/Reference #: [id number] KEY: [id number]  REQUEST CAME FROM CMM

## 2023-07-18 ENCOUNTER — Other Ambulatory Visit: Payer: BC Managed Care – PPO

## 2023-07-20 ENCOUNTER — Other Ambulatory Visit (INDEPENDENT_AMBULATORY_CARE_PROVIDER_SITE_OTHER): Payer: BC Managed Care – PPO

## 2023-07-20 DIAGNOSIS — E039 Hypothyroidism, unspecified: Secondary | ICD-10-CM

## 2023-07-20 LAB — TSH: TSH: 0.37 u[IU]/mL (ref 0.35–5.50)

## 2023-07-22 ENCOUNTER — Encounter: Payer: Self-pay | Admitting: Family

## 2023-07-25 ENCOUNTER — Other Ambulatory Visit: Payer: Self-pay

## 2023-07-25 DIAGNOSIS — F321 Major depressive disorder, single episode, moderate: Secondary | ICD-10-CM

## 2023-07-25 MED ORDER — TRAZODONE HCL 50 MG PO TABS
25.0000 mg | ORAL_TABLET | Freq: Every day | ORAL | 0 refills | Status: DC
Start: 2023-07-25 — End: 2023-10-02

## 2023-07-25 NOTE — Telephone Encounter (Signed)
 E fax received for Trazodone pt is scheduled a toc with walsh.

## 2023-08-31 ENCOUNTER — Other Ambulatory Visit: Payer: Self-pay

## 2023-08-31 DIAGNOSIS — E039 Hypothyroidism, unspecified: Secondary | ICD-10-CM

## 2023-08-31 MED ORDER — LEVOTHYROXINE SODIUM 137 MCG PO TABS: 137.0000 ug | ORAL_TABLET | Freq: Every day | ORAL | 1 refills | Status: DC

## 2023-09-14 ENCOUNTER — Ambulatory Visit: Payer: BC Managed Care – PPO | Admitting: Family Medicine

## 2023-09-28 ENCOUNTER — Other Ambulatory Visit: Payer: Self-pay

## 2023-09-28 DIAGNOSIS — F321 Major depressive disorder, single episode, moderate: Secondary | ICD-10-CM

## 2023-09-28 NOTE — Telephone Encounter (Unsigned)
 Copied from CRM [id number]. Topic: Clinical - Medication Refill >> Sep 28, 2023  1:50 PM Juleen Oakland F wrote: Medication: tirzepatide  (MOUNJARO ) 10 MG/0.5ML Pen  Has the patient contacted their pharmacy? Yes, pharmacy sent over request  (Agent: If no, request that the patient contact the pharmacy for the refill. If patient does not wish to contact the pharmacy document the reason why and proceed with request.) (Agent: If yes, when and what did the pharmacy advise?)  This is the patient's preferred pharmacy:   [facility] [address] [place] - [address] [place] [address] Phone: [phone] Fax: [phone]  Is this the correct pharmacy for this prescription? Yes If no, delete pharmacy and type the correct one.   Has the prescription been filled recently? Yes  Is the patient out of the medication? Yes  Has the patient been seen for an appointment in the last year OR does the patient have an upcoming appointment? Yes  Can we respond through MyChart? Yes  Agent: Please be advised that Rx refills may take up to 3 business days. We ask that you follow-up with your pharmacy.

## 2023-10-01 NOTE — Telephone Encounter (Signed)
 Patient called about her Mounjaro  prescription refill. This RN notified patient that it takes three business days.  Copied from CRM [id number]. Topic: Clinical - Medication Refill >> Oct 01, 2023  1:38 PM Luane Rumps D wrote: Patient called again, stated she is 3 days late on her mounjaro 

## 2023-10-02 ENCOUNTER — Other Ambulatory Visit: Payer: Self-pay | Admitting: Family

## 2023-10-02 DIAGNOSIS — F321 Major depressive disorder, single episode, moderate: Secondary | ICD-10-CM

## 2023-10-02 DIAGNOSIS — E1165 Type 2 diabetes mellitus with hyperglycemia: Secondary | ICD-10-CM

## 2023-10-02 MED ORDER — TIRZEPATIDE 10 MG/0.5ML ~~LOC~~ SOAJ: 10.0000 mg | SUBCUTANEOUS | 0 refills | Status: DC

## 2023-10-08 ENCOUNTER — Other Ambulatory Visit: Payer: Self-pay

## 2023-10-08 DIAGNOSIS — Z1231 Encounter for screening mammogram for malignant neoplasm of breast: Secondary | ICD-10-CM

## 2023-10-09 ENCOUNTER — Other Ambulatory Visit: Payer: Self-pay | Admitting: Acute Care

## 2023-10-09 DIAGNOSIS — Z122 Encounter for screening for malignant neoplasm of respiratory organs: Secondary | ICD-10-CM

## 2023-10-09 DIAGNOSIS — Z87891 Personal history of nicotine dependence: Secondary | ICD-10-CM

## 2023-10-09 DIAGNOSIS — F17210 Nicotine dependence, cigarettes, uncomplicated: Secondary | ICD-10-CM

## 2023-10-17 ENCOUNTER — Other Ambulatory Visit: Payer: Self-pay

## 2023-10-17 DIAGNOSIS — I10 Essential (primary) hypertension: Secondary | ICD-10-CM

## 2023-10-17 DIAGNOSIS — M5416 Radiculopathy, lumbar region: Secondary | ICD-10-CM

## 2023-10-17 DIAGNOSIS — E782 Mixed hyperlipidemia: Secondary | ICD-10-CM

## 2023-10-17 NOTE — Telephone Encounter (Unsigned)
 Copied from CRM [id number]. Topic: Clinical - Medication Refill >> Oct 17, 2023  8:46 AM Howard Macho wrote: Medication: lisinopril  (ZESTRIL ) 20 MG tablet. Gabapentin  and hydrochlorothiazide ([facility] sent a request ten days ago and the pharmacy has not heard a response back yet)  Has the patient contacted their pharmacy? Yes (Agent: If no, request that the patient contact the pharmacy for the refill. If patient does not wish to contact the pharmacy document the reason why and proceed with request.) (Agent: If yes, when and what did the pharmacy advise?)  This is the patient's preferred pharmacy:  [facility] [address], [place] - [address] [place] [address] Phone: [phone] Fax: [phone]  Is this the correct pharmacy for this prescription? Yes If no, delete pharmacy and type the correct one.   Has the prescription been filled recently? No  Is the patient out of the medication? Yes  Has the patient been seen for an appointment in the last year OR does the patient have an upcoming appointment? Yes  Can we respond through MyChart? Yes  Agent: Please be advised that Rx refills may take up to 3 business days. We ask that you follow-up with your pharmacy.

## 2023-10-18 MED ORDER — HYDROCHLOROTHIAZIDE 25 MG PO TABS: 25.0000 mg | ORAL_TABLET | Freq: Every day | ORAL | 0 refills | Status: DC

## 2023-10-18 MED ORDER — GABAPENTIN 300 MG PO CAPS: 300.0000 mg | ORAL_CAPSULE | Freq: Every day | ORAL | 0 refills | Status: DC

## 2023-10-18 MED ORDER — LISINOPRIL 20 MG PO TABS: 20.0000 mg | ORAL_TABLET | Freq: Every day | ORAL | 0 refills | Status: DC

## 2023-11-12 ENCOUNTER — Encounter: Payer: Self-pay | Admitting: *Deleted

## 2023-11-15 ENCOUNTER — Other Ambulatory Visit: Payer: Self-pay | Admitting: Nurse Practitioner

## 2023-11-15 ENCOUNTER — Ambulatory Visit: Payer: Self-pay

## 2023-11-15 MED ORDER — DICLOFENAC SODIUM 75 MG PO TBEC: 75.0000 mg | DELAYED_RELEASE_TABLET | Freq: Two times a day (BID) | ORAL | 3 refills | Status: AC | PRN

## 2023-11-15 NOTE — Telephone Encounter (Signed)
 No triage- Med Refill appt of Diclofenac . TOC appt 6/30 with Dr Abbey.        Copied from CRM [id number]. Topic: Clinical - Red Word Triage >> Nov 15, 2023  8:22 AM [name] wrote: Red Word that prompted transfer to Nurse Triage: has pain due do not being able to take diclofenac  (VOLTAREN ) 75 MG EC tablet [[id number]]

## 2023-11-15 NOTE — Telephone Encounter (Unsigned)
 Copied from CRM [id number]. Topic: Clinical - Prescription Issue >> Nov 15, 2023  8:20 AM [name] wrote: Reason for CRM: diclofenac  (VOLTAREN ) 75 MG EC tablet [[id number]]  patient can't go without her medication. It will cause her extreme pain. Patient is very upset that this is happening and is requesting an emergency supply until her appt on June 30th.

## 2023-11-15 NOTE — Telephone Encounter (Signed)
 Pt was informed that med was refilled and she asked did she need to keep tomorrows appt I informed her that if that is the only reason she was coming in then she can cancel because she has a TOC with Bair on 11-19-23 she said thank you and that she would like to have it cancelled, pt appt has been cancelled

## 2023-11-16 ENCOUNTER — Ambulatory Visit: Admitting: Nurse Practitioner

## 2023-11-19 ENCOUNTER — Ambulatory Visit (INDEPENDENT_AMBULATORY_CARE_PROVIDER_SITE_OTHER)

## 2023-11-19 ENCOUNTER — Ambulatory Visit
Admission: RE | Admit: 2023-11-19 | Discharge: 2023-11-19 | Disposition: A | Discharge status: Home / Self Care | Source: Ambulatory Visit | Attending: Acute Care | Admitting: Acute Care

## 2023-11-19 ENCOUNTER — Other Ambulatory Visit (HOSPITAL_COMMUNITY)
Admission: RE | Admit: 2023-11-19 | Discharge: 2023-11-19 | Disposition: A | Discharge status: Home / Self Care | Source: Ambulatory Visit

## 2023-11-19 ENCOUNTER — Ambulatory Visit
Admission: RE | Admit: 2023-11-19 | Discharge: 2023-11-19 | Disposition: A | Discharge status: Home / Self Care | Source: Ambulatory Visit

## 2023-11-19 VITALS — BP 100/60 | HR 88 | Temp 98.7°F | Ht 58.5 in | Wt 129.0 lb

## 2023-11-19 DIAGNOSIS — Z1231 Encounter for screening mammogram for malignant neoplasm of breast: Secondary | ICD-10-CM | POA: Diagnosis present

## 2023-11-19 DIAGNOSIS — Z0000 Encounter for general adult medical examination without abnormal findings: Secondary | ICD-10-CM | POA: Diagnosis not present

## 2023-11-19 DIAGNOSIS — Z122 Encounter for screening for malignant neoplasm of respiratory organs: Secondary | ICD-10-CM | POA: Insufficient documentation

## 2023-11-19 DIAGNOSIS — I10 Essential (primary) hypertension: Secondary | ICD-10-CM | POA: Diagnosis not present

## 2023-11-19 DIAGNOSIS — Z87891 Personal history of nicotine dependence: Secondary | ICD-10-CM | POA: Diagnosis present

## 2023-11-19 DIAGNOSIS — F39 Unspecified mood [affective] disorder: Secondary | ICD-10-CM

## 2023-11-19 DIAGNOSIS — E785 Hyperlipidemia, unspecified: Secondary | ICD-10-CM

## 2023-11-19 DIAGNOSIS — Z5181 Encounter for therapeutic drug level monitoring: Secondary | ICD-10-CM

## 2023-11-19 DIAGNOSIS — E782 Mixed hyperlipidemia: Secondary | ICD-10-CM | POA: Diagnosis not present

## 2023-11-19 DIAGNOSIS — E663 Overweight: Secondary | ICD-10-CM

## 2023-11-19 DIAGNOSIS — F5102 Adjustment insomnia: Secondary | ICD-10-CM | POA: Insufficient documentation

## 2023-11-19 DIAGNOSIS — R200 Anesthesia of skin: Secondary | ICD-10-CM

## 2023-11-19 DIAGNOSIS — E039 Hypothyroidism, unspecified: Secondary | ICD-10-CM

## 2023-11-19 DIAGNOSIS — E119 Type 2 diabetes mellitus without complications: Secondary | ICD-10-CM | POA: Diagnosis not present

## 2023-11-19 DIAGNOSIS — M67912 Unspecified disorder of synovium and tendon, left shoulder: Secondary | ICD-10-CM

## 2023-11-19 DIAGNOSIS — F17210 Nicotine dependence, cigarettes, uncomplicated: Secondary | ICD-10-CM | POA: Insufficient documentation

## 2023-11-19 DIAGNOSIS — Z1151 Encounter for screening for human papillomavirus (HPV): Secondary | ICD-10-CM | POA: Insufficient documentation

## 2023-11-19 DIAGNOSIS — Z124 Encounter for screening for malignant neoplasm of cervix: Secondary | ICD-10-CM

## 2023-11-19 DIAGNOSIS — F17218 Nicotine dependence, cigarettes, with other nicotine-induced disorders: Secondary | ICD-10-CM

## 2023-11-19 DIAGNOSIS — E1165 Type 2 diabetes mellitus with hyperglycemia: Secondary | ICD-10-CM

## 2023-11-19 DIAGNOSIS — R202 Paresthesia of skin: Secondary | ICD-10-CM

## 2023-11-19 MED ORDER — TRAZODONE HCL 50 MG PO TABS: 50.0000 mg | ORAL_TABLET | Freq: Every day | ORAL | 3 refills | Status: AC

## 2023-11-19 MED ORDER — ROSUVASTATIN CALCIUM 20 MG PO TABS: 20.0000 mg | ORAL_TABLET | Freq: Every day | ORAL | 3 refills | Status: AC

## 2023-11-19 MED ORDER — METFORMIN HCL 500 MG PO TABS: 500.0000 mg | ORAL_TABLET | Freq: Two times a day (BID) | ORAL | 3 refills | Status: AC

## 2023-11-19 MED ORDER — CONTOUR TEST VI STRP: ORAL_STRIP | 12 refills | Status: AC

## 2023-11-19 MED ORDER — EMPAGLIFLOZIN 25 MG PO TABS: 25.0000 mg | ORAL_TABLET | Freq: Every day | ORAL | 3 refills | Status: AC

## 2023-11-19 MED ORDER — ESCITALOPRAM OXALATE 10 MG PO TABS: 10.0000 mg | ORAL_TABLET | Freq: Every day | ORAL | 3 refills | Status: AC

## 2023-11-19 MED ORDER — GABAPENTIN 300 MG PO CAPS: 300.0000 mg | ORAL_CAPSULE | Freq: Every day | ORAL | 3 refills | Status: AC

## 2023-11-19 MED ORDER — TIRZEPATIDE 10 MG/0.5ML ~~LOC~~ SOAJ: 10.0000 mg | SUBCUTANEOUS | 3 refills | Status: AC

## 2023-11-19 MED ORDER — LISINOPRIL 20 MG PO TABS: 20.0000 mg | ORAL_TABLET | Freq: Every day | ORAL | 3 refills | Status: AC

## 2023-11-19 MED ORDER — LEVOTHYROXINE SODIUM 137 MCG PO TABS: 137.0000 ug | ORAL_TABLET | Freq: Every day | ORAL | 3 refills | Status: AC

## 2023-11-19 NOTE — Assessment & Plan Note (Signed)
 Stable on diclofenac  75 mg twice a day. Discussed risks related to prolong use of NSAIDs including GI/nephrotoxicity. Also discussed considering switching to Meloxicam 7.5 mg once a day in the future. If renal function stable will recommend f/u in 6 months with BMP.

## 2023-11-19 NOTE — Assessment & Plan Note (Signed)
 Check A1c, urine microalbumin today.  Continue Metformin  500 mg (2 tab) twice a day, Jardiance  25 mg daily, Mounjaro  10 mg, once weekly injection.

## 2023-11-19 NOTE — Assessment & Plan Note (Signed)
 Check TSH, T4 today. Refill sent on Levothyroxine  137 mcg daily. Will update patient with lab results and if medication adjustment needs to be made.

## 2023-11-19 NOTE — Assessment & Plan Note (Signed)
 Chronic issue. Stable on Gabapentin  300 mg at at bedtime. Continue. Has been seen by neurosurgery in the past with h/o 2 spinal surgery.

## 2023-11-19 NOTE — Progress Notes (Signed)
 Established Patient Office Visit TOC from Dr. Maribeth (last OV with him was on 06/01/23)   Subjective  Patient ID: Brenda Farmer, female    DOB: [birth year]  Age: 58 y.o. 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  HPI - HTN: Hydrochlorothiazide :Lisinopril , 06/05/23. She does not check BP at home. No lower leg edema, chest pain. On hydrochlorothiazide  25 mg, Lisinopril  20 mg daily.   - Hypothyroidism: On Levothyroxine  137 mcg (TSH 07/20/23 0.27 after it was 5.56 on 06/05/23). She takes it first thing in the morning.   - Type II DM: 6.6% on 06/05/23. On Mounjaro  10 mg/0.97ml, once weekly. On Jardiance  25 mg. Metformin  500 mg (2 tab, BID). She checks home BG mostly in the morning ranging in 100-120.   - Mood disorder: Escitalopram /Lexapro  10 mg which was started after she lost her daughter last year. She gets teary talking about her. No SI/HI.   - Insomnia: She goes to bed around 9 PM at the same time. She wakes up around 2-3 AM and has hard time falling back asleep. She drinks coffee around 2 PM during the day time and occasionally takes nap. She is taking Trazodone  50 mg at night. She does not feel groggy in the morning.   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 She is taking Gabapentin  300 mg at bedtime. She reports pain is stable with Gabapentin .    - Left shoulder pain: She also takes Diclofenac  75 mg twice a day. Helps with back pain as well.   - Hyperlipidemia: She is taking Rosuvastatin  20 mg (1/2 of 40 mg tab daily).   - Nicotine use: Has been smoking since she was 58 years of age. Had low dose CT chest done earlier today. She smokes 1/2 a pack per day. She denies coughing, wheezing. Qualifies for pneumonia vaccine.    ROS As per HPI    Objective:     BP 100/60 (BP Location: Right Arm, Cuff Size: Normal)   Pulse 88   Temp 98.7 F (37.1 C) (Oral)   Ht 4' 10.5 (1.486 m)   Wt 129 lb (58.5 kg)   SpO2 95%   BMI 26.50 kg/m      11/19/2023    3:01 PM 11/24/2022    3:26 PM 05/24/2022    4:11 PM  Depression screen PHQ 2/9  Decreased Interest 0 0 0  Down, Depressed, Hopeless 0 0 0  PHQ - 2 Score 0 0 0  Altered sleeping 2 0 0  Tired, decreased energy 1 0 0  Change in appetite 0 0 0  Feeling bad or failure about yourself  0 0 0  Trouble concentrating 0 0 0  Moving slowly or fidgety/restless 0 0 0  Suicidal thoughts 0 0 0  PHQ-9 Score 3 0 0  Difficult doing work/chores Not difficult at all Not difficult at all Not difficult at all      11/19/2023    3:02 PM 11/24/2022    3:27 PM  05/24/2022    4:11 PM  GAD 7 : Generalized Anxiety Score  Nervous, Anxious, on Edge 0 0 0  Control/stop worrying 0 0 0  Worry too much - different things 0 0 0  Trouble relaxing 1 0 0  Restless 0 0 0  Easily annoyed or irritable 0 0 0  Afraid - awful might happen 0 0 0  Total GAD 7 Score 1 0 0  Anxiety Difficulty Not difficult at all Not difficult at all Not difficult at all      11/19/2023    3:01 PM 11/24/2022    3:26 PM 05/24/2022    4:11 PM  Depression screen PHQ 2/9  Decreased Interest 0 0 0  Down, Depressed, Hopeless 0 0 0  PHQ - 2 Score 0 0 0  Altered sleeping 2 0 0  Tired, decreased energy 1 0 0  Change in appetite 0 0 0  Feeling bad or failure about yourself  0 0 0  Trouble concentrating 0 0 0  Moving slowly or  fidgety/restless 0 0 0  Suicidal thoughts 0 0 0  PHQ-9 Score 3 0 0  Difficult doing work/chores Not difficult at all Not difficult at all Not difficult at all      11/19/2023    3:02 PM 11/24/2022    3:27 PM 05/24/2022    4:11 PM  GAD 7 : Generalized Anxiety Score  Nervous, Anxious, on Edge 0 0 0  Control/stop worrying 0 0 0  Worry too much - different things 0 0 0  Trouble relaxing 1 0 0  Restless 0 0 0  Easily annoyed or irritable 0 0 0  Afraid - awful might happen 0 0 0  Total GAD 7 Score 1 0 0  Anxiety Difficulty Not difficult at all Not difficult at all Not difficult at all   SDOH Screenings   Food Insecurity: No Food Insecurity (11/18/2023)  Housing: Unknown (11/18/2023)  Transportation Needs: No Transportation Needs (11/18/2023)  Alcohol Screen: Low Risk  (11/18/2023)  Depression (PHQ2-9): Low Risk  (11/19/2023)  Financial Resource Strain: Low Risk  (11/18/2023)  Physical Activity: Insufficiently Active (11/18/2023)  Social Connections: Moderately Isolated (11/18/2023)  Stress: No Stress Concern Present (11/18/2023)  Tobacco Use: High Risk (11/19/2023)     Physical Exam Exam conducted with a chaperone present.  Constitutional:      Appearance: Normal appearance.  HENT:     Head: Normocephalic and atraumatic.     Right Ear: Tympanic membrane normal.     Left Ear: Tympanic membrane normal.     Mouth/Throat:     Mouth: Mucous membranes are moist.  Neck:     Thyroid : No thyroid  mass or thyroid  tenderness.   Cardiovascular:     Rate and Rhythm: Normal rate and regular rhythm.  Pulmonary:     Effort: Pulmonary effort is normal.     Breath sounds: Normal breath sounds.  Abdominal:     General: Bowel sounds are normal.     Palpations: Abdomen is soft.     Tenderness: There is no right CVA tenderness, left CVA tenderness or guarding.  Genitourinary:    Labia:        Right: No rash.        Left: No rash.      Cervix: No discharge, friability or cervical bleeding.      Comments: PAP done  Musculoskeletal:     Cervical back: Neck supple. No rigidity.     Right lower leg: No edema.  Left lower leg: No edema.   Skin:    General: Skin is warm.   Neurological:     Mental Status: She is alert and oriented to person, place, and time.   Psychiatric:        Mood and Affect: Mood normal.        Behavior: Behavior normal.        No results found for any visits on 11/19/23.  The 10-year ASCVD risk score (Arnett DK, et al., 2019) is: 8.9%     Assessment & Plan:   Annual physical exam Assessment & Plan: Annual physical exam done.  PAP updated.  Patient due for pneumonia immunization, she plans on getting this through local pharmacy.    Controlled type 2 diabetes mellitus without complication, without long-term current use of insulin  (HCC) Assessment & Plan: Check A1c, urine microalbumin today.  Continue Metformin  500 mg (2 tab) twice a day, Jardiance  25 mg daily, Mounjaro  10 mg, once weekly injection.   Orders: -     metFORMIN  HCl; Take 1 tablet (500 mg total) by mouth 2 (two) times daily with a meal.  Dispense: 180 tablet; Refill: 3 -     Tirzepatide ; Inject 10 mg into the skin once a week.  Dispense: 6 mL; Refill: 3 -     Microalbumin / creatinine urine ratio -     Hemoglobin A1c -     Empagliflozin ; Take 1 tablet (25 mg total) by mouth daily.  Dispense: 90 tablet; Refill: 3 -     Contour Test; Use as instructed  Dispense: 100 each; Refill: 12  Benign essential HTN Assessment & Plan: BP low today.  D/C hydrochlorothiazide .  Continue Lisinopril  20 mg daily.  Check home Blood pressure 3 times a week, goal BP <130/80 mmHg.  Will adjust medication if BP elevated.   Orders: -     Comprehensive metabolic panel with GFR -     Lisinopril ; Take 1 tablet (20 mg total) by mouth daily.  Dispense: 90 tablet; Refill: 3  Screening for HPV (human papillomavirus) Assessment & Plan: Annual physical exam done.  PAP updated.  Patient due for  pneumonia immunization, she plans on getting this through local pharmacy.   Orders: -     Cytology - PAP  Pap smear for cervical cancer screening Assessment & Plan: Annual physical exam done.  PAP updated.  Patient due for pneumonia immunization, she plans on getting this through local pharmacy.   Orders: -     Cytology - PAP  Medication monitoring encounter Assessment & Plan: Annual physical exam done.  PAP updated.  Patient due for pneumonia immunization, she plans on getting this through local pharmacy.   Orders: -     Vitamin B12  Acquired hypothyroidism Assessment & Plan: Check TSH, T4 today. Refill sent on Levothyroxine  137 mcg daily. Will update patient with lab results and if medication adjustment needs to be made.   Orders: -     T4, free -     TSH -     Levothyroxine  Sodium; Take 1 tablet (137 mcg total) by mouth daily.  Dispense: 90 tablet; Refill: 3  Mood disorder (HCC) Assessment & Plan: Stable on Escitalopram  20 mg, continue, refill sent.   Orders: -     Escitalopram  Oxalate; Take 1 tablet (10 mg total) by mouth daily.  Dispense: 90 tablet; Refill: 3  Tendinopathy of left rotator cuff Assessment & Plan: Stable on diclofenac  75 mg twice a day. Discussed risks related to prolong  use of NSAIDs including GI/nephrotoxicity. Also discussed considering switching to Meloxicam 7.5 mg once a day in the future. If renal function stable will recommend f/u in 6 months with BMP.    Numbness and tingling of right distal lateral thigh Assessment & Plan: Chronic issue. Stable on Gabapentin  300 mg at at bedtime. Continue. Has been seen by neurosurgery in the past with h/o 2 spinal surgery.   Orders: -     Gabapentin ; Take 1 capsule (300 mg total) by mouth at bedtime.  Dispense: 90 capsule; Refill: 3  Nicotine dependence, cigarettes, with other nicotine-induced disorders Assessment & Plan: Patient is motivated to in reducing, quitting smoking through her work smoking  cessation program. Will follow up during her next visit as well.    Mixed hyperlipidemia Assessment & Plan: Continue Rosuvastatin  20 mg daily. Tolerating well. Will check lipid panel today.   Orders: -     Rosuvastatin  Calcium ; Take 1 tablet (20 mg total) by mouth daily.  Dispense: 90 tablet; Refill: 3 -     Lipid panel -     Lisinopril ; Take 1 tablet (20 mg total) by mouth daily.  Dispense: 90 tablet; Refill: 3  Insomnia due to stress Assessment & Plan: Continue Trazodone  50 mg at bed time. Improving sleep hygiene, cutting down on caffeine intake, going to bed an hour before current bedtime discussed.   Orders: -     traZODone  HCl; Take 1 tablet (50 mg total) by mouth at bedtime.  Dispense: 90 tablet; Refill: 3  Overweight Assessment & Plan: Body mass index is 26.5 kg/m. Compared to 28.26 in 05/2023. Encouraged on continuing with diet, lifestyle modification.      Return in about 6 months (around 05/20/2024) for Chronic .   Luke Shade, MD

## 2023-11-19 NOTE — Assessment & Plan Note (Signed)
 BP low today.  D/C hydrochlorothiazide .  Continue Lisinopril  20 mg daily.  Check home Blood pressure 3 times a week, goal BP <130/80 mmHg.  Will adjust medication if BP elevated.

## 2023-11-19 NOTE — Assessment & Plan Note (Signed)
 Chronic issue.  Stable on gabapentin  300 mg nightly. No S/E. PDMP reviewed, refill sent.

## 2023-11-19 NOTE — Assessment & Plan Note (Signed)
 Continue Trazodone  50 mg at bed time. Improving sleep hygiene, cutting down on caffeine intake, going to bed an hour before current bedtime discussed.

## 2023-11-19 NOTE — Assessment & Plan Note (Signed)
 Continue Rosuvastatin  20 mg daily. Tolerating well. Will check lipid panel today.

## 2023-11-19 NOTE — Assessment & Plan Note (Signed)
 Body mass index is 26.5 kg/m. Compared to 28.26 in 05/2023. Encouraged on continuing with diet, lifestyle modification.

## 2023-11-19 NOTE — Assessment & Plan Note (Signed)
 Stable on Escitalopram  20 mg, continue, refill sent.

## 2023-11-19 NOTE — Assessment & Plan Note (Addendum)
 Annual physical exam done.  PAP updated.  Patient due for pneumonia immunization, she plans on getting this through local pharmacy.

## 2023-11-19 NOTE — Assessment & Plan Note (Signed)
 Patient is motivated to in reducing, quitting smoking through her work smoking cessation program. Will follow up during her next visit as well.

## 2023-11-20 LAB — COMPREHENSIVE METABOLIC PANEL WITH GFR
ALT: 25 U/L (ref 0–35)
AST: 19 U/L (ref 0–37)
Albumin: 4.6 g/dL (ref 3.5–5.2)
Alkaline Phosphatase: 61 U/L (ref 39–117)
BUN: 17 mg/dL (ref 6–23)
CO2: 30 meq/L (ref 19–32)
Calcium: 10 mg/dL (ref 8.4–10.5)
Chloride: 101 meq/L (ref 96–112)
Creatinine, Ser: 0.75 mg/dL (ref 0.40–1.20)
GFR: 87.45 mL/min (ref >60.00)
Glucose, Bld: 158 mg/dL — ABNORMAL HIGH (ref 70–99)
Potassium: 3.8 meq/L (ref 3.5–5.1)
Sodium: 140 meq/L (ref 135–145)
Total Bilirubin: 0.4 mg/dL (ref 0.2–1.2)
Total Protein: 6.5 g/dL (ref 6.0–8.3)

## 2023-11-20 LAB — LIPID PANEL
Cholesterol: 173 mg/dL (ref 0–200)
HDL: 43.7 mg/dL (ref >39.00)
LDL Cholesterol: 90 mg/dL (ref 0–99)
NonHDL: 129.15
Total CHOL/HDL Ratio: 4
Triglycerides: 197 mg/dL — ABNORMAL HIGH (ref 0.0–149.0)
VLDL: 39.4 mg/dL (ref 0.0–40.0)

## 2023-11-20 LAB — MICROALBUMIN / CREATININE URINE RATIO
Creatinine,U: 36.5 mg/dL
Microalb Creat Ratio: UNDETERMINED mg/g (ref 0.0–30.0)
Microalb, Ur: <0.7 mg/dL

## 2023-11-20 LAB — TSH: TSH: 0.47 u[IU]/mL (ref 0.35–5.50)

## 2023-11-20 LAB — VITAMIN B12: Vitamin B-12: 838 pg/mL (ref 211–911)

## 2023-11-20 LAB — T4, FREE: Free T4: 1.14 ng/dL (ref 0.60–1.60)

## 2023-11-20 LAB — HEMOGLOBIN A1C: Hgb A1c MFr Bld: 6.6 % — ABNORMAL HIGH (ref 4.6–6.5)

## 2023-11-21 ENCOUNTER — Ambulatory Visit: Payer: Self-pay

## 2023-11-21 NOTE — Progress Notes (Signed)
 Please let the patient know her recent lab shows stable A1c. Normal: urine microalbumin, B12, thyroid  hormone, kidney, liver function. Lipid panel showed mildly elevated triglycerides, recommend cutting down on carbohydrate consumption, regular exercise. Continue current medications without change.   Thank you,  Luke Shade, MD

## 2023-11-22 LAB — CYTOLOGY - PAP
Comment: NEGATIVE
Diagnosis: NEGATIVE
High risk HPV: NEGATIVE

## 2023-12-24 ENCOUNTER — Other Ambulatory Visit: Payer: Self-pay | Admitting: Acute Care

## 2023-12-24 DIAGNOSIS — Z122 Encounter for screening for malignant neoplasm of respiratory organs: Secondary | ICD-10-CM

## 2023-12-24 DIAGNOSIS — Z87891 Personal history of nicotine dependence: Secondary | ICD-10-CM

## 2023-12-24 DIAGNOSIS — F17210 Nicotine dependence, cigarettes, uncomplicated: Secondary | ICD-10-CM

## 2024-02-04 ENCOUNTER — Other Ambulatory Visit: Payer: Self-pay

## 2024-02-04 DIAGNOSIS — Z23 Encounter for immunization: Secondary | ICD-10-CM

## 2024-02-04 MED ORDER — COVID-19 MRNA VAC-TRIS(PFIZER) 30 MCG/0.3ML IM SUSY: 0.3000 mL | PREFILLED_SYRINGE | Freq: Once | INTRAMUSCULAR | 0 refills | Status: AC

## 2024-06-12 ENCOUNTER — Telehealth: Payer: Self-pay

## 2024-06-12 ENCOUNTER — Other Ambulatory Visit (HOSPITAL_COMMUNITY): Payer: Self-pay

## 2024-06-12 NOTE — Telephone Encounter (Signed)
 Pharmacy Patient Advocate Encounter   Received notification from Physician's Office that prior authorization for Mounjaro  10MG /0.5ML auto-injectors is required/requested.   Insurance verification completed.   The patient is insured through [employer].   Per test claim: The current 84 day co-pay is, $25.  No PA needed at this time. This test claim was processed through [facility]- copay amounts may vary at other pharmacies due to pharmacy/plan contracts, or as the patient moves through the different stages of their insurance plan.

## 2024-06-16 ENCOUNTER — Other Ambulatory Visit (HOSPITAL_COMMUNITY): Payer: Self-pay

## 2024-06-19 ENCOUNTER — Ambulatory Visit

## 2024-06-24 ENCOUNTER — Ambulatory Visit

## 2024-06-24 VITALS — BP 122/60 | HR 84 | Temp 98.7°F | Ht 58.5 in | Wt 115.4 lb

## 2024-06-24 DIAGNOSIS — Z7985 Long-term (current) use of injectable non-insulin antidiabetic drugs: Secondary | ICD-10-CM

## 2024-06-24 DIAGNOSIS — Z7984 Long term (current) use of oral hypoglycemic drugs: Secondary | ICD-10-CM

## 2024-06-24 DIAGNOSIS — M67912 Unspecified disorder of synovium and tendon, left shoulder: Secondary | ICD-10-CM

## 2024-06-24 DIAGNOSIS — I152 Hypertension secondary to endocrine disorders: Secondary | ICD-10-CM

## 2024-06-24 DIAGNOSIS — E039 Hypothyroidism, unspecified: Secondary | ICD-10-CM

## 2024-06-24 DIAGNOSIS — G4733 Obstructive sleep apnea (adult) (pediatric): Secondary | ICD-10-CM

## 2024-06-24 DIAGNOSIS — E1159 Type 2 diabetes mellitus with other circulatory complications: Secondary | ICD-10-CM

## 2024-06-24 DIAGNOSIS — E118 Type 2 diabetes mellitus with unspecified complications: Secondary | ICD-10-CM

## 2024-06-24 DIAGNOSIS — E782 Mixed hyperlipidemia: Secondary | ICD-10-CM

## 2024-06-24 DIAGNOSIS — F39 Unspecified mood [affective] disorder: Secondary | ICD-10-CM

## 2024-06-24 LAB — COMPREHENSIVE METABOLIC PANEL WITH GFR
ALT: 19 U/L (ref 3–35)
AST: 19 U/L (ref 5–37)
Albumin: 4.6 g/dL (ref 3.5–5.2)
Alkaline Phosphatase: 64 U/L (ref 39–117)
BUN: 21 mg/dL (ref 6–23)
CO2: 29 meq/L (ref 19–32)
Calcium: 10.2 mg/dL (ref 8.4–10.5)
Chloride: 103 meq/L (ref 96–112)
Creatinine, Ser: 0.61 mg/dL (ref 0.40–1.20)
GFR: 97.79 mL/min
Glucose, Bld: 84 mg/dL (ref 70–99)
Potassium: 4.6 meq/L (ref 3.5–5.1)
Sodium: 140 meq/L (ref 135–145)
Total Bilirubin: 0.3 mg/dL (ref 0.2–1.2)
Total Protein: 6.8 g/dL (ref 6.0–8.3)

## 2024-06-24 LAB — TSH: TSH: 0.71 u[IU]/mL (ref 0.35–5.50)

## 2024-06-24 LAB — MICROALBUMIN / CREATININE URINE RATIO
Creatinine,U: 74.2 mg/dL
Microalb Creat Ratio: 14.4 mg/g (ref 0.0–30.0)
Microalb, Ur: 1.1 mg/dL (ref 0.7–1.9)

## 2024-06-24 LAB — HEMOGLOBIN A1C: Hgb A1c MFr Bld: 6.4 % (ref 4.6–6.5)

## 2024-06-24 NOTE — Assessment & Plan Note (Signed)
 Complications associated with type 2 diabetes includes hypertension, hyperlipidemia, peripheral neuropathy, urine microalbuminuria. Management with Jardiance  25 mg daily, Mounjaro  10 mg weekly, metformin  500 mg twice a day.  Continue. Check urine microalbumin, A1c, CMP today. Consider dose reduction of metformin  from 500 mg twice a day to once a day and Mounjaro  dose reduction from 10 mg weekly to 7.5 mg weekly if labs reassuring. Continue gabapentin  300 mg daily. Orders:   Urine Microalbumin w/creat. ratio   HgB A1c   Comp Met (CMET)

## 2024-06-24 NOTE — Assessment & Plan Note (Addendum)
 Chronic, managed with levothyroxine  137 mcg which she takes first thing in the morning.  Continue will check TSH today. Orders:   TSH

## 2024-06-24 NOTE — Assessment & Plan Note (Signed)
 Stable on escitalopram  10 mg daily, trazodone  50 mg at bedtime helping with sleep.  Continue.

## 2024-06-24 NOTE — Assessment & Plan Note (Deleted)
" °  Orders:   Urine Microalbumin w/creat. ratio   HgB A1c   Comp Met (CMET)  "

## 2024-06-24 NOTE — Assessment & Plan Note (Signed)
 Blood pressure within goal, continue lisinopril  20 mg daily.

## 2024-06-24 NOTE — Assessment & Plan Note (Addendum)
 Managed with rosuvastatin  20 mg daily.  Continue. Orders:   Comp Met (CMET)

## 2024-06-26 ENCOUNTER — Ambulatory Visit: Payer: Self-pay

## 2024-07-02 ENCOUNTER — Ambulatory Visit: Admitting: Sleep Medicine

## 2024-12-19 ENCOUNTER — Ambulatory Visit
